# Patient Record
Sex: Female | Born: 1945
Health system: Southern US, Community
[De-identification: ages and names within clinical notes are randomized; demographics above are authoritative.]

## PROBLEM LIST (undated history)

## (undated) DIAGNOSIS — L509 Urticaria, unspecified: Secondary | ICD-10-CM

## (undated) DIAGNOSIS — F419 Anxiety disorder, unspecified: Secondary | ICD-10-CM

## (undated) DIAGNOSIS — M109 Gout, unspecified: Secondary | ICD-10-CM

## (undated) DIAGNOSIS — I10 Essential (primary) hypertension: Secondary | ICD-10-CM

## (undated) DIAGNOSIS — J45909 Unspecified asthma, uncomplicated: Secondary | ICD-10-CM

## (undated) DIAGNOSIS — M1990 Unspecified osteoarthritis, unspecified site: Secondary | ICD-10-CM

## (undated) DIAGNOSIS — K219 Gastro-esophageal reflux disease without esophagitis: Secondary | ICD-10-CM

## (undated) DIAGNOSIS — C801 Malignant (primary) neoplasm, unspecified: Secondary | ICD-10-CM

## (undated) DIAGNOSIS — T783XXA Angioneurotic edema, initial encounter: Secondary | ICD-10-CM

## (undated) HISTORY — PX: APPENDECTOMY: SHX54

## (undated) HISTORY — DX: Angioneurotic edema, initial encounter: T78.3XXA

## (undated) HISTORY — DX: Essential (primary) hypertension: I10

## (undated) HISTORY — PX: HEMORRHOID SURGERY: SHX153

## (undated) HISTORY — DX: Gastro-esophageal reflux disease without esophagitis: K21.9

## (undated) HISTORY — DX: Urticaria, unspecified: L50.9

## (undated) HISTORY — PX: BACK SURGERY: SHX140

## (undated) HISTORY — PX: ABDOMINAL HYSTERECTOMY: SHX81

## (undated) HISTORY — PX: BREAST CYST EXCISION: SHX579

---

## 2000-09-06 ENCOUNTER — Ambulatory Visit (HOSPITAL_COMMUNITY): Admission: RE | Admit: 2000-09-06 | Discharge: 2000-09-06 | Payer: Self-pay | Admitting: General Surgery

## 2001-03-24 ENCOUNTER — Encounter: Payer: Self-pay | Admitting: Family Medicine

## 2001-03-24 ENCOUNTER — Ambulatory Visit (HOSPITAL_COMMUNITY): Admission: RE | Admit: 2001-03-24 | Discharge: 2001-03-24 | Payer: Self-pay | Admitting: Family Medicine

## 2002-02-10 ENCOUNTER — Encounter (HOSPITAL_COMMUNITY): Admission: RE | Admit: 2002-02-10 | Discharge: 2002-03-12 | Payer: Self-pay | Admitting: Preventative Medicine

## 2002-06-05 ENCOUNTER — Emergency Department (HOSPITAL_COMMUNITY): Admission: EM | Admit: 2002-06-05 | Discharge: 2002-06-05 | Payer: Self-pay | Admitting: Emergency Medicine

## 2002-06-05 ENCOUNTER — Encounter: Payer: Self-pay | Admitting: *Deleted

## 2002-12-25 ENCOUNTER — Encounter: Payer: Self-pay | Admitting: Family Medicine

## 2002-12-25 ENCOUNTER — Ambulatory Visit (HOSPITAL_COMMUNITY): Admission: RE | Admit: 2002-12-25 | Discharge: 2002-12-25 | Payer: Self-pay | Admitting: Family Medicine

## 2003-01-25 ENCOUNTER — Ambulatory Visit (HOSPITAL_COMMUNITY): Admission: RE | Admit: 2003-01-25 | Discharge: 2003-01-25 | Payer: Self-pay | Admitting: Family Medicine

## 2003-01-25 ENCOUNTER — Encounter: Payer: Self-pay | Admitting: Family Medicine

## 2003-06-01 ENCOUNTER — Ambulatory Visit (HOSPITAL_COMMUNITY): Admission: RE | Admit: 2003-06-01 | Discharge: 2003-06-01 | Payer: Self-pay | Admitting: Family Medicine

## 2003-08-23 ENCOUNTER — Other Ambulatory Visit: Admission: RE | Admit: 2003-08-23 | Discharge: 2003-08-23 | Payer: Self-pay | Admitting: Dermatology

## 2004-01-26 ENCOUNTER — Ambulatory Visit (HOSPITAL_COMMUNITY): Admission: RE | Admit: 2004-01-26 | Discharge: 2004-01-26 | Payer: Self-pay | Admitting: Family Medicine

## 2004-09-07 ENCOUNTER — Other Ambulatory Visit: Admission: RE | Admit: 2004-09-07 | Discharge: 2004-09-07 | Payer: Self-pay | Admitting: Dermatology

## 2005-02-22 ENCOUNTER — Ambulatory Visit (HOSPITAL_COMMUNITY): Admission: RE | Admit: 2005-02-22 | Discharge: 2005-02-22 | Payer: Self-pay | Admitting: Family Medicine

## 2005-05-15 ENCOUNTER — Ambulatory Visit (HOSPITAL_COMMUNITY): Admission: RE | Admit: 2005-05-15 | Discharge: 2005-05-15 | Payer: Self-pay | Admitting: Family Medicine

## 2006-03-19 ENCOUNTER — Ambulatory Visit (HOSPITAL_COMMUNITY): Admission: RE | Admit: 2006-03-19 | Discharge: 2006-03-19 | Payer: Self-pay | Admitting: Internal Medicine

## 2007-09-04 ENCOUNTER — Ambulatory Visit: Payer: Self-pay | Admitting: Internal Medicine

## 2007-09-16 ENCOUNTER — Ambulatory Visit (HOSPITAL_COMMUNITY): Admission: RE | Admit: 2007-09-16 | Discharge: 2007-09-16 | Payer: Self-pay | Admitting: Internal Medicine

## 2007-09-16 ENCOUNTER — Ambulatory Visit: Payer: Self-pay | Admitting: Internal Medicine

## 2007-09-16 HISTORY — PX: COLONOSCOPY: SHX174

## 2007-09-16 HISTORY — PX: ESOPHAGOGASTRODUODENOSCOPY: SHX1529

## 2008-10-21 ENCOUNTER — Encounter: Payer: Self-pay | Admitting: Gastroenterology

## 2008-12-15 ENCOUNTER — Ambulatory Visit (HOSPITAL_COMMUNITY): Admission: RE | Admit: 2008-12-15 | Discharge: 2008-12-15 | Payer: Self-pay | Admitting: General Surgery

## 2008-12-15 ENCOUNTER — Encounter (INDEPENDENT_AMBULATORY_CARE_PROVIDER_SITE_OTHER): Payer: Self-pay | Admitting: General Surgery

## 2010-05-03 ENCOUNTER — Ambulatory Visit (HOSPITAL_COMMUNITY)
Admission: RE | Admit: 2010-05-03 | Discharge: 2010-05-03 | Payer: Self-pay | Source: Home / Self Care | Attending: Internal Medicine | Admitting: Internal Medicine

## 2010-05-04 ENCOUNTER — Ambulatory Visit (HOSPITAL_COMMUNITY)
Admission: RE | Admit: 2010-05-04 | Discharge: 2010-05-04 | Payer: Self-pay | Source: Home / Self Care | Attending: Internal Medicine | Admitting: Internal Medicine

## 2010-07-22 LAB — BASIC METABOLIC PANEL
BUN: 13 mg/dL (ref 6–23)
CO2: 29 mEq/L (ref 19–32)
Calcium: 9.7 mg/dL (ref 8.4–10.5)
Chloride: 103 mEq/L (ref 96–112)
Creatinine, Ser: 0.76 mg/dL (ref 0.4–1.2)
GFR calc Af Amer: >60 mL/min (ref >60)
GFR calc non Af Amer: >60 mL/min (ref >60)
Glucose, Bld: 98 mg/dL (ref 70–99)
Potassium: 4.4 mEq/L (ref 3.5–5.1)
Sodium: 140 mEq/L (ref 135–145)

## 2010-07-22 LAB — CBC
HCT: 36.1 % (ref 36.0–46.0)
Hemoglobin: 12.8 g/dL (ref 12.0–15.0)
MCHC: 35.5 g/dL (ref 30.0–36.0)
MCV: 83.8 fL (ref 78.0–100.0)
Platelets: 216 10*3/uL (ref 150–400)
RBC: 4.3 MIL/uL (ref 3.87–5.11)
RDW: 13.2 % (ref 11.5–15.5)
WBC: 8.2 10*3/uL (ref 4.0–10.5)

## 2010-08-29 NOTE — Op Note (Signed)
NAMEJAYLEAH, Kendra Orozco               ACCOUNT NO.:  [id number]   MEDICAL RECORD NO.:  [id number]          PATIENT TYPE:  AMB   LOCATION:  DAY                           FACILITY:  [facility]   PHYSICIAN:  Dalia Heading, M.D.  DATE OF BIRTH:  1945-10-10   DATE OF PROCEDURE:  12/15/2008  DATE OF DISCHARGE:  12/15/2008                               OPERATIVE REPORT   PREOPERATIVE DIAGNOSIS:  Internal and external hemorrhoids.   POSTOPERATIVE DIAGNOSIS:  Internal and external hemorrhoids.   PROCEDURE:  Extensive hemorrhoidectomy.   SURGEON:  Dalia Heading, MD   ANESTHESIA:  General.   INDICATIONS:  The patient is a 65 year old white female who presents  with an internal and external hemorrhoid.  The risks and benefits of the  procedure including bleeding, infection, and recurrence of the  hemorrhoids were fully explained to the patient, gave informed consent.   PROCEDURE NOTE:  The patient was placed in the lithotomy position after  general anesthesia was administered.  The perineum was prepped and  draped using the usual sterile technique with Betadine.  Surgical site  confirmation was performed.   On examination, the patient had an internal and external hemorrhoid at  the 7 o'clock position.  This was excised using the LigaSure without  difficulty.  It was sent to Pathology for further examination.  She also  had evidence of a sentinel tag at the 12 o'clock position from a  previous anal fissure.  This was fulgurated without difficulty.  No  other significant hemorrhoidal disease was noted.  Sensorcaine 0.5% was  instilled into the surrounding perineum.  Surgicel and viscous  Xylocaine, rectal packing was then placed.   All tape and needle counts were correct at the end of the procedure.  The patient was awakened and transferred to PACU in stable condition.   COMPLICATIONS:  None.   SPECIMEN:  Hemorrhoids.   BLOOD LOSS:  Minimal.      Dalia Heading, M.D.  Electronically  Signed     MAJ/MEDQ  D:  12/15/2008  T:  12/16/2008  Job:  [id number]   cc:   Angus G. Renard Matter, MD  Fax: [phone]

## 2010-08-29 NOTE — H&P (Signed)
Kendra Orozco, Kendra Orozco               ACCOUNT NO.:  [id number]   MEDICAL RECORD NO.:  [id number]          PATIENT TYPE:  AMB   LOCATION:  DAY                           FACILITY:  [facility]   PHYSICIAN:  Dalia Heading, M.D.  DATE OF BIRTH:  1945/04/18   DATE OF ADMISSION:  DATE OF DISCHARGE:  LH                              HISTORY & PHYSICAL   CHIEF COMPLAINT:  Bleeding hemorrhoids.   HISTORY OF PRESENT ILLNESS:  Patient is a 65 year old white female who  is referred for evaluation and treatment of a bleeding hemorrhoid.  It  has been bothering her recently.  Creams have not been helpful.  She had  a colonoscopy earlier this year which was reportedly negative.   PAST MEDICAL HISTORY:  Includes:  1. Extrinsic allergies.  2. Anxiety.  3. Hypertension.   PAST SURGICAL HISTORY:  1. Hemorrhoidectomy in 2002.  2. Back surgery.  3. Hysterectomy.   CURRENT MEDICATIONS:  1. Nasonex.  2. Singulair.  3. Xyzal.  4. Exforge/hydrochlorothiazide.  5. Lexapro.  6. Aciphex.  7. Symbicort.   ALLERGIES:  SULFA.   REVIEW OF SYSTEMS:  Noncontributory.   PHYSICAL EXAMINATION:  Patient is a well-developed, well-nourished,  white female in no acute distress.  LUNGS:  Clear to auscultation with equal breath sounds bilaterally.  HEART:  Reveals a regular rate and rhythm without S3, S4, or murmurs.  ABDOMEN:  Soft, nontender, nondistended.  No hepatosplenomegaly or  masses are noted.  RECTAL:  Reveals an internal and external hemorrhoid at the 7 o'clock  position.  It is not bleeding currently.   IMPRESSION:  Internal and external hemorrhoid.   PLAN:  The patient is scheduled for a hemorrhoidectomy on December 15, 2008.  The risks and benefits of the procedure including bleeding,  infection, pain, and recurrence of the hemorrhoids were fully explained  to the patient, gave informed consent.      Dalia Heading, M.D.  Electronically Signed     MAJ/MEDQ  D:  12/14/2008  T:  12/14/2008   Job:  [id number]   cc:   Short Stay, Je[facility] Hawking   [place] G. Renard Matter, MD  Fax: [phone]

## 2010-08-29 NOTE — Op Note (Signed)
NAME:  Kendra Orozco, Kendra Orozco               ACCOUNT NO.:  [id number]   MEDICAL RECORD NO.:  [id number]          PATIENT TYPE:  AMB   LOCATION:  DAY                           FACILITY:  [facility]   PHYSICIAN:  R. Roetta Sessions, M.D. DATE OF BIRTH:  Sep 06, 1945   DATE OF PROCEDURE:  09/16/2007  DATE OF DISCHARGE:                               OPERATIVE REPORT   INDICATIONS FOR PROCEDURE:  This is a 65 year old lady with 1-year  history of worsening indigestion/dyspepsia.  She takes nonsteroidals and  Zantac for gastroesophageal reflux disease.  No odynophagia no  dysphagia.  She also has intermittent low-volume hematochezia, positive  family history of colon cancer.  EGD and colonoscopy are now being done.  It is quite notable that when she was in the office on Sep 04, 2007, was  started on some Aciphex 20 mg orally daily.  This was associated with  marked dramatic improvement in her dyspepsia and reflux symptoms.  Risks, benefits, alternatives, and limitations have been reviewed.  All  questions answered.  All parties agreeable.  Please see documentation of  the medical record.   PROCEDURE NOTE:  O2 saturation, blood pressure, pulse, and respirations  were monitored throughout the entire procedure.  Conscious sedation  Versed 5 mg IV, Demerol 125 mg IV in divided doses.  Cetacaine spray for  topical pharyngeal anesthesia.   INSTRUMENT:  Pentax video chip system.   ESOPHAGOGASTRODUODENOSCOPY FINDINGS:  Examination of the tubular  esophagus revealed accentuated undulating Z-line, a couple of tiny  distal esophageal erosions.  There was no Barrett's esophagus or other  esophageal mucosal abnormalities.  EG junction easily traversed.  Stomach:  Gas cavity was empty and insufflated well with air.  The  examination of the gastric mucosa including retroflex view of the  proximal stomach and esophagogastric junction demonstrated a small  hiatal hernia, otherwise gastric mucosa appeared entirely normal.  Pylorus patent, easily traversed.  Examination of the bulb and the  second portion revealed no abnormalities.   THERAPEUTIC/DIAGNOSTIC MANEUVERS PERFORMED:  None.   The patient tolerated the procedure well and was reactive for  colonoscopy.  Digital rectal exam revealed no abnormalities.   ENDOSCOPIC FINDINGS:  The prep was adequate.  Colon:  Colonic mucosa was  surveyed from the rectosigmoid junction through the left transverse  right colon to the appendiceal orifice, ileocecal valve, and cecum.  These structures well seen and photographed for the record.  From this  level, the scope was slowly withdrawn.  All previously mentioned mucosal  surfaces were again seen.  The patient had narrow-mouth left-sided  diverticula and the colonic mucosa appeared normal.  Scope was pulled  down the rectum.  Further examination of the rectal mucosa including  retroflex view of the anal verge and also possible view of the anal  canal demonstrated some anal canal hemorrhoids only.  The patient  tolerated both procedures as well as reactive endoscopy.   IMPRESSION:  Accentuated undulating Z-line, tiny distal esophageal  erosions consistent with mild erosive reflux esophagitis, patulous EG  junction, small hiatal hernia, otherwise normal stomach, D1, and D2.   COLONOSCOPY  FINDINGS:  Anal canal hemorrhoids, otherwise normal rectum,  left-sided diverticula and colonic mucosa appeared normal.   RECOMMENDATIONS:  1. Continue Aciphex 20 mg orally daily.  Prescription given.      Literature on gastroesophageal flux disease provided to Ms. Casady.  2. Hemorrhoid and diverticulosis literature provided to Ms. Northrop.      She should take a daily fiber supplement.  3. 10-day course of Anusol HC suppositories one per rectum at bedtime.  4. Repeat colonoscopy in five years.      Jonathon Bellows, M.D.  Electronically Signed     RMR/MEDQ  D:  09/16/2007  T:  09/17/2007  Job:  [id number]

## 2010-08-29 NOTE — Consult Note (Signed)
NAME:  Kendra Orozco, Kendra Orozco               ACCOUNT NO.:  [id number]   MEDICAL RECORD NO.:  [id number]          PATIENT TYPE:  AMB   LOCATION:  DAY                           FACILITY:  [facility]   PHYSICIAN:  R. Roetta Sessions, M.D. DATE OF BIRTH:  05/04/1945   DATE OF CONSULTATION:  DATE OF DISCHARGE:                                 CONSULTATION   REQUESTING PHYSICIAN:  Dr. Margo Aye.   CONSULTING PHYSICIAN:  GI consult, Dr. Jena Gauss.   REASON FOR CONSULTATION:  Abdominal pain, alternating bowel movements,  intermittent hematochezia, and indigestion.   HISTORY OF PRESENT ILLNESS:  Kendra Orozco is a 65 year old female.  She  complains of constant indigestion as well as heartburn which is usually  worse at bedtime.  She describes the annoying in her epigastrium.  She  also complains of bilateral lower quadrant cramp-like pain.  She has  stools which alternate anywhere from too hard or too loose.  This has  been a problem for the last year.  She may have a bowel movement 4 or 5  times per day, but always has at least 1 bowel movement per day.  If she  does not have a bowel movement, then she takes stool softeners as well  as milk of magnesia to facilitate one.  She has intermittent small-  volume hematochezia.  She attributes this to hemorrhoids.  She describes  the bleeding as usually bright red.  Occasionally, she has seen darker  blood in her stools.  She feels as though eating makes the epigastric  pain better, but within a couple of minutes to hours after eating, her  pain is worse.  She has used Zantac 150 mg about every other day.  She  was on Prevacid 30 mg previously, but this became too expensive.  She  takes an occasional naproxen b.i.d. for arthritis, but does not admit to  regular use.   PAST MEDICAL AND SURGICAL HISTORY:  Seasonal allergies, osteoporosis,  hypertension, arthritis, GERD, and colonoscopy by Dr. Lovell Sheehan in 2003  which reportedly was normal.  She has had a complete hysterectomy  and  appendectomy.  She has had low back surgery and history of  hemorrhoidectomy.   CURRENT MEDICATIONS:  1. ProAir HFA p.r.n.  2. Nasonex p.r.n.  3. Symbicort 80/4.5 mcg p.r.n.  4. Singulair 10 mg 3 times per week.  5. Diovan and hydrochlorothiazide 320/12.5 mg daily.  6. Xyzal 5 mg 4 times a week.  7. Flexeril 10 mg nightly p.r.n.  8. Naproxen 500 mg b.i.d. p.r.n.  9. Multivitamin daily.  10.Calcium and vitamin D daily.  11.Tylenol p.r.n.  12.Zantac 150 mg p.r.n.  13.Stool softeners p.r.n.  14.Mucinex DM p.r.n.  15.Milk of magnesia p.r.n.   ALLERGIES:  SULFA and some ARTHRITIS MEDICINES (?).   FAMILY HISTORY:  Positive for maternal aunts, uncle, and brother with  colon cancer.  She tells me that her brother also has pancreatic cancer.  Mother's age is 73, has a history of polio as a child.  Father's age 54,  has a history of hypertension.  She has 4 four healthy siblings.  SOCIAL HISTORY:  Kendra Orozco is divorced.  She lives alone.  She is  retired from [employer].  She has a remote history of tobacco use.  She consumes a couple of alcoholic beverages per year.  She denies any  drug use.   REVIEW OF SYSTEMS:  See HPI, otherwise negative.   PHYSICAL EXAMINATION:  VITAL SIGNS:  Weight 108 pounds, height 64  inches, temperature 98 degrees, blood pressure 112/82, and pulse 72.  GENERAL:  Kendra Orozco is a well-developed, well-nourished Caucasian  female in no acute distress.  HEENT:  Sclerae nonicteric.  Conjunctivae pink.  Oropharynx pink and  moist without any lesions.  NECK:  Supple without evidence of thyromegaly.  CHEST:  Heart, regular rate and rhythm.  Normal S1 and S2 without  murmurs, clicks, rubs, or gallops.  LUNGS:  Clear to auscultation bilaterally.  ABDOMEN:  Positive bowel sounds x4.  No bruits auscultated.  Soft,  nontender, and nondistended without palpable mass or hepatosplenomegaly.  No rebound tenderness or guarding.  EXTREMITIES:  Without clubbing  or edema bilaterally.   IMPRESSION:  Kendra Orozco is a 65 year old female with 1-year history of  worsening indigestion and dyspepsia.  Her symptoms are worse  postprandially.  She has not been on proton pump inhibitor recently,  therefore this is going to need to be initiated.  Given her late onset  of severe indigestion, we would pursue esophagogastroduodenoscopy to  rule out occult malignancy.  If possible, we would be dealing with  peptic ulcer disease or gastritis as well.   Intermittent hematochezia and change in recent bowel habits warrants  further evaluation with colonoscopy to rule out colorectal carcinoma  given a strong family history and last colonoscopy was almost 5 years  ago.  She could have irritable bowel syndrome with benign anorectal  bleeding, however.   PLAN:  1. Aciphex 20 mg daily.  I have given her 2 weeks' worth of samples      with prescription for 31 and 2 refills.  2. Fiber supplements of choice.  I have given her samples of Benefiber      today.  3. Stool softeners p.r.n. for constipation.  4. Colonoscopy and EGD with Dr. Lorrin Goodell in the near future.  I      discussed the procedure including risks and benefits which include      but not limited to bleeding, infection, perforation, or drug      reaction.  She agrees with the      plan and consent will be obtained.  5. Stop Zantac.   Thank you Dr. Margo Aye for allowing [place] participating in the care of Ms.  Orozco.      Lorenza Burton, N.P.      Jonathon Bellows, M.D.  Electronically Signed    KJ/MEDQ  D:  09/04/2007  T:  09/05/2007  Job:  [id number]

## 2012-09-12 ENCOUNTER — Encounter: Payer: Self-pay | Admitting: Internal Medicine

## 2013-01-12 ENCOUNTER — Other Ambulatory Visit (HOSPITAL_COMMUNITY): Payer: Self-pay | Admitting: Internal Medicine

## 2013-01-12 DIAGNOSIS — Z139 Encounter for screening, unspecified: Secondary | ICD-10-CM

## 2013-01-19 ENCOUNTER — Ambulatory Visit (HOSPITAL_COMMUNITY)
Admission: RE | Admit: 2013-01-19 | Discharge: 2013-01-19 | Disposition: A | Discharge status: Home / Self Care | Payer: Medicare Other | Source: Ambulatory Visit | Attending: Internal Medicine | Admitting: Internal Medicine

## 2013-01-19 DIAGNOSIS — Z139 Encounter for screening, unspecified: Secondary | ICD-10-CM

## 2013-01-19 DIAGNOSIS — Z1231 Encounter for screening mammogram for malignant neoplasm of breast: Secondary | ICD-10-CM | POA: Insufficient documentation

## 2013-01-26 ENCOUNTER — Telehealth: Payer: Self-pay | Admitting: *Deleted

## 2013-01-26 NOTE — Telephone Encounter (Signed)
Pt called stating Dr. Margo Aye told her to make a appt. The next day we have is 10/29. Pt states she is having some bleeding it has been going on a week, pt states it is either from her hemorrhoids or she may have a small tear. Pt states the 29th is to far for her to be bleeding but if she has to take that appt. She will because the bleed comes when she goes to the bathroom pt states it is not pouring out. Please advise [phone]

## 2013-01-26 NOTE — Telephone Encounter (Signed)
Called pt. She said she has hemorrhoids and she passes a little bright red blood sometimes when she is on her feet. Nothing much. She is eating fiber and trying to keep up with BM's. Has one daily, but sometimes a little hard. The appt  On 29th is ok. Any recommendations?

## 2013-01-26 NOTE — Telephone Encounter (Signed)
Patient last seen in 2009. Does not seem to be urgent issue.  Doris please call patient and find out more information.

## 2013-01-27 NOTE — Telephone Encounter (Signed)
She can try Miralax OTC one capful at bedtimes on days without good BM. Keep OV.

## 2013-01-28 NOTE — Telephone Encounter (Signed)
Called and informed pt.  

## 2013-02-11 ENCOUNTER — Other Ambulatory Visit: Payer: Self-pay | Admitting: Internal Medicine

## 2013-02-11 ENCOUNTER — Ambulatory Visit (INDEPENDENT_AMBULATORY_CARE_PROVIDER_SITE_OTHER): Payer: Medicare Other | Admitting: Gastroenterology

## 2013-02-11 ENCOUNTER — Encounter: Payer: Self-pay | Admitting: Gastroenterology

## 2013-02-11 ENCOUNTER — Encounter (INDEPENDENT_AMBULATORY_CARE_PROVIDER_SITE_OTHER): Payer: Self-pay

## 2013-02-11 VITALS — BP 113/68 | HR 96 | Temp 97.4°F | Ht 63.5 in | Wt 190.8 lb

## 2013-02-11 DIAGNOSIS — K219 Gastro-esophageal reflux disease without esophagitis: Secondary | ICD-10-CM | POA: Insufficient documentation

## 2013-02-11 DIAGNOSIS — K625 Hemorrhage of anus and rectum: Secondary | ICD-10-CM

## 2013-02-11 MED ORDER — HYDROCORTISONE 2.5 % RE CREA: TOPICAL_CREAM | Freq: Two times a day (BID) | RECTAL | Status: DC

## 2013-02-11 MED ORDER — PEG 3350-KCL-NA BICARB-NACL 420 G PO SOLR: 4000.0000 mL | ORAL | Status: DC

## 2013-02-11 MED ORDER — LUBIPROSTONE 8 MCG PO CAPS: 8.0000 ug | ORAL_CAPSULE | Freq: Two times a day (BID) | ORAL | Status: DC

## 2013-02-11 NOTE — Patient Instructions (Addendum)
Start taking Amitiza 1 capsule each evening for three days with food. If you do well with this, increase to twice a day with food. This is to help with constipation.   I have sent a cream to your pharmacy for rectal discomfort. Use this twice a day for 7 days.   We have scheduled you for a colonoscopy with Dr. Jena Gauss in the near future.

## 2013-02-11 NOTE — Progress Notes (Signed)
Primary Care Physician:  Catalina Pizza, MD Primary Gastroenterologist:  Dr. Jena Gauss   Chief Complaint  Patient presents with  . Rectal Bleeding    HPI:   Kendra Orozco is a 67 year old female who presents today at the request of Dr. Catalina Pizza secondary to rectal bleeding. Last colonoscopy in 2009 with anal canal hemorrhoids, left-sided diverticula. Actually due for screening now, as she has a family history of colon cancer in first-degree relative at a young age. Last EGD in 2009 as well with mild erosive esophagitis.  States she had a bout of constipation then noted intermittent rectal bleeding. Off and on for the last 3 weeks but now seems to have tapered off. Much better in last 3 days. Dripped when sitting on toilet. Notes intermittent rectal discomfort with defecation. Trying to keep bowel movements soft. Quit eating red meat, which worsened constipation. Bowels don't want to "go down". Taking Miralax, occasional suppository. Rarely steroid cream for hemorrhoids. Takes about an hour and a half to have complete evacuation of bowel movement. Goes to bathroom, little bit comes out, then later the rest. Schedule has been off lately as she has been taking care of her dad. Feels like this is contributing to constipation. Aciphex controls GERD. Exacerbations only if medication is missed.   Past Medical History  Diagnosis Date  . GERD (gastroesophageal reflux disease)   . Hypertension     Past Surgical History  Procedure Laterality Date  . Esophagogastroduodenoscopy  09/16/2007    [id number] undulating Z-line, tiny distal esophageal  erosions consistent with mild erosive reflux esophagitis, patulous EG junction, small hiatal hernia, otherwise normal stomach, D1, and D2.  . Colonoscopy  09/16/2007    XBJ:YNWG canal hemorrhoids, otherwise normal rectum left-sided diverticula and colonic mucosa appeared normal.  . Hemorrhoid surgery      X 2   . Back surgery    . Abdominal hysterectomy     fibroid tumors    Current Outpatient Prescriptions  Medication Sig Dispense Refill  . cyclobenzaprine (FLEXERIL) 10 MG tablet Take 10 mg by mouth as needed for muscle spasms.      Marland Kitchen docusate sodium (COLACE) 100 MG capsule Take 100 mg by mouth as needed for constipation.      Marland Kitchen doxycycline (MONODOX) 100 MG capsule Take 100 mg by mouth 2 (two) times daily.       Marland Kitchen EXFORGE HCT 10-320-25 MG TABS Take by mouth daily.       Marland Kitchen levocetirizine (XYZAL) 5 MG tablet Take 5 mg by mouth as needed for allergies.      Marland Kitchen LORazepam (ATIVAN) 0.5 MG tablet Take 0.5 mg by mouth every 8 (eight) hours.      . polyethylene glycol (MIRALAX / GLYCOLAX) packet Take 17 g by mouth every other day.      . RABEprazole (ACIPHEX) 20 MG tablet Take 20 mg by mouth daily.        No current facility-administered medications for this visit.    Allergies as of 02/11/2013 - Review Complete 02/11/2013  Allergen Reaction Noted  . Sulfa antibiotics Rash 02/11/2013    Family History  Problem Relation Age of Onset  . Colon cancer Brother     35, deceased    History   Social History  . Marital Status: Divorced    Spouse Name: N/A    Number of Children: N/A  . Years of Education: N/A   Occupational History  . Not on file.   Social History Main Topics  .  Smoking status: Never Smoker   . Smokeless tobacco: Not on file  . Alcohol Use: No  . Drug Use: No  . Sexual Activity: Not on file   Other Topics Concern  . Not on file   Social History Narrative  . No narrative on file    Review of Systems: Gen: see HPI CV: Denies chest pain, heart palpitations, peripheral edema, syncope.  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  Physical Exam: BP  113/68  Pulse 96  Temp(Src) 97.4 F (36.3 C) (Oral)  Ht 5' 3.5" (1.613 m)  Wt 190 lb 12.8 oz (86.546 kg)  BMI 33.26 kg/m2 General:   Alert and oriented. Pleasant and cooperative. Well-nourished and well-developed.  Head:  Normocephalic and atraumatic. Eyes:  Without icterus, sclera clear and conjunctiva pink.  Ears:  Normal auditory acuity. Nose:  No deformity, discharge,  or lesions. Mouth:  No deformity or lesions, oral mucosa pink.  Neck:  Supple, without mass or thyromegaly. Lungs:  Clear to auscultation bilaterally. No wheezes, rales, or rhonchi. No distress.  Heart:  S1, S2 present without murmurs appreciated.  Abdomen:  +BS, soft, non-tender and non-distended. No HSM noted. No guarding or rebound. No masses appreciated.  Rectal:  External exam reveals external hemorrhoids, non-thrombosed. Internal exam with discomfort but no mass or stricture appreciated, likely internal hemorrhoids. No obvious anal fissure appreciated.  Msk:  Symmetrical without gross deformities. Normal posture. Extremities:  Without clubbing or edema. Neurologic:  Alert and  oriented x4;  grossly normal neurologically. Skin:  Intact without significant lesions or rashes. Cervical Nodes:  No significant cervical adenopathy. Psych:  Alert and cooperative. Normal mood and affect.

## 2013-02-11 NOTE — Assessment & Plan Note (Signed)
67 year old female with rectal bleeding in the setting of constipation, now tapering off at time of consultation. Associated rectal discomfort with defecation. Rectal exam with non-thrombosed hemorrhoids but no appreciable anal fissure; however, question occult anal fissure, benign etiology as likely culprit. Last TCS in 2009 normal, but she has a positive family history of colon cancer in first degree relative at a young age. Due for screening now.   Proceed with TCS with Dr. Jena Gauss in near future: the risks, benefits, and alternatives have been discussed with the patient in detail. The patient states understanding and desires to proceed. Proctosol BID X 7 days Amitiza 8 mcg po BID, samples provided.

## 2013-02-11 NOTE — Assessment & Plan Note (Signed)
Controlled with Aciphex daily. No concerning symptoms. Continue Aciphex.

## 2013-02-16 ENCOUNTER — Encounter (HOSPITAL_COMMUNITY): Payer: Self-pay | Admitting: Pharmacy Technician

## 2013-02-16 NOTE — Progress Notes (Signed)
cc'd to pcp 

## 2013-03-02 ENCOUNTER — Encounter (HOSPITAL_COMMUNITY)
Admission: RE | Disposition: A | Discharge status: Home / Self Care | Payer: Self-pay | Source: Ambulatory Visit | Attending: Internal Medicine

## 2013-03-02 ENCOUNTER — Ambulatory Visit (HOSPITAL_COMMUNITY)
Admission: RE | Admit: 2013-03-02 | Discharge: 2013-03-02 | Disposition: A | Discharge status: Home / Self Care | Payer: Medicare Other | Source: Ambulatory Visit | Attending: Internal Medicine | Admitting: Internal Medicine

## 2013-03-02 ENCOUNTER — Encounter (HOSPITAL_COMMUNITY): Payer: Self-pay | Admitting: *Deleted

## 2013-03-02 DIAGNOSIS — D126 Benign neoplasm of colon, unspecified: Secondary | ICD-10-CM | POA: Insufficient documentation

## 2013-03-02 DIAGNOSIS — K921 Melena: Secondary | ICD-10-CM | POA: Insufficient documentation

## 2013-03-02 DIAGNOSIS — K625 Hemorrhage of anus and rectum: Secondary | ICD-10-CM

## 2013-03-02 DIAGNOSIS — K648 Other hemorrhoids: Secondary | ICD-10-CM | POA: Insufficient documentation

## 2013-03-02 DIAGNOSIS — K5730 Diverticulosis of large intestine without perforation or abscess without bleeding: Secondary | ICD-10-CM

## 2013-03-02 DIAGNOSIS — I10 Essential (primary) hypertension: Secondary | ICD-10-CM | POA: Insufficient documentation

## 2013-03-02 DIAGNOSIS — Z800 Family history of malignant neoplasm of digestive organs: Secondary | ICD-10-CM | POA: Insufficient documentation

## 2013-03-02 HISTORY — PX: COLONOSCOPY: SHX5424

## 2013-03-02 SURGERY — COLONOSCOPY
Anesthesia: Moderate Sedation

## 2013-03-02 MED ORDER — MIDAZOLAM HCL 5 MG/5ML IJ SOLN
INTRAMUSCULAR | Status: AC
  Filled 2013-03-02: qty 10

## 2013-03-02 MED ORDER — STERILE WATER FOR IRRIGATION IR SOLN
Status: DC | PRN
  Administered 2013-03-02: 13:00:00

## 2013-03-02 MED ORDER — SODIUM CHLORIDE 0.9 % IV SOLN
INTRAVENOUS | Status: DC
  Administered 2013-03-02: 12:00:00 via INTRAVENOUS

## 2013-03-02 MED ORDER — MEPERIDINE HCL 100 MG/ML IJ SOLN
INTRAMUSCULAR | Status: AC
  Filled 2013-03-02: qty 2

## 2013-03-02 MED ORDER — ONDANSETRON HCL 4 MG/2ML IJ SOLN
INTRAMUSCULAR | Status: AC
  Filled 2013-03-02: qty 2

## 2013-03-02 MED ORDER — ONDANSETRON HCL 4 MG/2ML IJ SOLN
INTRAMUSCULAR | Status: DC | PRN
  Administered 2013-03-02: 4 mg via INTRAVENOUS

## 2013-03-02 MED ORDER — MIDAZOLAM HCL 5 MG/5ML IJ SOLN
INTRAMUSCULAR | Status: DC | PRN
  Administered 2013-03-02 (×3): 1 mg via INTRAVENOUS
  Administered 2013-03-02 (×2): 2 mg via INTRAVENOUS

## 2013-03-02 MED ORDER — MEPERIDINE HCL 100 MG/ML IJ SOLN
INTRAMUSCULAR | Status: DC | PRN
  Administered 2013-03-02: 50 mg via INTRAVENOUS
  Administered 2013-03-02 (×2): 25 mg via INTRAVENOUS

## 2013-03-02 NOTE — Interval H&P Note (Signed)
History and Physical Interval Note:  03/02/2013 12:51 PM  Kendra Orozco  has presented today for surgery, with the diagnosis of RECTAL BLEEDING  The various methods of treatment have been discussed with the patient and family. After consideration of risks, benefits and other options for treatment, the patient has consented to  Procedure(s) with comments: COLONOSCOPY (N/A) - 1:00 as a surgical intervention .  The patient's history has been reviewed, patient examined, no change in status, stable for surgery.  I have reviewed the patient's chart and labs.  Questions were answered to the patient's satisfaction.    Amitiza helping constipation tremendously. Colonoscopy per plan.  The risks, benefits, limitations, alternatives and imponderables have been reviewed with the patient. Questions have been answered. All parties are agreeable.   Eula Listen

## 2013-03-02 NOTE — Op Note (Signed)
[facility] [address] [place], [address]   COLONOSCOPY PROCEDURE REPORT  PATIENT: Kendra Orozco, Kendra Orozco.  MR#:         [id number] BIRTHDATE: 08-13-1945 , 67  yrs. old GENDER: Female ENDOSCOPIST: R.  Roetta Sessions, MD FACP FACG REFERRED BY:  Catalina Pizza, M.D. PROCEDURE DATE:  03/02/2013 PROCEDURE:     Colonoscopy with snare polypectomy  INDICATIONS: hematochezia; positive family history of colon cancer  INFORMED CONSENT:  The risks, benefits, alternatives and imponderables including but not limited to bleeding, perforation as well as the possibility of a missed lesion have been reviewed.  The potential for biopsy, lesion removal, etc. have also been discussed.  Questions have been answered.  All parties agreeable. Please see the history and physical in the medical record for more information.  MEDICATIONS: Versed 7 mg IV and Demerol 100 mg IV in divided doses. Zofran 4 mg IV.  DESCRIPTION OF PROCEDURE:  After a digital rectal exam was performed, the EC-[id number] ([id number])  colonoscope was advanced from the anus through the rectum and colon to the area of the cecum, ileocecal valve and appendiceal orifice.  The cecum was deeply intubated.  These structures were well-seen and photographed for the record.  From the level of the cecum and ileocecal valve, the scope was slowly and cautiously withdrawn.  The mucosal surfaces were carefully surveyed utilizing scope tip deflection to facilitate fold flattening as needed.  The scope was pulled down into the rectum where a thorough examination including retroflexion was performed.    FINDINGS:  Adequate preparation. Anal canal hemorrhoids; otherwise, normal rectum. Scattered pancolonic diverticula;(1)  7 mm sessile polyp just distal to the ileocecal valve; otherwise, the remainder of the colonic mucosa appeared. Normal.  THERAPEUTIC / DIAGNOSTIC MANEUVERS PERFORMED:  The above-mentioned polyp was hot snare removed and  recovered for the pathologist.  COMPLICATIONS: None  CECAL WITHDRAWAL TIME:  15 minutes  IMPRESSION:  Colon polyp-removed as described above. Colonic diverticulosis. Anal canal hemorrhoids-likely source of hematochezia  RECOMMENDATIONS: Continue previously recommended plan for constipation. Add Benefiber 2 teaspoons twice daily. Followup on pathology. Office visit in 6 weeks to discuss hemorrhoid banding.   _______________________________ eSigned:  R. Roetta Sessions, MD FACP [facility] 03/02/2013 1:39 PM   CC:

## 2013-03-02 NOTE — H&P (View-Only) (Signed)
 Primary Care Physician:  HALL, ZACH, MD Primary Gastroenterologist:  Dr. Rourk   Chief Complaint  Patient presents with  . Rectal Bleeding    HPI:   Ms. Kendra Orozco is a 67-year-old female who presents today at the request of Dr. Zach Hall secondary to rectal bleeding. Last colonoscopy in 2009 with anal canal hemorrhoids, left-sided diverticula. Actually due for screening now, as she has a family history of colon cancer in first-degree relative at a young age. Last EGD in 2009 as well with mild erosive esophagitis.  States she had a bout of constipation then noted intermittent rectal bleeding. Off and on for the last 3 weeks but now seems to have tapered off. Much better in last 3 days. Dripped when sitting on toilet. Notes intermittent rectal discomfort with defecation. Trying to keep bowel movements soft. Quit eating red meat, which worsened constipation. Bowels don't want to "go down". Taking Miralax, occasional suppository. Rarely steroid cream for hemorrhoids. Takes about an hour and a half to have complete evacuation of bowel movement. Goes to bathroom, little bit comes out, then later the rest. Schedule has been off lately as she has been taking care of her dad. Feels like this is contributing to constipation. Aciphex controls GERD. Exacerbations only if medication is missed.   Past Medical History  Diagnosis Date  . GERD (gastroesophageal reflux disease)   . Hypertension     Past Surgical History  Procedure Laterality Date  . Esophagogastroduodenoscopy  09/16/2007    RMR:Accentuated undulating Z-line, tiny distal esophageal  erosions consistent with mild erosive reflux esophagitis, patulous EG junction, small hiatal hernia, otherwise normal stomach, D1, and D2.  . Colonoscopy  09/16/2007    RMR:Anal canal hemorrhoids, otherwise normal rectum left-sided diverticula and colonic mucosa appeared normal.  . Hemorrhoid surgery      X 2   . Back surgery    . Abdominal hysterectomy     fibroid tumors    Current Outpatient Prescriptions  Medication Sig Dispense Refill  . cyclobenzaprine (FLEXERIL) 10 MG tablet Take 10 mg by mouth as needed for muscle spasms.      . docusate sodium (COLACE) 100 MG capsule Take 100 mg by mouth as needed for constipation.      . doxycycline (MONODOX) 100 MG capsule Take 100 mg by mouth 2 (two) times daily.       . EXFORGE HCT 10-320-25 MG TABS Take by mouth daily.       . levocetirizine (XYZAL) 5 MG tablet Take 5 mg by mouth as needed for allergies.      . LORazepam (ATIVAN) 0.5 MG tablet Take 0.5 mg by mouth every 8 (eight) hours.      . polyethylene glycol (MIRALAX / GLYCOLAX) packet Take 17 g by mouth every other day.      . RABEprazole (ACIPHEX) 20 MG tablet Take 20 mg by mouth daily.        No current facility-administered medications for this visit.    Allergies as of 02/11/2013 - Review Complete 02/11/2013  Allergen Reaction Noted  . Sulfa antibiotics Rash 02/11/2013    Family History  Problem Relation Age of Onset  . Colon cancer Brother     45, deceased    History   Social History  . Marital Status: Divorced    Spouse Name: N/A    Number of Children: N/A  . Years of Education: N/A   Occupational History  . Not on file.   Social History Main Topics  .   Smoking status: Never Smoker   . Smokeless tobacco: Not on file  . Alcohol Use: No  . Drug Use: No  . Sexual Activity: Not on file   Other Topics Concern  . Not on file   Social History Narrative  . No narrative on file    Review of Systems: Gen: see HPI CV: Denies chest pain, heart palpitations, peripheral edema, syncope.  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  Physical Exam: BP  113/68  Pulse 96  Temp(Src) 97.4 F (36.3 C) (Oral)  Ht 5' 3.5" (1.613 m)  Wt 190 lb 12.8 oz (86.546 kg)  BMI 33.26 kg/m2 General:   Alert and oriented. Pleasant and cooperative. Well-nourished and well-developed.  Head:  Normocephalic and atraumatic. Eyes:  Without icterus, sclera clear and conjunctiva pink.  Ears:  Normal auditory acuity. Nose:  No deformity, discharge,  or lesions. Mouth:  No deformity or lesions, oral mucosa pink.  Neck:  Supple, without mass or thyromegaly. Lungs:  Clear to auscultation bilaterally. No wheezes, rales, or rhonchi. No distress.  Heart:  S1, S2 present without murmurs appreciated.  Abdomen:  +BS, soft, non-tender and non-distended. No HSM noted. No guarding or rebound. No masses appreciated.  Rectal:  External exam reveals external hemorrhoids, non-thrombosed. Internal exam with discomfort but no mass or stricture appreciated, likely internal hemorrhoids. No obvious anal fissure appreciated.  Msk:  Symmetrical without gross deformities. Normal posture. Extremities:  Without clubbing or edema. Neurologic:  Alert and  oriented x4;  grossly normal neurologically. Skin:  Intact without significant lesions or rashes. Cervical Nodes:  No significant cervical adenopathy. Psych:  Alert and cooperative. Normal mood and affect.    

## 2013-03-04 ENCOUNTER — Encounter: Payer: Self-pay | Admitting: Internal Medicine

## 2013-03-10 ENCOUNTER — Encounter (HOSPITAL_COMMUNITY): Payer: Self-pay | Admitting: Internal Medicine

## 2013-04-14 ENCOUNTER — Encounter (INDEPENDENT_AMBULATORY_CARE_PROVIDER_SITE_OTHER): Payer: Self-pay

## 2013-04-14 ENCOUNTER — Encounter: Payer: Self-pay | Admitting: Gastroenterology

## 2013-04-14 ENCOUNTER — Ambulatory Visit (INDEPENDENT_AMBULATORY_CARE_PROVIDER_SITE_OTHER): Payer: Medicare Other | Admitting: Gastroenterology

## 2013-04-14 VITALS — BP 125/78 | HR 91 | Temp 97.6°F | Ht 62.0 in | Wt 194.0 lb

## 2013-04-14 DIAGNOSIS — K5900 Constipation, unspecified: Secondary | ICD-10-CM

## 2013-04-14 DIAGNOSIS — K625 Hemorrhage of anus and rectum: Secondary | ICD-10-CM

## 2013-04-14 MED ORDER — HYDROCORTISONE 2.5 % RE CREA: 1.0000 "application " | TOPICAL_CREAM | Freq: Two times a day (BID) | RECTAL | Status: DC

## 2013-04-14 MED ORDER — LUBIPROSTONE 24 MCG PO CAPS: 24.0000 ug | ORAL_CAPSULE | Freq: Two times a day (BID) | ORAL | Status: DC

## 2013-04-14 NOTE — Progress Notes (Signed)
Referring Provider: Catalina Pizza, MD Primary Care Physician:  Catalina Pizza, MD Primary GI: Dr. Jena Gauss   Chief Complaint  Patient presents with  . Advice Only    HPI:   Kendra Orozco returns today in follow-up after colonoscopy in Nov 2014. Due for surveillance again in 2019. Here to discuss possible hemorrhoid banding. One episode of bleeding since last colonoscopy. States will have rectal burning/raw feeling. Will use hemorrhoid cream. Stool not productive. Has to go multiple times in a few hours to be completely evacuated. Says it's hard to get it to come out, like "my muscles" don't let it. Had tapered down to only one 8 mcg Amitiza daily as she was running out and finances were tight. Now back up to twice a day. Felt less productive with once a day.   Past Medical History  Diagnosis Date  . GERD (gastroesophageal reflux disease)   . Hypertension     Past Surgical History  Procedure Laterality Date  . Esophagogastroduodenoscopy  09/16/2007    ZOX:[id number] undulating Z-line, tiny distal esophageal  erosions consistent with mild erosive reflux esophagitis, patulous EG junction, small hiatal hernia, otherwise normal stomach, D1, and D2.  . Colonoscopy  09/16/2007    XBJ:YNWG canal hemorrhoids, otherwise normal rectum left-sided diverticula and colonic mucosa appeared normal.  . Hemorrhoid surgery      X 2   . Back surgery    . Abdominal hysterectomy      fibroid tumors  . Colonoscopy N/A 03/02/2013    Dr. Allayne Butcher adenoma. Colonic diverticulosis. Anal canal hemorrhoids-likely source of hematochezia. Surveillance 2019    Current Outpatient Prescriptions  Medication Sig Dispense Refill  . calcium carbonate (OS-CAL) 1250 MG chewable tablet Chew 1 tablet by mouth daily.      . Cholecalciferol ([employer] VIT D 5000 HIGH-POTENCY) 5000 UNITS capsule Take 5,000 Units by mouth every other day.      . cyclobenzaprine (FLEXERIL) 10 MG tablet Take 10 mg by mouth as needed for muscle  spasms.      Marland Kitchen docusate sodium (COLACE) 100 MG capsule Take 100 mg by mouth as needed for constipation.      Marland Kitchen doxycycline (MONODOX) 100 MG capsule Take 100 mg by mouth 2 (two) times daily.       Marland Kitchen EXFORGE HCT 10-320-25 MG TABS Take by mouth daily.       Marland Kitchen ibuprofen (ADVIL,MOTRIN) 200 MG tablet Take 800 mg by mouth every 8 (eight) hours as needed for pain.      Marland Kitchen levocetirizine (XYZAL) 5 MG tablet Take 5 mg by mouth as needed for allergies.      Marland Kitchen LORazepam (ATIVAN) 0.5 MG tablet Take 0.5 mg by mouth every 8 (eight) hours as needed.       . lubiprostone (AMITIZA) 8 MCG capsule Take 1 capsule (8 mcg total) by mouth 2 (two) times daily with a meal.  60 capsule  3  . Multiple Vitamin (MULTIVITAMIN WITH MINERALS) TABS tablet Take 1 tablet by mouth daily.      . RABEprazole (ACIPHEX) 20 MG tablet Take 20 mg by mouth daily.       . hydrocortisone (PROCTOZONE-HC) 2.5 % rectal cream Place 1 application rectally 2 (two) times daily.  30 g  0  . lubiprostone (AMITIZA) 24 MCG capsule Take 1 capsule (24 mcg total) by mouth 2 (two) times daily with a meal.  60 capsule  3   No current facility-administered medications for this visit.  Allergies as of 04/14/2013 - Review Complete 04/14/2013  Allergen Reaction Noted  . Latex  02/16/2013  . Sulfa antibiotics Rash 02/11/2013    Family History  Problem Relation Age of Onset  . Colon cancer Brother     30, deceased    History   Social History  . Marital Status: Divorced    Spouse Name: N/A    Number of Children: N/A  . Years of Education: N/A   Social History Main Topics  . Smoking status: Never Smoker   . Smokeless tobacco: None  . Alcohol Use: No  . Drug Use: No  . Sexual Activity: None   Other Topics Concern  . None   Social History Narrative  . None    Review of Systems: As mentioned in HPI.   Physical Exam: BP 125/78  Pulse 91  Temp(Src) 97.6 F (36.4 C) (Oral)  Ht 5\' 2"  (1.575 m)  Wt 194 lb (87.998 kg)  BMI 35.47  kg/m2 General:   Alert and oriented. No distress noted. Pleasant and cooperative.  Head:  Normocephalic and atraumatic. Eyes:  Conjuctiva clear without scleral icterus. Heart:  S1, S2 present without murmurs, rubs, or gallops. Regular rate and rhythm. Abdomen:  +BS, soft, non-tender and non-distended. No rebound or guarding. No HSM or masses noted. Msk:  Symmetrical without gross deformities. Normal posture. Extremities:  Without edema. Neurologic:  Alert and  oriented x4;  grossly normal neurologically. Psych:  Alert and cooperative. Normal mood and affect.

## 2013-04-14 NOTE — Patient Instructions (Signed)
I have provided samples of Amitiza at a higher dosage. This is 24 mcg gel caps. Take 1 each morning with food. You can increase to twice a day if needed.   We will see you back in 3 months!  I sent the cream to your pharmacy.

## 2013-04-15 DIAGNOSIS — K5900 Constipation, unspecified: Secondary | ICD-10-CM | POA: Insufficient documentation

## 2013-04-15 NOTE — Assessment & Plan Note (Signed)
Secondary to internal hemorrhoids in the setting of constipation. Colonoscopy up-to-date and due again in 2019 due to family history of colon cancer and personal history of polyps. Patient hesitant to pursue hemorrhoid banding currently. Will provide cream in interim. Patient to return in 3 months.

## 2013-04-15 NOTE — Assessment & Plan Note (Signed)
Increase Amitiza to 24 mcg daily to BID. Return in 3 months.

## 2013-04-17 NOTE — Progress Notes (Signed)
cc'd to pcp 

## 2013-07-07 ENCOUNTER — Encounter: Payer: Self-pay | Admitting: Gastroenterology

## 2013-07-07 ENCOUNTER — Encounter (INDEPENDENT_AMBULATORY_CARE_PROVIDER_SITE_OTHER): Payer: Self-pay

## 2013-07-07 ENCOUNTER — Ambulatory Visit (INDEPENDENT_AMBULATORY_CARE_PROVIDER_SITE_OTHER): Payer: Medicare Other | Admitting: Gastroenterology

## 2013-07-07 VITALS — BP 123/77 | HR 70 | Temp 97.8°F | Ht 64.0 in | Wt 189.8 lb

## 2013-07-07 DIAGNOSIS — K5900 Constipation, unspecified: Secondary | ICD-10-CM

## 2013-07-07 MED ORDER — LUBIPROSTONE 24 MCG PO CAPS: 24.0000 ug | ORAL_CAPSULE | Freq: Two times a day (BID) | ORAL | Status: DC

## 2013-07-07 NOTE — Patient Instructions (Signed)
Try taking Amitiza 24 mcg by mouth with food twice a day. If this does not work, you may decrease this to once per day with food.   We will see you back in 6 months!

## 2013-07-07 NOTE — Assessment & Plan Note (Signed)
68 year old female with chronic constipation, much improved on Amitiza 24 mcg daily. Seems to have an element of pelvic floor dysfunction; no further rectal bleeding since constipation improved. Consider banding in future if patient desires. In interim, increase Amitiza to BID if tolerated. Next surveillance colonoscopy 2019. Return in 6 months.

## 2013-07-07 NOTE — Progress Notes (Signed)
Referring Provider: Delphina Cahill, MD Primary Care Physician:  Delphina Cahill, MD Primary GI: Dr. Gala Romney  Chief Complaint  Patient presents with  . Follow-up    HPI:   Kendra Orozco presents today in follow-up secondary to history of constipation and low-volume hematochezia, hesitant to pursue banding at last appointment. Amitiza increased to 24 mcg BID at last visit. Colonoscopy up to date. States she actually went back to Amitiza 24 mcg just daily. This works better than 48mcg BID. If needed will intervene with Colace. Maybe 3 times in the past 3 months. Hasn't had any issues with bleeding since adjusting Amitiza dosage. Sometimes has to use a suppository to stimulate to go, soft BM, just hard to get out. Has done a suppository about 6 times in past 3 months. Thinks diet has a lot to do with it. Trying to eat high fiber diet. Raisin bran cereal helps. Appetite not that great. If doesn't have a BM in the morning, ill as a hornet and doesn't feel like eating.   Past Medical History  Diagnosis Date  . GERD (gastroesophageal reflux disease)   . Hypertension     Past Surgical History  Procedure Laterality Date  . Esophagogastroduodenoscopy  09/16/2007    LKG:[id number] undulating Z-line, tiny distal esophageal  erosions consistent with mild erosive reflux esophagitis, patulous EG junction, small hiatal hernia, otherwise normal stomach, D1, and D2.  . Colonoscopy  09/16/2007    YQI:HKVQ canal hemorrhoids, otherwise normal rectum left-sided diverticula and colonic mucosa appeared normal.  . Hemorrhoid surgery      X 2   . Back surgery    . Abdominal hysterectomy      fibroid tumors  . Colonoscopy N/A 03/02/2013    Dr. Chauncy Lean adenoma. Colonic diverticulosis. Anal canal hemorrhoids-likely source of hematochezia. Surveillance 2019    Current Outpatient Prescriptions  Medication Sig Dispense Refill  . calcium carbonate (OS-CAL) 1250 MG chewable tablet Chew 1 tablet by mouth daily.       . Cholecalciferol ([employer]S VIT D 5000 HIGH-POTENCY) 5000 UNITS capsule Take 5,000 Units by mouth every other day.      . cyclobenzaprine (FLEXERIL) 10 MG tablet Take 10 mg by mouth as needed for muscle spasms.      Marland Kitchen docusate sodium (COLACE) 100 MG capsule Take 100 mg by mouth as needed for constipation.      Marland Kitchen doxycycline (MONODOX) 100 MG capsule Take 100 mg by mouth 2 (two) times daily.       Marland Kitchen EXFORGE HCT 10-320-25 MG TABS Take by mouth daily.       . hydrocortisone (PROCTOZONE-HC) 2.5 % rectal cream Place 1 application rectally 2 (two) times daily.  30 g  0  . ibuprofen (ADVIL,MOTRIN) 200 MG tablet Take 800 mg by mouth every 8 (eight) hours as needed for pain.      Marland Kitchen levocetirizine (XYZAL) 5 MG tablet Take 5 mg by mouth as needed for allergies.      Marland Kitchen LORazepam (ATIVAN) 0.5 MG tablet Take 0.5 mg by mouth every 8 (eight) hours as needed.       . lubiprostone (AMITIZA) 24 MCG capsule Take 1 capsule (24 mcg total) by mouth 2 (two) times daily with a meal.  60 capsule  11  . Multiple Vitamin (MULTIVITAMIN WITH MINERALS) TABS tablet Take 1 tablet by mouth daily.      . RABEprazole (ACIPHEX) 20 MG tablet Take 20 mg by mouth daily.        No  current facility-administered medications for this visit.    Allergies as of 07/07/2013 - Review Complete 07/07/2013  Allergen Reaction Noted  . Latex  02/16/2013  . Sulfa antibiotics Rash 02/11/2013    Family History  Problem Relation Age of Onset  . Colon cancer Brother     3, deceased    History   Social History  . Marital Status: Divorced    Spouse Name: N/A    Number of Children: N/A  . Years of Education: N/A   Social History Main Topics  . Smoking status: Never Smoker   . Smokeless tobacco: None  . Alcohol Use: No  . Drug Use: No  . Sexual Activity: None   Other Topics Concern  . None   Social History Narrative  . None    Review of Systems: As mentioned in HPI.   Physical Exam: BP 123/77  Pulse 70  Temp(Src) 97.8 F  (36.6 C) (Oral)  Ht 5\' 4"  (1.626 m)  Wt 189 lb 12.8 oz (86.093 kg)  BMI 32.56 kg/m2 General:   Alert and oriented. No distress noted. Pleasant and cooperative.  Head:  Normocephalic and atraumatic. Eyes:  Conjuctiva clear without scleral icterus. Mouth:  Oral mucosa pink and moist. Good dentition. No lesions. Abdomen:  +BS, soft, non-tender and non-distended. No rebound or guarding. No HSM or masses noted. Msk:  Symmetrical without gross deformities. Normal posture. Extremities:  Without edema. Neurologic:  Alert and  oriented x4;  grossly normal neurologically. Skin:  Intact without significant lesions or rashes. Psych:  Alert and cooperative. Normal mood and affect.

## 2013-07-08 NOTE — Progress Notes (Signed)
cc'd to pcp 

## 2013-12-16 ENCOUNTER — Encounter: Payer: Self-pay | Admitting: Internal Medicine

## 2014-03-17 ENCOUNTER — Telehealth: Payer: Self-pay | Admitting: Internal Medicine

## 2014-03-17 ENCOUNTER — Encounter: Payer: Self-pay | Admitting: Internal Medicine

## 2014-03-17 NOTE — Telephone Encounter (Signed)
Pt should have had a follow up visit in September. She was mailed a letter to call and schedule but did not call. Vicente Males is it ok to give her samples?

## 2014-03-17 NOTE — Telephone Encounter (Signed)
PATIENT INSURANCE WILL NOT REFILL AMITIZA 24 MCG UNTIL AFTER FIRST OF THE YEAR    ARE THERE ANY SAMPLES SHE CAN HAVE  PLEASE CALL HER AT [phone]

## 2014-03-18 NOTE — Telephone Encounter (Signed)
Provide samples but then make sure she knows she needs an appt.

## 2014-03-23 NOTE — Telephone Encounter (Signed)
We do not have any amitiza 32mcg at this time. Pt may call back next week so see if we got any in.  Also please make sure she has an appointment. Thanks.

## 2014-03-24 NOTE — Telephone Encounter (Signed)
Patient has appointment 04/30/14

## 2014-04-06 ENCOUNTER — Telehealth: Payer: Self-pay

## 2014-04-06 NOTE — Telephone Encounter (Signed)
[employer] called this am and pts Amitiza has been approved and it is good from 04/06/2014-04/07/2015.

## 2014-04-06 NOTE — Telephone Encounter (Signed)
See note

## 2014-04-14 ENCOUNTER — Telehealth: Payer: Self-pay

## 2014-04-14 NOTE — Telephone Encounter (Signed)
amitiza 77mcg was approved for 1 year by [facility] [place]. Pharmacy is aware.

## 2014-04-30 ENCOUNTER — Encounter: Payer: Self-pay | Admitting: Gastroenterology

## 2014-04-30 ENCOUNTER — Ambulatory Visit (INDEPENDENT_AMBULATORY_CARE_PROVIDER_SITE_OTHER): Payer: Medicare Other | Admitting: Gastroenterology

## 2014-04-30 VITALS — BP 120/73 | HR 73 | Temp 97.6°F | Ht 64.0 in | Wt 192.0 lb

## 2014-04-30 DIAGNOSIS — K5900 Constipation, unspecified: Secondary | ICD-10-CM

## 2014-04-30 NOTE — Progress Notes (Signed)
Referring Provider: Delphina Cahill, MD Primary Care Physician:  Delphina Cahill, MD  Primary GI: Dr. Gala Romney   Chief Complaint  Patient presents with  . Medication Refill    HPI:   Kendra Orozco is a 69 y.o. female presenting today with a history of constipation and GERD. Last seen March 2015. Colonoscopy up to date as of 2014 and due for surveillance again in 2019. Amitiza 24 mcg once to BID at last appt. States she had a rough 2 months previously due to going in the donut hole. Noted that she was taking Amitiza 24 mcg in the morning and an 8 mcg Amitiza at night. Noted some low volume hematochezia when constipated but now resolved with getting back on Amitiza. Feels like Amitiza 24 mcg in morning and 8 mcg in the evening works well for her.   Past Medical History  Diagnosis Date  . GERD (gastroesophageal reflux disease)   . Hypertension     Past Surgical History  Procedure Laterality Date  . Esophagogastroduodenoscopy  09/16/2007    CVE:[id number] undulating Z-line, tiny distal esophageal  erosions consistent with mild erosive reflux esophagitis, patulous EG junction, small hiatal hernia, otherwise normal stomach, D1, and D2.  . Colonoscopy  09/16/2007    HEN:IDPO canal hemorrhoids, otherwise normal rectum left-sided diverticula and colonic mucosa appeared normal.  . Hemorrhoid surgery      X 2   . Back surgery    . Abdominal hysterectomy      fibroid tumors  . Colonoscopy N/A 03/02/2013    Dr. Chauncy Lean adenoma. Colonic diverticulosis. Anal canal hemorrhoids-likely source of hematochezia. Surveillance 2019    Current Outpatient Prescriptions  Medication Sig Dispense Refill  . calcium carbonate (OS-CAL) 1250 MG chewable tablet Chew 1 tablet by mouth daily.    . Cholecalciferol ([employer]S VIT D 5000 HIGH-POTENCY) 5000 UNITS capsule Take 5,000 Units by mouth every other day.    . cyclobenzaprine (FLEXERIL) 10 MG tablet Take 10 mg by mouth as needed for muscle spasms.    Marland Kitchen docusate  sodium (COLACE) 100 MG capsule Take 100 mg by mouth as needed for constipation.    Marland Kitchen doxycycline (MONODOX) 100 MG capsule Take 100 mg by mouth 2 (two) times daily.     Marland Kitchen EXFORGE HCT 10-320-25 MG TABS Take by mouth daily.     Marland Kitchen ibuprofen (ADVIL,MOTRIN) 200 MG tablet Take 800 mg by mouth every 8 (eight) hours as needed for pain.    Marland Kitchen levocetirizine (XYZAL) 5 MG tablet Take 5 mg by mouth as needed for allergies.    Marland Kitchen LORazepam (ATIVAN) 0.5 MG tablet Take 0.5 mg by mouth every 8 (eight) hours as needed.     . lubiprostone (AMITIZA) 24 MCG capsule Take 1 capsule (24 mcg total) by mouth 2 (two) times daily with a meal. 60 capsule 11  . Multiple Vitamin (MULTIVITAMIN WITH MINERALS) TABS tablet Take 1 tablet by mouth daily.    . RABEprazole (ACIPHEX) 20 MG tablet Take 20 mg by mouth daily.     . hydrocortisone (PROCTOZONE-HC) 2.5 % rectal cream Place 1 application rectally 2 (two) times daily. (Patient not taking: Reported on 04/30/2014) 30 g 0   No current facility-administered medications for this visit.    Allergies as of 04/30/2014 - Review Complete 04/30/2014  Allergen Reaction Noted  . Latex  02/16/2013  . Sulfa antibiotics Rash 02/11/2013    Family History  Problem Relation Age of Onset  . Colon cancer Brother  71, deceased    History   Social History  . Marital Status: Divorced    Spouse Name: N/A    Number of Children: N/A  . Years of Education: N/A   Social History Main Topics  . Smoking status: Never Smoker   . Smokeless tobacco: None  . Alcohol Use: No  . Drug Use: No  . Sexual Activity: None   Other Topics Concern  . None   Social History Narrative    Review of Systems: As mentioned in HPI  Physical Exam: BP 120/73 mmHg  Pulse 73  Temp(Src) 97.6 F (36.4 C) (Oral)  Ht 5\' 4"  (1.626 m)  Wt 192 lb (87.091 kg)  BMI 32.94 kg/m2 General:   Alert and oriented. No distress noted. Pleasant and cooperative.  Head:  Normocephalic and atraumatic. Eyes:   Conjuctiva clear without scleral icterus. Mouth:  Oral mucosa pink and moist. Good dentition. No lesions. Heart:  S1, S2 present without murmurs, rubs, or gallops. Regular rate and rhythm. Abdomen:  +BS, soft, mild discomfort lower abdomen with palpation and non-distended. No rebound or guarding. No HSM or masses noted. Msk:  Symmetrical without gross deformities. Normal posture. Extremities:  Without edema. Neurologic:  Alert and  oriented x4;  grossly normal neurologically. Skin:  Intact without significant lesions or rashes. Psych:  Alert and cooperative. Normal mood and affect.'

## 2014-04-30 NOTE — Assessment & Plan Note (Signed)
Doing well with 24 mcg Amitiza once in morning and reduced dosage of 8 mcg in evening; will continue with this novel approach, as it does well for her. Previously, 8 mcg BID not enough and 24 mcg too strong. Colonoscopy up-to-date, with surveillance due 2019. Will contact local pharmacy to inquire best way of prescribing this (90 day supply?) Return in 6 months.

## 2014-04-30 NOTE — Patient Instructions (Signed)
Take Amitiza 24 mcg in the morning with food and 8 mcg in the evening with food. I have provided samples of the 8 mcg. I will contact your local pharmacy to see the best way to write this.   We will see you back in 6 months! Please let me know how this dosing works for you and which pharmacy you would like refills.

## 2014-05-04 ENCOUNTER — Telehealth: Payer: Self-pay | Admitting: Internal Medicine

## 2014-05-04 NOTE — Telephone Encounter (Signed)
Routing to AS 

## 2014-05-04 NOTE — Telephone Encounter (Signed)
Can you ask her if she needs [facility] or local pharmacy?

## 2014-05-04 NOTE — Telephone Encounter (Signed)
Pt seen AS on 1/15 and called today saying to leave her Amitiza at 25mcg and not change the dosage. I told her that I would let the nurse be aware.

## 2014-05-05 NOTE — Progress Notes (Signed)
cc'ed to pcp °

## 2014-05-05 NOTE — Telephone Encounter (Signed)
Tried to pt- LMOM

## 2014-05-20 NOTE — Telephone Encounter (Signed)
Pt never called back. She has enough refills until March.

## 2014-07-19 ENCOUNTER — Other Ambulatory Visit: Payer: Self-pay | Admitting: Gastroenterology

## 2014-07-20 ENCOUNTER — Telehealth: Payer: Self-pay | Admitting: Internal Medicine

## 2014-07-20 MED ORDER — RABEPRAZOLE SODIUM 20 MG PO TBEC: 20.0000 mg | DELAYED_RELEASE_TABLET | Freq: Every day | ORAL | Status: DC

## 2014-07-20 NOTE — Telephone Encounter (Signed)
Tried to call pt- NA-LMOM with information.  

## 2014-07-20 NOTE — Telephone Encounter (Signed)
Sent to [facility] 

## 2014-07-20 NOTE — Telephone Encounter (Signed)
Routing to the refill box. 

## 2014-07-20 NOTE — Telephone Encounter (Signed)
Patient called to say that her pharmacy said that they had faxed [place] twice for her refills of Amitiza and Aciphex,  She is out of Aciphex and was asking for a sample to hold her until she can get her refills. I told her that we do not have samples of Aciphex and I wasn't sure if the refill request have been sent or not, but I would put a phone note in that she needed refills on both. She was asking if she could get this done today since she was out. Please advise. [phone] or [phone]

## 2014-07-20 NOTE — Addendum Note (Signed)
Addended by: Mahala Menghini on: 07/20/2014 03:27 PM   Modules accepted: Orders

## 2014-10-19 ENCOUNTER — Encounter: Payer: Self-pay | Admitting: Gastroenterology

## 2014-10-29 ENCOUNTER — Ambulatory Visit: Payer: Medicare Other | Admitting: Gastroenterology

## 2014-11-10 ENCOUNTER — Ambulatory Visit (INDEPENDENT_AMBULATORY_CARE_PROVIDER_SITE_OTHER): Payer: Medicare Other | Admitting: Gastroenterology

## 2014-11-10 ENCOUNTER — Encounter: Payer: Self-pay | Admitting: Gastroenterology

## 2014-11-10 VITALS — BP 125/72 | HR 76 | Temp 98.2°F | Ht 63.0 in | Wt 186.8 lb

## 2014-11-10 DIAGNOSIS — K219 Gastro-esophageal reflux disease without esophagitis: Secondary | ICD-10-CM | POA: Diagnosis not present

## 2014-11-10 DIAGNOSIS — K5900 Constipation, unspecified: Secondary | ICD-10-CM | POA: Diagnosis not present

## 2014-11-10 MED ORDER — LUBIPROSTONE 24 MCG PO CAPS: ORAL_CAPSULE | ORAL | Status: DC

## 2014-11-10 MED ORDER — RABEPRAZOLE SODIUM 20 MG PO TBEC: 20.0000 mg | DELAYED_RELEASE_TABLET | Freq: Every day | ORAL | Status: DC

## 2014-11-10 NOTE — Assessment & Plan Note (Signed)
Controlled with Aciphex. Occasional H2 blocker needed but rare. Continue dietary/behavior modification. Continue weight loss measures. 90 day supply sent to pharmacy with refills. Return in 1 year.

## 2014-11-10 NOTE — Assessment & Plan Note (Signed)
Doing well on Amitiza 24 mcg once to twice daily. Next colonoscopy in 2019. Return in 1 year. Refills provided.

## 2014-11-10 NOTE — Patient Instructions (Signed)
Continue Aciphex daily. I have sent refills to [facility].   I have also refilled Amitiza to take twice a day.   We will see you back in 1 year.   Next colonoscopy is due in 2019.

## 2014-11-10 NOTE — Progress Notes (Signed)
Referring Provider: Delphina Cahill, MD Primary Care Physician:  Delphina Cahill, MD  Primary GI: Dr. Gala Romney   Chief Complaint  Patient presents with  . Constipation    HPI:   Kendra Orozco is a 69 y.o. female presenting today with a history of constipation and GERD. Colonoscopy due for surveillance purposes in 2019. On Amitiza 24 mcg, which works well for her. She adjusts her dosage as needed, taking 1-2 times per day.   States she had been doing well until she developed an ear infection, placed on abx, then developed a yeast infection. Getting better with OTC agents. Had more frequent stools with antibiotics.   Aciphex daily for GERD. If misses dose, she is aware. Worsened by food choices. Trying to limit the "fun foods". Will rarely take an antacid at night but not all the time.    Past Medical History  Diagnosis Date  . GERD (gastroesophageal reflux disease)   . Hypertension     Past Surgical History  Procedure Laterality Date  . Esophagogastroduodenoscopy  09/16/2007    [id number] undulating Z-line, tiny distal esophageal  erosions consistent with mild erosive reflux esophagitis, patulous EG junction, small hiatal hernia, otherwise normal stomach, D1, and D2.  . Colonoscopy  09/16/2007    [id number] canal hemorrhoids, otherwise normal rectum left-sided diverticula and colonic mucosa appeared normal.  . Hemorrhoid surgery      X 2   . Back surgery    . Abdominal hysterectomy      fibroid tumors  . Colonoscopy N/A 03/02/2013    Dr. Chauncy Lean adenoma. Colonic diverticulosis. Anal canal hemorrhoids-likely source of hematochezia. Surveillance 2019    Current Outpatient Prescriptions  Medication Sig Dispense Refill  . AMITIZA 24 MCG capsule TAKE ONE CAPSULE BY MOUTH TWICE DAILY WITH A MEAL. 60 capsule 11  . amoxicillin-clavulanate (AUGMENTIN) 875-125 MG per tablet Take 1 tablet by mouth 2 (two) times daily.   0  . calcium carbonate (OS-CAL) 1250 MG chewable tablet Chew 1  tablet by mouth daily.    . Cholecalciferol (CVS VIT D 5000 HIGH-POTENCY) 5000 UNITS capsule Take 5,000 Units by mouth every other day.    . cyclobenzaprine (FLEXERIL) 10 MG tablet Take 10 mg by mouth as needed for muscle spasms.    Marland Kitchen docusate sodium (COLACE) 100 MG capsule Take 100 mg by mouth as needed for constipation.    Marland Kitchen doxycycline (MONODOX) 100 MG capsule Take 100 mg by mouth 2 (two) times daily.     Marland Kitchen EXFORGE HCT 10-320-25 MG TABS Take by mouth daily.     Marland Kitchen ibuprofen (ADVIL,MOTRIN) 200 MG tablet Take 800 mg by mouth every 8 (eight) hours as needed for pain.    Marland Kitchen levocetirizine (XYZAL) 5 MG tablet Take 5 mg by mouth as needed for allergies.    Marland Kitchen LORazepam (ATIVAN) 0.5 MG tablet Take 0.5 mg by mouth every 8 (eight) hours as needed.     . montelukast (SINGULAIR) 10 MG tablet   2  . Multiple Vitamin (MULTIVITAMIN WITH MINERALS) TABS tablet Take 1 tablet by mouth daily.    . RABEprazole (ACIPHEX) 20 MG tablet Take 1 tablet (20 mg total) by mouth daily. 30 tablet 11   No current facility-administered medications for this visit.    Allergies as of 11/10/2014 - Review Complete 11/10/2014  Allergen Reaction Noted  . Latex  02/16/2013  . Sulfa antibiotics Rash 02/11/2013    Family History  Problem Relation Age of Onset  . Colon cancer Brother  72, deceased    History   Social History  . Marital Status: Divorced    Spouse Name: N/A  . Number of Children: N/A  . Years of Education: N/A   Social History Main Topics  . Smoking status: Never Smoker   . Smokeless tobacco: Not on file  . Alcohol Use: No  . Drug Use: No  . Sexual Activity: Not on file   Other Topics Concern  . None   Social History Narrative    Review of Systems: Negative unless mentioned in HPI.   Physical Exam: BP 125/72 mmHg  Pulse 76  Temp(Src) 98.2 F (36.8 C) (Oral)  Ht 5\' 3"  (1.6 m)  Wt 186 lb 12.8 oz (84.732 kg)  BMI 33.10 kg/m2 General:   Alert and oriented. No distress noted. Pleasant  and cooperative.  Head:  Normocephalic and atraumatic. Eyes:  Conjuctiva clear without scleral icterus. Mouth:  Oral mucosa pink and moist. Good dentition. No lesions. Abdomen:  +BS, soft, non-tender and non-distended. No rebound or guarding. No HSM or masses noted. Msk:  Symmetrical without gross deformities. Normal posture. Extremities:  Without edema. Neurologic:  Alert and  oriented x4;  grossly normal neurologically. Psych:  Alert and cooperative. Normal mood and affect.

## 2014-11-15 NOTE — Progress Notes (Signed)
CC'ED TO PCP 

## 2015-07-08 ENCOUNTER — Telehealth: Payer: Self-pay | Admitting: Internal Medicine

## 2015-07-08 DIAGNOSIS — K219 Gastro-esophageal reflux disease without esophagitis: Secondary | ICD-10-CM

## 2015-07-08 NOTE — Telephone Encounter (Signed)
PATIENT STATED THAT SHE GOT NOTICE FROM [employer] THAT HER ACIPHEX WAS NOT GOING TO BE COVERED, THEY SAID THEY WOULD NOT OK IT.  IT WORKS FOR HER AND SHE NEEDS TO KNOW IF SOMETHING CAN BE DONE OR IS THERE A SUBSTITUTE SHE CAN TRY.  [phone]  PLEASE ADVISE, SHE HAS A FEW WEEKS LEFT OF THE BOTTLE SHE IS CURRENTLY TAKING.

## 2015-07-08 NOTE — Telephone Encounter (Signed)
PA has been sent to the insurance co. 

## 2015-07-13 NOTE — Telephone Encounter (Signed)
Received fax from her insurance co- pt has to try and fail omeprazole and pantoprazole. Pt has only tried lansoprazole. She has been on aciphex since 2012.  Pt will have to try and fail both omeprazole and pantoprazole unless you want to do an appeal letter to send to the insurance.

## 2015-07-14 MED ORDER — OMEPRAZOLE 20 MG PO CPDR: 20.0000 mg | DELAYED_RELEASE_CAPSULE | Freq: Every day | ORAL | Status: DC

## 2015-07-14 NOTE — Telephone Encounter (Signed)
Tried to call pt- NA-Left detailed message on voicemail.

## 2015-07-14 NOTE — Telephone Encounter (Signed)
Lets do a 30 trial of each. We'll start with omeprazole 20 mg daily. Rx sent to pharmacy. Please notify patient. Call in 3-4 weeks with progress report.

## 2015-07-14 NOTE — Addendum Note (Signed)
Addended by: Gordy Levan, ERIC A on: 07/14/2015 12:33 PM   Modules accepted: Orders

## 2015-08-29 ENCOUNTER — Other Ambulatory Visit: Payer: Self-pay | Admitting: Internal Medicine

## 2015-08-29 DIAGNOSIS — K219 Gastro-esophageal reflux disease without esophagitis: Secondary | ICD-10-CM

## 2015-08-29 NOTE — Telephone Encounter (Signed)
See 07/08/15 phone note. Pt needed to try and fail omeprazole and pantoprazole. Please send in pantoprazole.

## 2015-08-29 NOTE — Telephone Encounter (Signed)
Pt called to say that she has been trying omeprazole and it causes her to swell (face, legs). She has stopped taking it and wanted to know if the other prescription had been called in for her to try to see if it'll work for her. Please call her cell [phone]

## 2015-08-30 MED ORDER — PANTOPRAZOLE SODIUM 40 MG PO TBEC: 40.0000 mg | DELAYED_RELEASE_TABLET | Freq: Every day | ORAL | Status: DC

## 2015-08-30 NOTE — Addendum Note (Signed)
Addended by: Gordy Levan,  A on: 08/30/2015 02:44 PM   Modules accepted: Orders, Medications

## 2015-08-30 NOTE — Telephone Encounter (Signed)
Please notify patient Protonix 30 days supply (with 1 refil) sent to pharmacy. Please call with progress report in 3-4 weeks.

## 2015-09-21 ENCOUNTER — Encounter: Payer: Self-pay | Admitting: Internal Medicine

## 2015-09-30 ENCOUNTER — Encounter: Payer: Self-pay | Admitting: Internal Medicine

## 2015-09-30 ENCOUNTER — Ambulatory Visit (INDEPENDENT_AMBULATORY_CARE_PROVIDER_SITE_OTHER): Payer: Medicare Other | Admitting: Internal Medicine

## 2015-09-30 VITALS — BP 124/79 | HR 91 | Temp 98.5°F | Ht 64.0 in | Wt 194.0 lb

## 2015-09-30 DIAGNOSIS — K5909 Other constipation: Secondary | ICD-10-CM | POA: Diagnosis not present

## 2015-09-30 DIAGNOSIS — K219 Gastro-esophageal reflux disease without esophagitis: Secondary | ICD-10-CM

## 2015-09-30 MED ORDER — LUBIPROSTONE 24 MCG PO CAPS: ORAL_CAPSULE | ORAL | Status: DC

## 2015-09-30 MED ORDER — PANTOPRAZOLE SODIUM 40 MG PO TBEC: 40.0000 mg | DELAYED_RELEASE_TABLET | Freq: Every day | ORAL | Status: DC

## 2015-09-30 NOTE — Progress Notes (Signed)
Primary Care Physician:  Wende Neighbors, MD Primary Gastroenterologist:  Dr. Gala Romney  Pre-Procedure History & Physical: HPI:  Kendra Orozco is a 70 y.o. female here for GERD and constipation. Insurance dictated she come off of AcipHex; tried omeprazole-developed total body edema. Stop this medication. Now on pantoprazole 40 mg daily. Edema better reflux controlled. Worsening constipation combating effectively with Amitiza 24 mcg twice a day. No rectal bleeding. No dysphagia. She's please with her current regimen.  Past Medical History  Diagnosis Date  . GERD (gastroesophageal reflux disease)   . Hypertension     Past Surgical History  Procedure Laterality Date  . Esophagogastroduodenoscopy  09/16/2007    [id number] undulating Z-line, tiny distal esophageal  erosions consistent with mild erosive reflux esophagitis, patulous EG junction, small hiatal hernia, otherwise normal stomach, D1, and D2.  . Colonoscopy  09/16/2007    [id number] canal hemorrhoids, otherwise normal rectum left-sided diverticula and colonic mucosa appeared normal.  . Hemorrhoid surgery      X 2   . Back surgery    . Abdominal hysterectomy      fibroid tumors  . Colonoscopy N/A 03/02/2013    Dr. Chauncy Lean adenoma. Colonic diverticulosis. Anal canal hemorrhoids-likely source of hematochezia. Surveillance 2019    Prior to Admission medications   Medication Sig Start Date End Date Taking? Authorizing Provider  amoxicillin-clavulanate (AUGMENTIN) 875-125 MG per tablet Take 1 tablet by mouth 2 (two) times daily.  10/29/14  Yes Historical Provider, MD  calcium carbonate (OS-CAL) 1250 MG chewable tablet Chew 1 tablet by mouth daily.   Yes Historical Provider, MD  Cholecalciferol (CVS VIT D 5000 HIGH-POTENCY) 5000 UNITS capsule Take 5,000 Units by mouth every other day.   Yes Historical Provider, MD  cyclobenzaprine (FLEXERIL) 10 MG tablet Take 10 mg by mouth as needed for muscle spasms.   Yes Historical Provider, MD    docusate sodium (COLACE) 100 MG capsule Take 100 mg by mouth as needed for constipation.   Yes Historical Provider, MD  doxycycline (MONODOX) 100 MG capsule Take 100 mg by mouth 2 (two) times daily.  12/28/12  Yes Historical Provider, MD  EXFORGE HCT 10-320-25 MG TABS Take by mouth daily.  01/05/13  Yes Historical Provider, MD  ibuprofen (ADVIL,MOTRIN) 200 MG tablet Take 800 mg by mouth every 8 (eight) hours as needed for pain.   Yes Historical Provider, MD  levocetirizine (XYZAL) 5 MG tablet Take 5 mg by mouth as needed for allergies.   Yes Historical Provider, MD  LORazepam (ATIVAN) 0.5 MG tablet Take 0.5 mg by mouth every 8 (eight) hours as needed.    Yes Historical Provider, MD  lubiprostone (AMITIZA) 24 MCG capsule TAKE ONE CAPSULE BY MOUTH TWICE DAILY WITH A MEAL. 09/30/15  Yes Daneil Dolin, MD  montelukast (SINGULAIR) 10 MG tablet  10/29/14  Yes Historical Provider, MD  Multiple Vitamin (MULTIVITAMIN WITH MINERALS) TABS tablet Take 1 tablet by mouth daily.   Yes Historical Provider, MD  pantoprazole (PROTONIX) 40 MG tablet Take 1 tablet (40 mg total) by mouth daily. 09/30/15  Yes Daneil Dolin, MD  RABEprazole (ACIPHEX) 20 MG tablet Take 1 tablet (20 mg total) by mouth daily. 11/10/14  Yes Orvil Feil, NP    Allergies as of 09/30/2015 - Review Complete 09/30/2015  Allergen Reaction Noted  . Latex  02/16/2013  . Sulfa antibiotics Rash 02/11/2013    Family History  Problem Relation Age of Onset  . Colon cancer Brother  52, deceased    Social History   Social History  . Marital Status: Divorced    Spouse Name: N/A  . Number of Children: N/A  . Years of Education: N/A   Occupational History  . Not on file.   Social History Main Topics  . Smoking status: Never Smoker   . Smokeless tobacco: Not on file  . Alcohol Use: No  . Drug Use: No  . Sexual Activity: Not on file   Other Topics Concern  . Not on file   Social History Narrative    Review of Systems: See HPI,  otherwise negative ROS  Physical Exam: BP 124/79 mmHg  Pulse 91  Temp(Src) 98.5 F (36.9 C) (Oral)  Ht 5\' 4"  (1.626 m)  Wt 194 lb (87.998 kg)  BMI 33.28 kg/m2 General:   Alert,  Well-developed, well-nourished, pleasant and cooperative in NAD Skin:  Intact without significant lesions or rashes. Neck:  Supple; no masses or thyromegaly. No significant cervical adenopathy. Lungs:  Clear throughout to auscultation.   No wheezes, crackles, or rhonchi. No acute distress. Heart:  Regular rate and rhythm; no murmurs, clicks, rubs,  or gallops. Abdomen: Non-distended, normal bowel sounds.  Soft and nontender without appreciable mass or hepatosplenomegaly.  Pulses:  Normal pulses noted. Extremities:  Without clubbing or edema.  Impression:  Pleasant 70 year old lady with long-standing GERD now fairly well-controlled Pop resolve 40 mg daily. Some worsening constipation now managed with Amitiza 24 twice a day. No side effects with this regimen. Review the multipronged broach regarding treatment of reflux. No alarm symptoms at this time  Recommendations  Continue pantoprazole 40 mg daily  Continue Amitiza 24 twice daily  GERD information  Office visit in 3 months      Notice: This dictation was prepared with Dragon dictation along with smaller phrase technology. Any transcriptional errors that result from this process are unintentional and may not be corrected upon review.

## 2015-09-30 NOTE — Patient Instructions (Addendum)
Continue pantoprazole 40 mg daily  Continue Amitiza 24 twice daily  GERD information  Office visit in 3 months

## 2015-11-17 ENCOUNTER — Encounter: Payer: Self-pay | Admitting: Internal Medicine

## 2016-03-13 ENCOUNTER — Encounter: Payer: Self-pay | Admitting: Internal Medicine

## 2016-03-13 ENCOUNTER — Ambulatory Visit (INDEPENDENT_AMBULATORY_CARE_PROVIDER_SITE_OTHER): Payer: Medicare Other | Admitting: Internal Medicine

## 2016-03-13 VITALS — BP 119/71 | HR 91 | Temp 97.6°F | Ht 62.0 in | Wt 189.4 lb

## 2016-03-13 DIAGNOSIS — K641 Second degree hemorrhoids: Secondary | ICD-10-CM | POA: Diagnosis not present

## 2016-03-13 DIAGNOSIS — K219 Gastro-esophageal reflux disease without esophagitis: Secondary | ICD-10-CM

## 2016-03-13 DIAGNOSIS — K5904 Chronic idiopathic constipation: Secondary | ICD-10-CM | POA: Diagnosis not present

## 2016-03-13 NOTE — Patient Instructions (Signed)
GERD information  Foam wedge to elevate head of bed  You have not done well with omeprazole or pantoprazole - we will contact [employer] about going back on rabeprazole  Take Amitiza twice daily   May use Miralax as needed  Begin Benefiber 1 tablespoon twice daily  Pamphlet on hemorrhoid banding  Office visit in 6 weeks

## 2016-03-13 NOTE — Progress Notes (Signed)
Primary Care Physician:  Wende Neighbors, MD Primary Gastroenterologist:  Dr. Gala Romney  Pre-Procedure History & Physical: HPI:  Kendra Orozco is a 70 y.o. female here for follow-up of GERD. Has breakthrough symptoms nocturnally on pantoprazole previously on omeprazole; nothing worked as good as rabeprazole. No dysphagia. Does not sleep with an elevated head of bed. States when she took rabeprazole her bowels move better than they move now. Has intermittent constipation has to strain. Has a hard time evacuating everything out. No rectal bleeding. Does not take a fiber supple. Takes MiraLAX 2-3 times yearly. Takes Amitiza twice daily-no side effects apparent.  Positive family history of colon cancer;  personal history colonic adenoma; due for colonoscopy 2019 - surveillance.  Past Medical History:  Diagnosis Date  . GERD (gastroesophageal reflux disease)   . Hypertension     Past Surgical History:  Procedure Laterality Date  . ABDOMINAL HYSTERECTOMY     fibroid tumors  . BACK SURGERY    . COLONOSCOPY  09/16/2007   [id number] canal hemorrhoids, otherwise normal rectum left-sided diverticula and colonic mucosa appeared normal.  . COLONOSCOPY N/A 03/02/2013   Dr. Chauncy Lean adenoma. Colonic diverticulosis. Anal canal hemorrhoids-likely source of hematochezia. Surveillance 2019  . ESOPHAGOGASTRODUODENOSCOPY  09/16/2007   [id number] undulating Z-line, tiny distal esophageal  erosions consistent with mild erosive reflux esophagitis, patulous EG junction, small hiatal hernia, otherwise normal stomach, D1, and D2.  . HEMORRHOID SURGERY     X 2     Prior to Admission medications   Medication Sig Start Date End Date Taking? Authorizing Provider  calcium carbonate (OS-CAL) 1250 MG chewable tablet Chew 1 tablet by mouth daily.   Yes Historical Provider, MD  Cholecalciferol (CVS VIT D 5000 HIGH-POTENCY) 5000 UNITS capsule Take 5,000 Units by mouth every other day.   Yes Historical Provider, MD    cyclobenzaprine (FLEXERIL) 10 MG tablet Take 10 mg by mouth as needed for muscle spasms.   Yes Historical Provider, MD  docusate sodium (COLACE) 100 MG capsule Take 100 mg by mouth as needed for constipation.   Yes Historical Provider, MD  doxycycline (MONODOX) 100 MG capsule Take 100 mg by mouth 2 (two) times daily.  12/28/12  Yes Historical Provider, MD  EXFORGE HCT 10-320-25 MG TABS Take by mouth daily.  01/05/13  Yes Historical Provider, MD  ibuprofen (ADVIL,MOTRIN) 200 MG tablet Take 800 mg by mouth every 8 (eight) hours as needed for pain.   Yes Historical Provider, MD  levocetirizine (XYZAL) 5 MG tablet Take 5 mg by mouth as needed for allergies.   Yes Historical Provider, MD  LORazepam (ATIVAN) 0.5 MG tablet Take 0.5 mg by mouth every 8 (eight) hours as needed.    Yes Historical Provider, MD  lubiprostone (AMITIZA) 24 MCG capsule TAKE ONE CAPSULE BY MOUTH TWICE DAILY WITH A MEAL. 09/30/15  Yes Daneil Dolin, MD  montelukast (SINGULAIR) 10 MG tablet  10/29/14  Yes Historical Provider, MD  Multiple Vitamin (MULTIVITAMIN WITH MINERALS) TABS tablet Take 1 tablet by mouth daily.   Yes Historical Provider, MD  pantoprazole (PROTONIX) 40 MG tablet Take 1 tablet (40 mg total) by mouth daily. 09/30/15  Yes Daneil Dolin, MD  amoxicillin-clavulanate (AUGMENTIN) 875-125 MG per tablet Take 1 tablet by mouth 2 (two) times daily.  10/29/14   Historical Provider, MD  RABEprazole (ACIPHEX) 20 MG tablet Take 1 tablet (20 mg total) by mouth daily. Patient not taking: Reported on 03/13/2016 11/10/14   Annitta Needs, NP  Allergies as of 03/13/2016 - Review Complete 03/13/2016  Allergen Reaction Noted  . Latex  02/16/2013  . Sulfa antibiotics Rash 02/11/2013    Family History  Problem Relation Age of Onset  . Colon cancer Brother     33, deceased    Social History   Social History  . Marital status: Divorced    Spouse name: N/A  . Number of children: N/A  . Years of education: N/A   Occupational  History  . Not on file.   Social History Main Topics  . Smoking status: Never Smoker  . Smokeless tobacco: Not on file  . Alcohol use No  . Drug use: No  . Sexual activity: Not on file   Other Topics Concern  . Not on file   Social History Narrative  . No narrative on file    Review of Systems: See HPI, otherwise negative ROS  Physical Exam: BP 119/71   Pulse 91   Temp 97.6 F (36.4 C) (Oral)   Ht 5\' 2"  (1.575 m)   Wt 189 lb 6.4 oz (85.9 kg)   BMI 34.64 kg/m  General:  pleasant and cooperative in NAD Skin:  Intact without significant lesions or rashes. Eyes:  Sclera clear, no icterus.   Conjunctiva pink. Neck:  Supple; no masses or thyromegaly. No significant cervical adenopathy. Abdomen: Non-distended, normal bowel sounds.  Soft and nontender without appreciable mass or hepatosplenomegaly.   Impression:  Pleasant 70 year old lady with breakthrough reflux symptoms on Protonix. Feels Protonix is inferior to rabeprazole. Also tried omeprazole previously.  Feels her bowels move better when she was on rabeprazole as well. For constipation these days without any alarm symptoms. Patient describes grade 2-3 hemorrhoids. Not on a fiber supplement.  History of colonic adenoma and positive family history of colon cancer.  Flare-up of hemorrhoids in the setting of constipation., Not mentioned above, recti-care has helped recently.   Recommendations:  GERD information provided  Foam wedge to elevate head of bed  Patient has not done well with omeprazole or pantoprazole - we will contact [employer] about going back on rabeprazole  Take Amitiza twice daily   May use Miralax as needed  Begin Benefiber 1 tablespoon twice daily  Pamphlet on hemorrhoid banding  Office visit in 6 weeks  Surveillance colonoscopy 2019.       Notice: This dictation was prepared with Dragon dictation along with smaller phrase technology. Any transcriptional errors that result from this process  are unintentional and may not be corrected upon review.

## 2016-03-14 ENCOUNTER — Telehealth: Payer: Self-pay | Admitting: Internal Medicine

## 2016-03-14 ENCOUNTER — Encounter: Payer: Self-pay | Admitting: Internal Medicine

## 2016-03-14 NOTE — Telephone Encounter (Signed)
Pt came by the front window at 1215 to say that she was here yesterday and was letting us know that she is getting rid of [employer] after the first of the year and getting [employer]. She was saying that we were working with getting her assistance on some medications and she said that she would have enough to get her through the rest of the year and wanted the nurse to be aware.

## 2016-03-16 NOTE — Telephone Encounter (Signed)
Tried to call pt- NA-LMOM informing her that her medication (rabeprazole) was approved. I need to know if she needs it sent in to her pharmacy.

## 2016-03-19 ENCOUNTER — Other Ambulatory Visit: Payer: Self-pay | Admitting: Gastroenterology

## 2016-04-06 ENCOUNTER — Telehealth: Payer: Self-pay | Admitting: Internal Medicine

## 2016-04-06 NOTE — Telephone Encounter (Signed)
Pt called to say that she has lost her prescription of Amitiza and needs either samples or a prescription called into her pharmacy. Please advise and call [phone]

## 2016-04-11 NOTE — Telephone Encounter (Signed)
Pt said she found her samples and is good until she comes in for her appt on 04/17/2016.

## 2016-04-17 ENCOUNTER — Ambulatory Visit (INDEPENDENT_AMBULATORY_CARE_PROVIDER_SITE_OTHER): Payer: PPO | Admitting: Internal Medicine

## 2016-04-17 ENCOUNTER — Encounter: Payer: Self-pay | Admitting: Internal Medicine

## 2016-04-17 VITALS — BP 125/81 | HR 71 | Temp 97.7°F | Ht 63.0 in | Wt 190.0 lb

## 2016-04-17 DIAGNOSIS — K5900 Constipation, unspecified: Secondary | ICD-10-CM

## 2016-04-17 DIAGNOSIS — Z86010 Personal history of colonic polyps: Secondary | ICD-10-CM | POA: Diagnosis not present

## 2016-04-17 DIAGNOSIS — K219 Gastro-esophageal reflux disease without esophagitis: Secondary | ICD-10-CM | POA: Diagnosis not present

## 2016-04-17 NOTE — Progress Notes (Signed)
Primary Care Physician:  Wende Neighbors, MD Primary Gastroenterologist:  Dr. Gala Romney  Pre-Procedure History & Physical: HPI:  Kendra Orozco is a 71 y.o. female here for follow-up of symptomatic hemorrhoids, constipation / GERD. Now that she's back on rabeprazole, her reflux symptoms have settled down. No dysphagia. Bowel function  improved dramatically on Benefiber and Amitiza. No hemorrhoid issues at this time. Due for surveillance colonoscopy 2019. She specifically denies rectal bleeding.  Past Medical History:  Diagnosis Date  . GERD (gastroesophageal reflux disease)   . Hypertension     Past Surgical History:  Procedure Laterality Date  . ABDOMINAL HYSTERECTOMY     fibroid tumors  . BACK SURGERY    . COLONOSCOPY  09/16/2007   [id number] canal hemorrhoids, otherwise normal rectum left-sided diverticula and colonic mucosa appeared normal.  . COLONOSCOPY N/A 03/02/2013   Dr. Chauncy Lean adenoma. Colonic diverticulosis. Anal canal hemorrhoids-likely source of hematochezia. Surveillance 2019  . ESOPHAGOGASTRODUODENOSCOPY  09/16/2007   [id number] undulating Z-line, tiny distal esophageal  erosions consistent with mild erosive reflux esophagitis, patulous EG junction, small hiatal hernia, otherwise normal stomach, D1, and D2.  . HEMORRHOID SURGERY     X 2     Prior to Admission medications   Medication Sig Start Date End Date Taking? Authorizing Provider  calcium carbonate (OS-CAL) 1250 MG chewable tablet Chew 1 tablet by mouth daily.   Yes Historical Provider, MD  Cholecalciferol ([facility] VIT D 5000 HIGH-POTENCY) 5000 UNITS capsule Take 5,000 Units by mouth every other day.   Yes Historical Provider, MD  docusate sodium (COLACE) 100 MG capsule Take 100 mg by mouth as needed for constipation.   Yes Historical Provider, MD  EXFORGE HCT 10-320-25 MG TABS Take by mouth daily.  01/05/13  Yes Historical Provider, MD  ibuprofen (ADVIL,MOTRIN) 200 MG tablet Take 800 mg by mouth every 8  (eight) hours as needed for pain.   Yes Historical Provider, MD  levocetirizine (XYZAL) 5 MG tablet Take 5 mg by mouth as needed for allergies.   Yes Historical Provider, MD  LORazepam (ATIVAN) 0.5 MG tablet Take 0.5 mg by mouth every 8 (eight) hours as needed.    Yes Historical Provider, MD  lubiprostone (AMITIZA) 24 MCG capsule TAKE ONE CAPSULE BY MOUTH TWICE DAILY WITH A MEAL. 09/30/15  Yes Daneil Dolin, MD  montelukast (SINGULAIR) 10 MG tablet Take 10 mg by mouth 2 (two) times daily.  10/29/14  Yes Historical Provider, MD  Multiple Vitamin (MULTIVITAMIN WITH MINERALS) TABS tablet Take 1 tablet by mouth daily.   Yes Historical Provider, MD  pantoprazole (PROTONIX) 40 MG tablet Take 1 tablet (40 mg total) by mouth daily. 09/30/15  Yes Daneil Dolin, MD  RABEprazole (ACIPHEX) 20 MG tablet TAKE ONE TABLET BY MOUTH DAILY. 03/20/16  Yes Mahala Menghini, PA-C  Wheat Dextrin (BENEFIBER) POWD Take by mouth 2 (two) times daily. 1 packet=1 tbsp bid   Yes Historical Provider, MD  amoxicillin-clavulanate (AUGMENTIN) 875-125 MG per tablet Take 1 tablet by mouth 2 (two) times daily.  10/29/14   Historical Provider, MD  cyclobenzaprine (FLEXERIL) 10 MG tablet Take 10 mg by mouth as needed for muscle spasms.    Historical Provider, MD  doxycycline (MONODOX) 100 MG capsule Take 100 mg by mouth 2 (two) times daily.  12/28/12   Historical Provider, MD    Allergies as of 04/17/2016 - Review Complete 04/17/2016  Allergen Reaction Noted  . Latex  02/16/2013  . Sulfa antibiotics Rash 02/11/2013  Family History  Problem Relation Age of Onset  . Colon cancer Brother     18, deceased    Social History   Social History  . Marital status: Divorced    Spouse name: N/A  . Number of children: N/A  . Years of education: N/A   Occupational History  . Not on file.   Social History Main Topics  . Smoking status: Never Smoker  . Smokeless tobacco: Not on file  . Alcohol use No  . Drug use: No  . Sexual  activity: Not on file   Other Topics Concern  . Not on file   Social History Narrative  . No narrative on file    Review of Systems: See HPI, otherwise negative ROS  Physical Exam: BP 125/81   Pulse 71   Temp 97.7 F (36.5 C) (Oral)   Ht 5\' 3"  (1.6 m)   Wt 190 lb (86.2 kg)   BMI 33.66 kg/m  General:   Alert,  Well-developed, well-nourished, pleasant and cooperative in NAD  Impression:  71 year old lady with chronic constipation now doing very well on Benefiber and Amitiza. No hemorrhoid symptoms at this time. GERD also well controlled on rabeprazole. She is very happy with her progress. I see no need for further intervention at this time  Recommendations:  GERD and constipation information provided  Continue Rabeprazole, Amitiza  And Benefiber daily  Office visit in 1 year and as needed   Surveillance colonoscopy 2019.    Notice: This dictation was prepared with Dragon dictation along with smaller phrase technology. Any transcriptional errors that result from this process are unintentional and may not be corrected upon review.

## 2016-04-17 NOTE — Patient Instructions (Signed)
GERD and constipation information provided  Continue Rabeprazole, Amitiza  And Benefiber daily  Office visit in 1 year and as needed

## 2016-04-20 ENCOUNTER — Ambulatory Visit: Payer: Medicare Other | Admitting: Internal Medicine

## 2016-04-20 DIAGNOSIS — M545 Low back pain: Secondary | ICD-10-CM | POA: Diagnosis not present

## 2016-05-22 ENCOUNTER — Other Ambulatory Visit (HOSPITAL_COMMUNITY): Payer: Self-pay | Admitting: Internal Medicine

## 2016-05-22 DIAGNOSIS — Z1231 Encounter for screening mammogram for malignant neoplasm of breast: Secondary | ICD-10-CM

## 2016-06-01 DIAGNOSIS — E782 Mixed hyperlipidemia: Secondary | ICD-10-CM | POA: Diagnosis not present

## 2016-06-01 DIAGNOSIS — I10 Essential (primary) hypertension: Secondary | ICD-10-CM | POA: Diagnosis not present

## 2016-06-04 ENCOUNTER — Encounter (HOSPITAL_COMMUNITY): Payer: Self-pay

## 2016-06-04 ENCOUNTER — Ambulatory Visit (HOSPITAL_COMMUNITY)
Admission: RE | Admit: 2016-06-04 | Discharge: 2016-06-04 | Disposition: A | Discharge status: Home / Self Care | Payer: PPO | Source: Ambulatory Visit | Attending: Internal Medicine | Admitting: Internal Medicine

## 2016-06-04 DIAGNOSIS — Z1231 Encounter for screening mammogram for malignant neoplasm of breast: Secondary | ICD-10-CM | POA: Diagnosis not present

## 2016-06-05 DIAGNOSIS — F411 Generalized anxiety disorder: Secondary | ICD-10-CM | POA: Diagnosis not present

## 2016-06-05 DIAGNOSIS — L989 Disorder of the skin and subcutaneous tissue, unspecified: Secondary | ICD-10-CM | POA: Diagnosis not present

## 2016-06-05 DIAGNOSIS — K581 Irritable bowel syndrome with constipation: Secondary | ICD-10-CM | POA: Diagnosis not present

## 2016-06-05 DIAGNOSIS — I10 Essential (primary) hypertension: Secondary | ICD-10-CM | POA: Diagnosis not present

## 2016-06-05 DIAGNOSIS — E782 Mixed hyperlipidemia: Secondary | ICD-10-CM | POA: Diagnosis not present

## 2016-06-05 DIAGNOSIS — M25511 Pain in right shoulder: Secondary | ICD-10-CM | POA: Diagnosis not present

## 2016-06-05 DIAGNOSIS — K219 Gastro-esophageal reflux disease without esophagitis: Secondary | ICD-10-CM | POA: Diagnosis not present

## 2016-06-05 DIAGNOSIS — Z0000 Encounter for general adult medical examination without abnormal findings: Secondary | ICD-10-CM | POA: Diagnosis not present

## 2016-06-06 ENCOUNTER — Other Ambulatory Visit: Payer: Self-pay | Admitting: Internal Medicine

## 2016-06-11 DIAGNOSIS — I10 Essential (primary) hypertension: Secondary | ICD-10-CM | POA: Diagnosis not present

## 2016-06-11 DIAGNOSIS — F411 Generalized anxiety disorder: Secondary | ICD-10-CM | POA: Diagnosis not present

## 2016-06-11 DIAGNOSIS — E782 Mixed hyperlipidemia: Secondary | ICD-10-CM | POA: Diagnosis not present

## 2016-06-11 DIAGNOSIS — Z0000 Encounter for general adult medical examination without abnormal findings: Secondary | ICD-10-CM | POA: Diagnosis not present

## 2016-06-11 DIAGNOSIS — K581 Irritable bowel syndrome with constipation: Secondary | ICD-10-CM | POA: Diagnosis not present

## 2016-06-11 DIAGNOSIS — K219 Gastro-esophageal reflux disease without esophagitis: Secondary | ICD-10-CM | POA: Diagnosis not present

## 2016-06-11 DIAGNOSIS — L989 Disorder of the skin and subcutaneous tissue, unspecified: Secondary | ICD-10-CM | POA: Diagnosis not present

## 2016-06-11 DIAGNOSIS — M25511 Pain in right shoulder: Secondary | ICD-10-CM | POA: Diagnosis not present

## 2016-06-22 DIAGNOSIS — M9901 Segmental and somatic dysfunction of cervical region: Secondary | ICD-10-CM | POA: Diagnosis not present

## 2016-06-22 DIAGNOSIS — M9902 Segmental and somatic dysfunction of thoracic region: Secondary | ICD-10-CM | POA: Diagnosis not present

## 2016-06-22 DIAGNOSIS — M5413 Radiculopathy, cervicothoracic region: Secondary | ICD-10-CM | POA: Diagnosis not present

## 2016-06-22 DIAGNOSIS — M546 Pain in thoracic spine: Secondary | ICD-10-CM | POA: Diagnosis not present

## 2016-06-26 DIAGNOSIS — M546 Pain in thoracic spine: Secondary | ICD-10-CM | POA: Diagnosis not present

## 2016-06-26 DIAGNOSIS — M9902 Segmental and somatic dysfunction of thoracic region: Secondary | ICD-10-CM | POA: Diagnosis not present

## 2016-06-26 DIAGNOSIS — M9901 Segmental and somatic dysfunction of cervical region: Secondary | ICD-10-CM | POA: Diagnosis not present

## 2016-06-26 DIAGNOSIS — M5413 Radiculopathy, cervicothoracic region: Secondary | ICD-10-CM | POA: Diagnosis not present

## 2016-06-27 DIAGNOSIS — M546 Pain in thoracic spine: Secondary | ICD-10-CM | POA: Diagnosis not present

## 2016-06-27 DIAGNOSIS — M5413 Radiculopathy, cervicothoracic region: Secondary | ICD-10-CM | POA: Diagnosis not present

## 2016-06-27 DIAGNOSIS — M9902 Segmental and somatic dysfunction of thoracic region: Secondary | ICD-10-CM | POA: Diagnosis not present

## 2016-06-27 DIAGNOSIS — M9901 Segmental and somatic dysfunction of cervical region: Secondary | ICD-10-CM | POA: Diagnosis not present

## 2016-06-29 DIAGNOSIS — M546 Pain in thoracic spine: Secondary | ICD-10-CM | POA: Diagnosis not present

## 2016-06-29 DIAGNOSIS — M9902 Segmental and somatic dysfunction of thoracic region: Secondary | ICD-10-CM | POA: Diagnosis not present

## 2016-06-29 DIAGNOSIS — M5413 Radiculopathy, cervicothoracic region: Secondary | ICD-10-CM | POA: Diagnosis not present

## 2016-06-29 DIAGNOSIS — M9901 Segmental and somatic dysfunction of cervical region: Secondary | ICD-10-CM | POA: Diagnosis not present

## 2016-07-02 DIAGNOSIS — M9901 Segmental and somatic dysfunction of cervical region: Secondary | ICD-10-CM | POA: Diagnosis not present

## 2016-07-02 DIAGNOSIS — M546 Pain in thoracic spine: Secondary | ICD-10-CM | POA: Diagnosis not present

## 2016-07-02 DIAGNOSIS — M9902 Segmental and somatic dysfunction of thoracic region: Secondary | ICD-10-CM | POA: Diagnosis not present

## 2016-07-02 DIAGNOSIS — M5413 Radiculopathy, cervicothoracic region: Secondary | ICD-10-CM | POA: Diagnosis not present

## 2016-07-04 DIAGNOSIS — L570 Actinic keratosis: Secondary | ICD-10-CM | POA: Diagnosis not present

## 2016-07-04 DIAGNOSIS — Z85828 Personal history of other malignant neoplasm of skin: Secondary | ICD-10-CM | POA: Diagnosis not present

## 2016-07-04 DIAGNOSIS — C44519 Basal cell carcinoma of skin of other part of trunk: Secondary | ICD-10-CM | POA: Diagnosis not present

## 2016-07-04 DIAGNOSIS — L820 Inflamed seborrheic keratosis: Secondary | ICD-10-CM | POA: Diagnosis not present

## 2016-07-04 DIAGNOSIS — D485 Neoplasm of uncertain behavior of skin: Secondary | ICD-10-CM | POA: Diagnosis not present

## 2016-07-06 DIAGNOSIS — M546 Pain in thoracic spine: Secondary | ICD-10-CM | POA: Diagnosis not present

## 2016-07-06 DIAGNOSIS — M9901 Segmental and somatic dysfunction of cervical region: Secondary | ICD-10-CM | POA: Diagnosis not present

## 2016-07-06 DIAGNOSIS — M9902 Segmental and somatic dysfunction of thoracic region: Secondary | ICD-10-CM | POA: Diagnosis not present

## 2016-07-06 DIAGNOSIS — M5413 Radiculopathy, cervicothoracic region: Secondary | ICD-10-CM | POA: Diagnosis not present

## 2016-07-09 DIAGNOSIS — M5413 Radiculopathy, cervicothoracic region: Secondary | ICD-10-CM | POA: Diagnosis not present

## 2016-07-09 DIAGNOSIS — M9901 Segmental and somatic dysfunction of cervical region: Secondary | ICD-10-CM | POA: Diagnosis not present

## 2016-07-09 DIAGNOSIS — M9902 Segmental and somatic dysfunction of thoracic region: Secondary | ICD-10-CM | POA: Diagnosis not present

## 2016-07-09 DIAGNOSIS — M546 Pain in thoracic spine: Secondary | ICD-10-CM | POA: Diagnosis not present

## 2016-07-12 DIAGNOSIS — M546 Pain in thoracic spine: Secondary | ICD-10-CM | POA: Diagnosis not present

## 2016-07-12 DIAGNOSIS — M5413 Radiculopathy, cervicothoracic region: Secondary | ICD-10-CM | POA: Diagnosis not present

## 2016-07-12 DIAGNOSIS — M9902 Segmental and somatic dysfunction of thoracic region: Secondary | ICD-10-CM | POA: Diagnosis not present

## 2016-07-12 DIAGNOSIS — M9901 Segmental and somatic dysfunction of cervical region: Secondary | ICD-10-CM | POA: Diagnosis not present

## 2016-07-18 DIAGNOSIS — M5413 Radiculopathy, cervicothoracic region: Secondary | ICD-10-CM | POA: Diagnosis not present

## 2016-07-18 DIAGNOSIS — M9901 Segmental and somatic dysfunction of cervical region: Secondary | ICD-10-CM | POA: Diagnosis not present

## 2016-07-18 DIAGNOSIS — M9902 Segmental and somatic dysfunction of thoracic region: Secondary | ICD-10-CM | POA: Diagnosis not present

## 2016-07-18 DIAGNOSIS — M546 Pain in thoracic spine: Secondary | ICD-10-CM | POA: Diagnosis not present

## 2016-07-19 DIAGNOSIS — L988 Other specified disorders of the skin and subcutaneous tissue: Secondary | ICD-10-CM | POA: Diagnosis not present

## 2016-07-19 DIAGNOSIS — C44519 Basal cell carcinoma of skin of other part of trunk: Secondary | ICD-10-CM | POA: Diagnosis not present

## 2016-07-25 DIAGNOSIS — M9902 Segmental and somatic dysfunction of thoracic region: Secondary | ICD-10-CM | POA: Diagnosis not present

## 2016-07-25 DIAGNOSIS — M546 Pain in thoracic spine: Secondary | ICD-10-CM | POA: Diagnosis not present

## 2016-07-25 DIAGNOSIS — M5413 Radiculopathy, cervicothoracic region: Secondary | ICD-10-CM | POA: Diagnosis not present

## 2016-07-25 DIAGNOSIS — M9901 Segmental and somatic dysfunction of cervical region: Secondary | ICD-10-CM | POA: Diagnosis not present

## 2016-08-02 DIAGNOSIS — L570 Actinic keratosis: Secondary | ICD-10-CM | POA: Diagnosis not present

## 2016-08-22 DIAGNOSIS — M546 Pain in thoracic spine: Secondary | ICD-10-CM | POA: Diagnosis not present

## 2016-08-22 DIAGNOSIS — M9902 Segmental and somatic dysfunction of thoracic region: Secondary | ICD-10-CM | POA: Diagnosis not present

## 2016-08-22 DIAGNOSIS — M9901 Segmental and somatic dysfunction of cervical region: Secondary | ICD-10-CM | POA: Diagnosis not present

## 2016-08-22 DIAGNOSIS — M5413 Radiculopathy, cervicothoracic region: Secondary | ICD-10-CM | POA: Diagnosis not present

## 2016-08-30 DIAGNOSIS — M546 Pain in thoracic spine: Secondary | ICD-10-CM | POA: Diagnosis not present

## 2016-08-30 DIAGNOSIS — M9902 Segmental and somatic dysfunction of thoracic region: Secondary | ICD-10-CM | POA: Diagnosis not present

## 2016-08-30 DIAGNOSIS — M9901 Segmental and somatic dysfunction of cervical region: Secondary | ICD-10-CM | POA: Diagnosis not present

## 2016-08-30 DIAGNOSIS — M5413 Radiculopathy, cervicothoracic region: Secondary | ICD-10-CM | POA: Diagnosis not present

## 2016-09-27 DIAGNOSIS — M9902 Segmental and somatic dysfunction of thoracic region: Secondary | ICD-10-CM | POA: Diagnosis not present

## 2016-09-27 DIAGNOSIS — M5413 Radiculopathy, cervicothoracic region: Secondary | ICD-10-CM | POA: Diagnosis not present

## 2016-09-27 DIAGNOSIS — M9901 Segmental and somatic dysfunction of cervical region: Secondary | ICD-10-CM | POA: Diagnosis not present

## 2016-09-27 DIAGNOSIS — M546 Pain in thoracic spine: Secondary | ICD-10-CM | POA: Diagnosis not present

## 2016-10-25 DIAGNOSIS — M9902 Segmental and somatic dysfunction of thoracic region: Secondary | ICD-10-CM | POA: Diagnosis not present

## 2016-10-25 DIAGNOSIS — M5413 Radiculopathy, cervicothoracic region: Secondary | ICD-10-CM | POA: Diagnosis not present

## 2016-10-25 DIAGNOSIS — M546 Pain in thoracic spine: Secondary | ICD-10-CM | POA: Diagnosis not present

## 2016-10-25 DIAGNOSIS — M9901 Segmental and somatic dysfunction of cervical region: Secondary | ICD-10-CM | POA: Diagnosis not present

## 2016-11-17 DIAGNOSIS — M25562 Pain in left knee: Secondary | ICD-10-CM | POA: Diagnosis not present

## 2016-11-17 DIAGNOSIS — Z6831 Body mass index (BMI) 31.0-31.9, adult: Secondary | ICD-10-CM | POA: Diagnosis not present

## 2016-11-22 ENCOUNTER — Other Ambulatory Visit: Payer: Self-pay | Admitting: Internal Medicine

## 2016-11-30 DIAGNOSIS — I10 Essential (primary) hypertension: Secondary | ICD-10-CM | POA: Diagnosis not present

## 2016-12-04 DIAGNOSIS — F411 Generalized anxiety disorder: Secondary | ICD-10-CM | POA: Diagnosis not present

## 2016-12-04 DIAGNOSIS — K219 Gastro-esophageal reflux disease without esophagitis: Secondary | ICD-10-CM | POA: Diagnosis not present

## 2016-12-04 DIAGNOSIS — K589 Irritable bowel syndrome without diarrhea: Secondary | ICD-10-CM | POA: Diagnosis not present

## 2016-12-04 DIAGNOSIS — I10 Essential (primary) hypertension: Secondary | ICD-10-CM | POA: Diagnosis not present

## 2016-12-04 DIAGNOSIS — L989 Disorder of the skin and subcutaneous tissue, unspecified: Secondary | ICD-10-CM | POA: Diagnosis not present

## 2016-12-04 DIAGNOSIS — M1711 Unilateral primary osteoarthritis, right knee: Secondary | ICD-10-CM | POA: Diagnosis not present

## 2016-12-04 DIAGNOSIS — E782 Mixed hyperlipidemia: Secondary | ICD-10-CM | POA: Diagnosis not present

## 2017-01-17 DIAGNOSIS — M9901 Segmental and somatic dysfunction of cervical region: Secondary | ICD-10-CM | POA: Diagnosis not present

## 2017-01-17 DIAGNOSIS — M546 Pain in thoracic spine: Secondary | ICD-10-CM | POA: Diagnosis not present

## 2017-01-17 DIAGNOSIS — M5413 Radiculopathy, cervicothoracic region: Secondary | ICD-10-CM | POA: Diagnosis not present

## 2017-01-17 DIAGNOSIS — M9902 Segmental and somatic dysfunction of thoracic region: Secondary | ICD-10-CM | POA: Diagnosis not present

## 2017-01-21 DIAGNOSIS — Z85828 Personal history of other malignant neoplasm of skin: Secondary | ICD-10-CM | POA: Diagnosis not present

## 2017-01-21 DIAGNOSIS — L570 Actinic keratosis: Secondary | ICD-10-CM | POA: Diagnosis not present

## 2017-01-21 DIAGNOSIS — L918 Other hypertrophic disorders of the skin: Secondary | ICD-10-CM | POA: Diagnosis not present

## 2017-01-21 DIAGNOSIS — D485 Neoplasm of uncertain behavior of skin: Secondary | ICD-10-CM | POA: Diagnosis not present

## 2017-01-30 DIAGNOSIS — Z23 Encounter for immunization: Secondary | ICD-10-CM | POA: Diagnosis not present

## 2017-02-25 ENCOUNTER — Other Ambulatory Visit: Payer: Self-pay | Admitting: Gastroenterology

## 2017-03-15 DIAGNOSIS — Z6830 Body mass index (BMI) 30.0-30.9, adult: Secondary | ICD-10-CM | POA: Diagnosis not present

## 2017-03-15 DIAGNOSIS — L0201 Cutaneous abscess of face: Secondary | ICD-10-CM | POA: Diagnosis not present

## 2017-03-19 DIAGNOSIS — L0201 Cutaneous abscess of face: Secondary | ICD-10-CM | POA: Diagnosis not present

## 2017-03-19 DIAGNOSIS — Z6830 Body mass index (BMI) 30.0-30.9, adult: Secondary | ICD-10-CM | POA: Diagnosis not present

## 2017-03-21 DIAGNOSIS — L0201 Cutaneous abscess of face: Secondary | ICD-10-CM | POA: Diagnosis not present

## 2017-03-21 DIAGNOSIS — L0100 Impetigo, unspecified: Secondary | ICD-10-CM | POA: Diagnosis not present

## 2017-03-27 ENCOUNTER — Encounter: Payer: Self-pay | Admitting: Internal Medicine

## 2017-04-04 DIAGNOSIS — M9902 Segmental and somatic dysfunction of thoracic region: Secondary | ICD-10-CM | POA: Diagnosis not present

## 2017-04-04 DIAGNOSIS — M5413 Radiculopathy, cervicothoracic region: Secondary | ICD-10-CM | POA: Diagnosis not present

## 2017-04-04 DIAGNOSIS — M9901 Segmental and somatic dysfunction of cervical region: Secondary | ICD-10-CM | POA: Diagnosis not present

## 2017-04-04 DIAGNOSIS — M546 Pain in thoracic spine: Secondary | ICD-10-CM | POA: Diagnosis not present

## 2017-04-25 DIAGNOSIS — D485 Neoplasm of uncertain behavior of skin: Secondary | ICD-10-CM | POA: Diagnosis not present

## 2017-04-25 DIAGNOSIS — L720 Epidermal cyst: Secondary | ICD-10-CM | POA: Diagnosis not present

## 2017-06-18 DIAGNOSIS — M546 Pain in thoracic spine: Secondary | ICD-10-CM | POA: Diagnosis not present

## 2017-06-18 DIAGNOSIS — M9902 Segmental and somatic dysfunction of thoracic region: Secondary | ICD-10-CM | POA: Diagnosis not present

## 2017-06-18 DIAGNOSIS — M545 Low back pain: Secondary | ICD-10-CM | POA: Diagnosis not present

## 2017-06-18 DIAGNOSIS — M9903 Segmental and somatic dysfunction of lumbar region: Secondary | ICD-10-CM | POA: Diagnosis not present

## 2017-06-18 DIAGNOSIS — M9901 Segmental and somatic dysfunction of cervical region: Secondary | ICD-10-CM | POA: Diagnosis not present

## 2017-06-18 DIAGNOSIS — M5413 Radiculopathy, cervicothoracic region: Secondary | ICD-10-CM | POA: Diagnosis not present

## 2017-06-28 ENCOUNTER — Encounter: Payer: Self-pay | Admitting: *Deleted

## 2017-06-28 ENCOUNTER — Ambulatory Visit (INDEPENDENT_AMBULATORY_CARE_PROVIDER_SITE_OTHER): Payer: PPO | Admitting: Internal Medicine

## 2017-06-28 ENCOUNTER — Other Ambulatory Visit: Payer: Self-pay | Admitting: *Deleted

## 2017-06-28 ENCOUNTER — Telehealth: Payer: Self-pay | Admitting: *Deleted

## 2017-06-28 ENCOUNTER — Encounter: Payer: Self-pay | Admitting: Internal Medicine

## 2017-06-28 VITALS — BP 119/81 | HR 74 | Temp 97.2°F | Ht 64.0 in | Wt 181.0 lb

## 2017-06-28 DIAGNOSIS — Z860100 Personal history of colon polyps, unspecified: Secondary | ICD-10-CM

## 2017-06-28 DIAGNOSIS — Z800 Family history of malignant neoplasm of digestive organs: Secondary | ICD-10-CM

## 2017-06-28 DIAGNOSIS — K219 Gastro-esophageal reflux disease without esophagitis: Secondary | ICD-10-CM

## 2017-06-28 DIAGNOSIS — K5909 Other constipation: Secondary | ICD-10-CM

## 2017-06-28 MED ORDER — PEG 3350-KCL-NA BICARB-NACL 420 G PO SOLR: 4000.0000 mL | Freq: Once | ORAL | 0 refills | Status: AC

## 2017-06-28 NOTE — Progress Notes (Signed)
Primary Care Physician:  Celene Squibb, MD Primary Gastroenterologist:  Dr. Gala Romney  Pre-Procedure History & Physical: HPI:  Kendra Orozco is a 72 y.o. female here for for follow-up GERD and set up a surveillance colonoscopy. Reflux symptoms continue to be well controlled on Rabeprazole 20 mg daily. No dysphagia. Hemorrhoid symptoms have settled down since she got her bowels moving better. Specifically, she takes Amitiza and Benefiber daily with excellent control of constipation. She hasn't had any rectal bleeding.  History of a colonic adenoma removed 2014 and a positive family history of colon cancer; due for surveillance examination this year.  Past Medical History:  Diagnosis Date  . GERD (gastroesophageal reflux disease)   . Hypertension     Past Surgical History:  Procedure Laterality Date  . ABDOMINAL HYSTERECTOMY     fibroid tumors  . BACK SURGERY    . COLONOSCOPY  09/16/2007   [id number] canal hemorrhoids, otherwise normal rectum left-sided diverticula and colonic mucosa appeared normal.  . COLONOSCOPY N/A 03/02/2013   Dr. Chauncy Lean adenoma. Colonic diverticulosis. Anal canal hemorrhoids-likely source of hematochezia. Surveillance 2019  . ESOPHAGOGASTRODUODENOSCOPY  09/16/2007   [id number] undulating Z-line, tiny distal esophageal  erosions consistent with mild erosive reflux esophagitis, patulous EG junction, small hiatal hernia, otherwise normal stomach, D1, and D2.  . HEMORRHOID SURGERY     X 2     Prior to Admission medications   Medication Sig Start Date End Date Taking? Authorizing Provider  AMITIZA 24 MCG capsule TAKE ONE CAPSULE BY MOUTH TWICE DAILY WITH A MEAL. 02/28/17  Yes Carlis Stable, NP  calcium carbonate (OS-CAL) 1250 MG chewable tablet Chew 1 tablet by mouth every other day.    Yes [provider]  Cholecalciferol ([facility] VIT D 5000 HIGH-POTENCY) 5000 UNITS capsule Take 5,000 Units by mouth daily.    Yes [provider]  docusate  sodium (COLACE) 100 MG capsule Take 100 mg by mouth as needed for constipation.   Yes [provider]  EXFORGE HCT 10-320-25 MG TABS Take by mouth daily.  01/05/13  Yes [provider]  ibuprofen (ADVIL,MOTRIN) 200 MG tablet Take 800 mg by mouth every 8 (eight) hours as needed for pain.   Yes [provider]  levocetirizine (XYZAL) 5 MG tablet Take 5 mg by mouth as needed for allergies.   Yes [provider]  LORazepam (ATIVAN) 0.5 MG tablet Take 0.5 mg by mouth every 8 (eight) hours as needed.    Yes [provider]  montelukast (SINGULAIR) 10 MG tablet Take 10 mg by mouth 2 (two) times daily.  10/29/14  Yes [provider]  Multiple Vitamin (MULTIVITAMIN WITH MINERALS) TABS tablet Take 1 tablet by mouth daily.   Yes [provider]  pantoprazole (PROTONIX) 40 MG tablet Take 1 tablet (40 mg total) by mouth daily. 09/30/15  Yes Rourk, Cristopher Estimable, MD  RABEprazole (ACIPHEX) 20 MG tablet TAKE ONE TABLET BY MOUTH DAILY. 02/28/17  Yes Carlis Stable, NP  Wheat Dextrin (BENEFIBER) POWD Take by mouth. 1 packet=1 tbsp once or twice a day   Yes [provider]  amoxicillin-clavulanate (AUGMENTIN) 875-125 MG per tablet Take 1 tablet by mouth 2 (two) times daily.  10/29/14   [provider]  cyclobenzaprine (FLEXERIL) 10 MG tablet Take 10 mg by mouth as needed for muscle spasms.    [provider]  doxycycline (MONODOX) 100 MG capsule Take 100 mg by mouth 2 (two) times daily.  12/28/12  [provider]    Allergies as of 06/28/2017 - Review Complete 06/28/2017  Allergen Reaction Noted  . Latex  02/16/2013  . Sulfa antibiotics Rash 02/11/2013    Family History  Problem Relation Age of Onset  . Colon cancer Brother        61, deceased    Social History   Socioeconomic History  . Marital status: Divorced    Spouse name: Not on file  . Number of children: Not on file  . Years of education: Not on file  .  Highest education level: Not on file  Social Needs  . Financial resource strain: Not on file  . Food insecurity - worry: Not on file  . Food insecurity - inability: Not on file  . Transportation needs - medical: Not on file  . Transportation needs - non-medical: Not on file  Occupational History  . Not on file  Tobacco Use  . Smoking status: Never Smoker  . Smokeless tobacco: Never Used  Substance and Sexual Activity  . Alcohol use: Yes    Comment: occ  . Drug use: No  . Sexual activity: Not on file  Other Topics Concern  . Not on file  Social History Narrative  . Not on file    Review of Systems: See HPI, otherwise negative ROS  Physical Exam: BP 119/81   Pulse 74   Temp (!) 97.2 F (36.2 C) (Oral)   Ht 5\' 4"  (1.626 m)   Wt 181 lb (82.1 kg)   BMI 31.07 kg/m  General:   Alert,   pleasant and cooperative in NAD Neck:  Supple; no masses or thyromegaly. No significant cervical adenopathy. Lungs:  Clear throughout to auscultation.   No wheezes, crackles, or rhonchi. No acute distress. Heart:  Regular rate and rhythm; no murmurs, clicks, rubs,  or gallops. Abdomen: Non-distended, normal bowel sounds.  Soft and nontender without appreciable mass or hepatosplenomegaly.  Pulses:  Normal pulses noted. Extremities:  Without clubbing or edema.  Impression:  Pleasant 72 year old lady with  GERD - well controlled on rabeprazole 20 mg daily. No alarm symptoms.  Chronic constipation well-managed on Benefiber and Amitiza  History colonic adenoma and a positive family history of colon cancer; due for some aunts colonoscopy nowl.  Recommendations:  I have offered the patient a suveillance colonoscopy.  The risks, benefits, limitations, alternatives and imponderables have been reviewed with the patient. Questions have been answered. All parties are agreeable.   Schedule a surveillance colonoscopy - hx of polyps and a positive family hx of colon cancer - Propofol  Continue Aciphex  / Rabeprazole 20 mg daily  Continue Benefiber and Amitiza  Further recommendations to follow    Notice: This dictation was prepared with Dragon dictation along with smaller phrase technology. Any transcriptional errors that result from this process are unintentional and may not be corrected upon review.

## 2017-06-28 NOTE — Telephone Encounter (Signed)
Pre-op scheduled for 07/22/17 at 1:45pm. Letter mailed

## 2017-06-28 NOTE — Patient Instructions (Signed)
Schedule a surveillance colonoscopy - hx of polyps and a positive family hx of colon cancer - Propofol  Continue Aciphex / Rabeprazole 20 mg daily  Further recommendations to follow

## 2017-07-04 DIAGNOSIS — E782 Mixed hyperlipidemia: Secondary | ICD-10-CM | POA: Diagnosis not present

## 2017-07-04 DIAGNOSIS — F411 Generalized anxiety disorder: Secondary | ICD-10-CM | POA: Diagnosis not present

## 2017-07-04 DIAGNOSIS — L0201 Cutaneous abscess of face: Secondary | ICD-10-CM | POA: Diagnosis not present

## 2017-07-04 DIAGNOSIS — G5600 Carpal tunnel syndrome, unspecified upper limb: Secondary | ICD-10-CM | POA: Diagnosis not present

## 2017-07-04 DIAGNOSIS — F5101 Primary insomnia: Secondary | ICD-10-CM | POA: Diagnosis not present

## 2017-07-04 DIAGNOSIS — J45909 Unspecified asthma, uncomplicated: Secondary | ICD-10-CM | POA: Diagnosis not present

## 2017-07-04 DIAGNOSIS — I10 Essential (primary) hypertension: Secondary | ICD-10-CM | POA: Diagnosis not present

## 2017-07-04 DIAGNOSIS — K589 Irritable bowel syndrome without diarrhea: Secondary | ICD-10-CM | POA: Diagnosis not present

## 2017-07-04 DIAGNOSIS — K219 Gastro-esophageal reflux disease without esophagitis: Secondary | ICD-10-CM | POA: Diagnosis not present

## 2017-07-04 DIAGNOSIS — Z6830 Body mass index (BMI) 30.0-30.9, adult: Secondary | ICD-10-CM | POA: Diagnosis not present

## 2017-07-08 DIAGNOSIS — Z6829 Body mass index (BMI) 29.0-29.9, adult: Secondary | ICD-10-CM | POA: Diagnosis not present

## 2017-07-08 DIAGNOSIS — M1711 Unilateral primary osteoarthritis, right knee: Secondary | ICD-10-CM | POA: Diagnosis not present

## 2017-07-08 DIAGNOSIS — F411 Generalized anxiety disorder: Secondary | ICD-10-CM | POA: Diagnosis not present

## 2017-07-08 DIAGNOSIS — Z0000 Encounter for general adult medical examination without abnormal findings: Secondary | ICD-10-CM | POA: Diagnosis not present

## 2017-07-08 DIAGNOSIS — K219 Gastro-esophageal reflux disease without esophagitis: Secondary | ICD-10-CM | POA: Diagnosis not present

## 2017-07-08 DIAGNOSIS — K589 Irritable bowel syndrome without diarrhea: Secondary | ICD-10-CM | POA: Diagnosis not present

## 2017-07-08 DIAGNOSIS — Z6830 Body mass index (BMI) 30.0-30.9, adult: Secondary | ICD-10-CM | POA: Diagnosis not present

## 2017-07-08 DIAGNOSIS — I10 Essential (primary) hypertension: Secondary | ICD-10-CM | POA: Diagnosis not present

## 2017-07-08 DIAGNOSIS — M25561 Pain in right knee: Secondary | ICD-10-CM | POA: Diagnosis not present

## 2017-07-08 DIAGNOSIS — E782 Mixed hyperlipidemia: Secondary | ICD-10-CM | POA: Diagnosis not present

## 2017-07-09 ENCOUNTER — Other Ambulatory Visit: Payer: Self-pay | Admitting: Internal Medicine

## 2017-07-09 DIAGNOSIS — Z139 Encounter for screening, unspecified: Secondary | ICD-10-CM

## 2017-07-09 DIAGNOSIS — L821 Other seborrheic keratosis: Secondary | ICD-10-CM | POA: Diagnosis not present

## 2017-07-09 DIAGNOSIS — Z85828 Personal history of other malignant neoplasm of skin: Secondary | ICD-10-CM | POA: Diagnosis not present

## 2017-07-09 DIAGNOSIS — L570 Actinic keratosis: Secondary | ICD-10-CM | POA: Diagnosis not present

## 2017-07-10 ENCOUNTER — Other Ambulatory Visit: Payer: Self-pay | Admitting: Internal Medicine

## 2017-07-10 DIAGNOSIS — E2839 Other primary ovarian failure: Secondary | ICD-10-CM

## 2017-07-22 ENCOUNTER — Other Ambulatory Visit (HOSPITAL_COMMUNITY): Payer: PPO

## 2017-07-23 NOTE — Patient Instructions (Signed)
Kendra Orozco  07/23/2017     @PREFPERIOPPHARMACY @   Your procedure is scheduled on  07/29/2017   Report to [facility] at  1100   A.M.  Call this number if you have problems the morning of surgery:  [phone]   Remember:  Do not eat food or drink liquids after midnight.  Take these medicines the morning of surgery with A SIP OF WATER  Exforge, ativan, mobic, robaxin, aciphex. Use your inhaler before you come.   Do not wear jewelry, make-up or nail polish.  Do not wear lotions, powders, or perfumes, or deodorant.  Do not shave 48 hours prior to surgery.  Men may shave face and neck.  Do not bring valuables to the hospital.  [facility] is not responsible for any belongings or valuables.  Contacts, dentures or bridgework may not be worn into surgery.  Leave your suitcase in the car.  After surgery it may be brought to your room.  For patients admitted to the hospital, discharge time will be determined by your treatment team.  Patients discharged the day of surgery will not be allowed to drive home.   Name and phone number of your driver:   family Special instructions:  Follow the diet and prep instructions given to you by Dr Roseanne Kaufman office.  Please read over the following fact sheets that you were given. Anesthesia Post-op Instructions and Care and Recovery After Surgery       Colonoscopy, Adult A colonoscopy is an exam to look at the large intestine. It is done to check for problems, such as:  Lumps (tumors).  Growths (polyps).  Swelling (inflammation).  Bleeding.  What happens before the procedure? Eating and drinking Follow instructions from your doctor about eating and drinking. These instructions may include:  A few days before the procedure - follow a low-fiber diet. ? Avoid nuts. ? Avoid seeds. ? Avoid dried fruit. ? Avoid raw fruits. ? Avoid vegetables.  1-3 days before the procedure - follow a clear liquid diet. Avoid liquids that  have red or purple dye. Drink only clear liquids, such as: ? Clear broth or bouillon. ? Black coffee or tea. ? Clear juice. ? Clear soft drinks or sports drinks. ? Gelatin dessert. ? Popsicles.  On the day of the procedure - do not eat or drink anything during the 2 hours before the procedure.  Bowel prep If you were prescribed an oral bowel prep:  Take it as told by your doctor. Starting the day before your procedure, you will need to drink a lot of liquid. The liquid will cause you to poop (have bowel movements) until your poop is almost clear or light green.  If your skin or butt gets irritated from diarrhea, you may: ? Wipe the area with wipes that have medicine in them, such as adult wet wipes with aloe and vitamin E. ? Put something on your skin that soothes the area, such as petroleum jelly.  If you throw up (vomit) while drinking the bowel prep, take a break for up to 60 minutes. Then begin the bowel prep again. If you keep throwing up and you cannot take the bowel prep without throwing up, call your doctor.  General instructions  Ask your doctor about changing or stopping your normal medicines. This is important if you take diabetes medicines or blood thinners.  Plan to have someone take you home from the hospital or clinic. What  happens during the procedure?  An IV tube may be put into one of your veins.  You will be given medicine to help you relax (sedative).  To reduce your risk of infection: ? Your doctors will wash their hands. ? Your anal area will be washed with soap.  You will be asked to lie on your side with your knees bent.  Your doctor will get a long, thin, flexible tube ready. The tube will have a camera and a light on the end.  The tube will be put into your anus.  The tube will be gently put into your large intestine.  Air will be delivered into your large intestine to keep it open. You may feel some pressure or cramping.  The camera will be  used to take photos.  A small tissue sample may be removed from your body to be looked at under a microscope (biopsy). If any possible problems are found, the tissue will be sent to a lab for testing.  If small growths are found, your doctor may remove them and have them checked for cancer.  The tube that was put into your anus will be slowly removed. The procedure may vary among doctors and hospitals. What happens after the procedure?  Your doctor will check on you often until the medicines you were given have worn off.  Do not drive for 24 hours after the procedure.  You may have a small amount of blood in your poop.  You may pass gas.  You may have mild cramps or bloating in your belly (abdomen).  It is up to you to get the results of your procedure. Ask your doctor, or the department performing the procedure, when your results will be ready. This information is not intended to replace advice given to you by your health care provider. Make sure you discuss any questions you have with your health care provider. Document Released: 05/05/2010 Document Revised: 02/01/2016 Document Reviewed: 06/14/2015 Elsevier Interactive Patient Education  2017 [employer]  Colonoscopy, Adult, Care After This sheet gives you information about how to care for yourself after your procedure. Your health care provider may also give you more specific instructions. If you have problems or questions, contact your health care provider. What can I expect after the procedure? After the procedure, it is common to have:  A small amount of blood in your stool for 24 hours after the procedure.  Some gas.  Mild abdominal cramping or bloating.  Follow these instructions at home: General instructions   For the first 24 hours after the procedure: ? Do not drive or use machinery. ? Do not sign important documents. ? Do not drink alcohol. ? Do your regular daily activities at a slower pace than  normal. ? Eat soft, easy-to-digest foods. ? Rest often.  Take over-the-counter or prescription medicines only as told by your health care provider.  It is up to you to get the results of your procedure. Ask your health care provider, or the department performing the procedure, when your results will be ready. Relieving cramping and bloating  Try walking around when you have cramps or feel bloated.  Apply heat to your abdomen as told by your health care provider. Use a heat source that your health care provider recommends, such as a moist heat pack or a heating pad. ? Place a towel between your skin and the heat source. ? Leave the heat on for 20-30 minutes. ? Remove the heat if your skin  turns bright red. This is especially important if you are unable to feel pain, heat, or cold. You may have a greater risk of getting burned. Eating and drinking  Drink enough fluid to keep your urine clear or pale yellow.  Resume your normal diet as instructed by your health care provider. Avoid heavy or fried foods that are hard to digest.  Avoid drinking alcohol for as long as instructed by your health care provider. Contact a health care provider if:  You have blood in your stool 2-3 days after the procedure. Get help right away if:  You have more than a small spotting of blood in your stool.  You pass large blood clots in your stool.  Your abdomen is swollen.  You have nausea or vomiting.  You have a fever.  You have increasing abdominal pain that is not relieved with medicine. This information is not intended to replace advice given to you by your health care provider. Make sure you discuss any questions you have with your health care provider. Document Released: 11/15/2003 Document Revised: 12/26/2015 Document Reviewed: 06/14/2015 Elsevier Interactive Patient Education  2018 [place] Anesthesia is a term that refers to techniques, procedures, and  medicines that help a person stay safe and comfortable during a medical procedure. Monitored anesthesia care, or sedation, is one type of anesthesia. Your anesthesia specialist may recommend sedation if you will be having a procedure that does not require you to be unconscious, such as:  Cataract surgery.  A dental procedure.  A biopsy.  A colonoscopy.  During the procedure, you may receive a medicine to help you relax (sedative). There are three levels of sedation:  Mild sedation. At this level, you may feel awake and relaxed. You will be able to follow directions.  Moderate sedation. At this level, you will be sleepy. You may not remember the procedure.  Deep sedation. At this level, you will be asleep. You will not remember the procedure.  The more medicine you are given, the deeper your level of sedation will be. Depending on how you respond to the procedure, the anesthesia specialist may change your level of sedation or the type of anesthesia to fit your needs. An anesthesia specialist will monitor you closely during the procedure. Let your health care provider know about:  Any allergies you have.  All medicines you are taking, including vitamins, herbs, eye drops, creams, and over-the-counter medicines.  Any use of steroids (by mouth or as a cream).  Any problems you or family members have had with sedatives and anesthetic medicines.  Any blood disorders you have.  Any surgeries you have had.  Any medical conditions you have, such as sleep apnea.  Whether you are pregnant or may be pregnant.  Any use of cigarettes, alcohol, or street drugs. What are the risks? Generally, this is a safe procedure. However, problems may occur, including:  Getting too much medicine (oversedation).  Nausea.  Allergic reaction to medicines.  Trouble breathing. If this happens, a breathing tube may be used to help with breathing. It will be removed when you are awake and breathing on  your own.  Heart trouble.  Lung trouble.  Before the procedure Staying hydrated Follow instructions from your health care provider about hydration, which may include:  Up to 2 hours before the procedure - you may continue to drink clear liquids, such as water, clear fruit juice, black coffee, and plain tea.  Eating and drinking restrictions Follow instructions  from your health care provider about eating and drinking, which may include:  8 hours before the procedure - stop eating heavy meals or foods such as meat, fried foods, or fatty foods.  6 hours before the procedure - stop eating light meals or foods, such as toast or cereal.  6 hours before the procedure - stop drinking milk or drinks that contain milk.  2 hours before the procedure - stop drinking clear liquids.  Medicines Ask your health care provider about:  Changing or stopping your regular medicines. This is especially important if you are taking diabetes medicines or blood thinners.  Taking medicines such as aspirin and ibuprofen. These medicines can thin your blood. Do not take these medicines before your procedure if your health care provider instructs you not to.  Tests and exams  You will have a physical exam.  You may have blood tests done to show: ? How well your kidneys and liver are working. ? How well your blood can clot.  General instructions  Plan to have someone take you home from the hospital or clinic.  If you will be going home right after the procedure, plan to have someone with you for 24 hours.  What happens during the procedure?  Your blood pressure, heart rate, breathing, level of pain and overall condition will be monitored.  An IV tube will be inserted into one of your veins.  Your anesthesia specialist will give you medicines as needed to keep you comfortable during the procedure. This may mean changing the level of sedation.  The procedure will be performed. After the  procedure  Your blood pressure, heart rate, breathing rate, and blood oxygen level will be monitored until the medicines you were given have worn off.  Do not drive for 24 hours if you received a sedative.  You may: ? Feel sleepy, clumsy, or nauseous. ? Feel forgetful about what happened after the procedure. ? Have a sore throat if you had a breathing tube during the procedure. ? Vomit. This information is not intended to replace advice given to you by your health care provider. Make sure you discuss any questions you have with your health care provider. Document Released: 12/27/2004 Document Revised: 09/09/2015 Document Reviewed: 07/24/2015 Elsevier Interactive Patient Education  2018 [place], Care After These instructions provide you with information about caring for yourself after your procedure. Your health care provider may also give you more specific instructions. Your treatment has been planned according to current medical practices, but problems sometimes occur. Call your health care provider if you have any problems or questions after your procedure. What can I expect after the procedure? After your procedure, it is common to:  Feel sleepy for several hours.  Feel clumsy and have poor balance for several hours.  Feel forgetful about what happened after the procedure.  Have poor judgment for several hours.  Feel nauseous or vomit.  Have a sore throat if you had a breathing tube during the procedure.  Follow these instructions at home: For at least 24 hours after the procedure:   Do not: ? Participate in activities in which you could fall or become injured. ? Drive. ? Use heavy machinery. ? Drink alcohol. ? Take sleeping pills or medicines that cause drowsiness. ? Make important decisions or sign legal documents. ? Take care of children on your own.  Rest. Eating and drinking  Follow the diet that is recommended by your health  care provider.  If  you vomit, drink water, juice, or soup when you can drink without vomiting.  Make sure you have little or no nausea before eating solid foods. General instructions  Have a responsible adult stay with you until you are awake and alert.  Take over-the-counter and prescription medicines only as told by your health care provider.  If you smoke, do not smoke without supervision.  Keep all follow-up visits as told by your health care provider. This is important. Contact a health care provider if:  You keep feeling nauseous or you keep vomiting.  You feel light-headed.  You develop a rash.  You have a fever. Get help right away if:  You have trouble breathing. This information is not intended to replace advice given to you by your health care provider. Make sure you discuss any questions you have with your health care provider. Document Released: 07/24/2015 Document Revised: 11/23/2015 Document Reviewed: 07/24/2015 Elsevier Interactive Patient Education  [employer].

## 2017-07-24 ENCOUNTER — Other Ambulatory Visit: Payer: Self-pay

## 2017-07-24 ENCOUNTER — Encounter (HOSPITAL_COMMUNITY)
Admission: RE | Admit: 2017-07-24 | Discharge: 2017-07-24 | Disposition: A | Discharge status: Home / Self Care | Payer: PPO | Source: Ambulatory Visit | Attending: Internal Medicine | Admitting: Internal Medicine

## 2017-07-24 ENCOUNTER — Encounter (HOSPITAL_COMMUNITY): Payer: Self-pay

## 2017-07-24 DIAGNOSIS — Z01818 Encounter for other preprocedural examination: Secondary | ICD-10-CM | POA: Insufficient documentation

## 2017-07-24 DIAGNOSIS — I493 Ventricular premature depolarization: Secondary | ICD-10-CM | POA: Diagnosis not present

## 2017-07-24 DIAGNOSIS — Z01810 Encounter for preprocedural cardiovascular examination: Secondary | ICD-10-CM | POA: Insufficient documentation

## 2017-07-24 DIAGNOSIS — Z86010 Personal history of colonic polyps: Secondary | ICD-10-CM | POA: Diagnosis not present

## 2017-07-24 DIAGNOSIS — Z800 Family history of malignant neoplasm of digestive organs: Secondary | ICD-10-CM | POA: Diagnosis not present

## 2017-07-24 HISTORY — DX: Unspecified asthma, uncomplicated: J45.909

## 2017-07-24 HISTORY — DX: Anxiety disorder, unspecified: F41.9

## 2017-07-24 HISTORY — DX: Unspecified osteoarthritis, unspecified site: M19.90

## 2017-07-24 HISTORY — DX: Malignant (primary) neoplasm, unspecified: C80.1

## 2017-07-24 HISTORY — DX: Gout, unspecified: M10.9

## 2017-07-24 LAB — CBC WITH DIFFERENTIAL/PLATELET
Basophils Absolute: 0 10*3/uL (ref 0.0–0.1)
Basophils Relative: 0 %
EOS PCT: 2 %
Eosinophils Absolute: 0.2 10*3/uL (ref 0.0–0.7)
HCT: 36.7 % (ref 36.0–46.0)
Hemoglobin: 11.9 g/dL — ABNORMAL LOW (ref 12.0–15.0)
LYMPHS ABS: 3.3 10*3/uL (ref 0.7–4.0)
LYMPHS PCT: 34 %
MCH: 27.5 pg (ref 26.0–34.0)
MCHC: 32.4 g/dL (ref 30.0–36.0)
MCV: 85 fL (ref 78.0–100.0)
MONO ABS: 0.7 10*3/uL (ref 0.1–1.0)
Monocytes Relative: 7 %
Neutro Abs: 5.5 10*3/uL (ref 1.7–7.7)
Neutrophils Relative %: 57 %
PLATELETS: 229 10*3/uL (ref 150–400)
RBC: 4.32 MIL/uL (ref 3.87–5.11)
RDW: 13.6 % (ref 11.5–15.5)
WBC: 9.8 10*3/uL (ref 4.0–10.5)

## 2017-07-24 LAB — BASIC METABOLIC PANEL
Anion gap: 13 (ref 5–15)
BUN: 20 mg/dL (ref 6–20)
CHLORIDE: 102 mmol/L (ref 101–111)
CO2: 25 mmol/L (ref 22–32)
Calcium: 9.2 mg/dL (ref 8.9–10.3)
Creatinine, Ser: 0.56 mg/dL (ref 0.44–1.00)
GFR calc Af Amer: >60 mL/min (ref >60)
GLUCOSE: 90 mg/dL (ref 65–99)
Potassium: 3.5 mmol/L (ref 3.5–5.1)
Sodium: 140 mmol/L (ref 135–145)

## 2017-07-29 ENCOUNTER — Ambulatory Visit (HOSPITAL_COMMUNITY): Payer: PPO | Admitting: Anesthesiology

## 2017-07-29 ENCOUNTER — Encounter (HOSPITAL_COMMUNITY)
Admission: RE | Disposition: A | Discharge status: Home / Self Care | Payer: Self-pay | Source: Ambulatory Visit | Attending: Internal Medicine

## 2017-07-29 ENCOUNTER — Encounter (HOSPITAL_COMMUNITY): Payer: Self-pay | Admitting: *Deleted

## 2017-07-29 ENCOUNTER — Ambulatory Visit (HOSPITAL_COMMUNITY)
Admission: RE | Admit: 2017-07-29 | Discharge: 2017-07-29 | Disposition: A | Discharge status: Home / Self Care | Payer: PPO | Source: Ambulatory Visit | Attending: Internal Medicine | Admitting: Internal Medicine

## 2017-07-29 DIAGNOSIS — Z85038 Personal history of other malignant neoplasm of large intestine: Secondary | ICD-10-CM | POA: Insufficient documentation

## 2017-07-29 DIAGNOSIS — Z791 Long term (current) use of non-steroidal anti-inflammatories (NSAID): Secondary | ICD-10-CM | POA: Diagnosis not present

## 2017-07-29 DIAGNOSIS — Z86010 Personal history of colonic polyps: Secondary | ICD-10-CM | POA: Diagnosis not present

## 2017-07-29 DIAGNOSIS — J45909 Unspecified asthma, uncomplicated: Secondary | ICD-10-CM | POA: Diagnosis not present

## 2017-07-29 DIAGNOSIS — Z79899 Other long term (current) drug therapy: Secondary | ICD-10-CM | POA: Insufficient documentation

## 2017-07-29 DIAGNOSIS — Z1211 Encounter for screening for malignant neoplasm of colon: Secondary | ICD-10-CM | POA: Insufficient documentation

## 2017-07-29 DIAGNOSIS — Z85828 Personal history of other malignant neoplasm of skin: Secondary | ICD-10-CM | POA: Insufficient documentation

## 2017-07-29 DIAGNOSIS — Z800 Family history of malignant neoplasm of digestive organs: Secondary | ICD-10-CM | POA: Insufficient documentation

## 2017-07-29 DIAGNOSIS — K640 First degree hemorrhoids: Secondary | ICD-10-CM | POA: Diagnosis not present

## 2017-07-29 DIAGNOSIS — K219 Gastro-esophageal reflux disease without esophagitis: Secondary | ICD-10-CM | POA: Diagnosis not present

## 2017-07-29 DIAGNOSIS — Z87891 Personal history of nicotine dependence: Secondary | ICD-10-CM | POA: Diagnosis not present

## 2017-07-29 DIAGNOSIS — K5520 Angiodysplasia of colon without hemorrhage: Secondary | ICD-10-CM | POA: Insufficient documentation

## 2017-07-29 DIAGNOSIS — F419 Anxiety disorder, unspecified: Secondary | ICD-10-CM | POA: Insufficient documentation

## 2017-07-29 DIAGNOSIS — Z7984 Long term (current) use of oral hypoglycemic drugs: Secondary | ICD-10-CM | POA: Insufficient documentation

## 2017-07-29 DIAGNOSIS — I10 Essential (primary) hypertension: Secondary | ICD-10-CM | POA: Diagnosis not present

## 2017-07-29 DIAGNOSIS — K648 Other hemorrhoids: Secondary | ICD-10-CM | POA: Diagnosis not present

## 2017-07-29 HISTORY — PX: COLONOSCOPY WITH PROPOFOL: SHX5780

## 2017-07-29 SURGERY — COLONOSCOPY WITH PROPOFOL
Anesthesia: Monitor Anesthesia Care

## 2017-07-29 MED ORDER — MIDAZOLAM HCL 5 MG/5ML IJ SOLN
INTRAMUSCULAR | Status: DC | PRN
  Administered 2017-07-29: 2 mg via INTRAVENOUS

## 2017-07-29 MED ORDER — LACTATED RINGERS IV SOLN
INTRAVENOUS | Status: DC | PRN
  Administered 2017-07-29: 13:00:00 via INTRAVENOUS

## 2017-07-29 MED ORDER — MIDAZOLAM HCL 2 MG/2ML IJ SOLN
INTRAMUSCULAR | Status: AC
  Filled 2017-07-29: qty 2

## 2017-07-29 MED ORDER — PROPOFOL 10 MG/ML IV BOLUS
INTRAVENOUS | Status: AC
  Filled 2017-07-29: qty 20

## 2017-07-29 MED ORDER — STERILE WATER FOR IRRIGATION IR SOLN
Status: DC | PRN
  Administered 2017-07-29: 100 mL

## 2017-07-29 MED ORDER — PROPOFOL 500 MG/50ML IV EMUL
INTRAVENOUS | Status: DC | PRN
  Administered 2017-07-29: 100 ug/kg/min via INTRAVENOUS
  Administered 2017-07-29: 75 ug/kg/min via INTRAVENOUS

## 2017-07-29 NOTE — Op Note (Signed)
[facility] Patient Name: Kendra Orozco Procedure Date: 07/29/2017 2:10 PM MRN: [id number] Date of Birth: 1945/04/21 Attending MD: Norvel Richards , MD CSN: [id number] Age: 72 Admit Type: Outpatient Procedure:                Colonoscopy Indications:              High risk colon cancer surveillance: Personal                            history of colonic polyps Providers:                Norvel Richards, MD, Janeece Riggers, RN, Lurline Del, RN Referring MD:              Medicines:                Propofol per Anesthesia Complications:            No immediate complications. Estimated Blood Loss:     Estimated blood loss: none. Procedure:                Pre-Anesthesia Assessment:                           - Prior to the procedure, a History and Physical                            was performed, and patient medications and                            allergies were reviewed. The patient's tolerance of                            previous anesthesia was also reviewed. The risks                            and benefits of the procedure and the sedation                            options and risks were discussed with the patient.                            All questions were answered, and informed consent                            was obtained. Prior Anticoagulants: The patient has                            taken no previous anticoagulant or antiplatelet                            agents. ASA Grade Assessment: II - A patient with  mild systemic disease. After reviewing the risks                            and benefits, the patient was deemed in                            satisfactory condition to undergo the procedure.                           After obtaining informed consent, the colonoscope                            was passed under direct vision. Throughout the                            procedure, the patient's blood  pressure, pulse, and                            oxygen saturations were monitored continuously. The                            EC-[id number] ([id number]) scope was introduced through                            the and advanced to the the cecum, identified by                            appendiceal orifice and ileocecal valve. The                            colonoscopy was performed without difficulty. The                            patient tolerated the procedure well. The quality                            of the bowel preparation was adequate. The                            ileocecal valve, appendiceal orifice, and rectum                            were photographed. The colonoscopy was performed                            without difficulty. The entire colon was well                            visualized. Scope In: 2:30:13 PM Scope Out: 2:43:52 PM Scope Withdrawal Time: 0 hours 6 minutes 53 seconds  Total Procedure Duration: 0 hours 13 minutes 39 seconds  Findings:      The perianal and digital rectal examinations were normal.      Non-bleeding internal hemorrhoids were found during retroflexion. The  hemorrhoids were mild, small and Grade I (internal hemorrhoids that do       not prolapse). (2)?"3 millimeter AVMs in the cecum.      The exam was otherwise without abnormality on direct and retroflexion       views. Impression:               - Non-bleeding internal hemorrhoids. Cecal AVMs                           - The examination was otherwise normal on direct                            and retroflexion views.                           - No specimens collected. Moderate Sedation:      Moderate (conscious) sedation was personally administered by an       anesthesia professional. The following parameters were monitored: oxygen       saturation, heart rate, blood pressure, respiratory rate, EKG, adequacy       of pulmonary ventilation, and response to care. Total physician        intraservice time was 18 minutes. Recommendation:           - Written discharge instructions were provided to                            the patient.                           - The signs and symptoms of potential delayed                            complications were discussed with the patient.                           - Patient has a contact number available for                            emergencies.                           - Return to normal activities tomorrow.                           - Advance diet as tolerated. Procedure Code(s):        --- Professional ---                           [id number], Colonoscopy, flexible; diagnostic, including                            collection of specimen(s) by brushing or washing,                            when performed (separate procedure) Diagnosis Code(s):        --- Professional ---  Z86.010, Personal history of colonic polyps                           K64.0, First degree hemorrhoids CPT copyright 2017 American [employer] Association. All rights reserved. The codes documented in this report are preliminary and upon coder review may  be revised to meet current compliance requirements. Cristopher Estimable. , MD Norvel Richards, MD 07/29/2017 2:51:23 PM This report has been signed electronically. Number of Addenda: 0

## 2017-07-29 NOTE — Transfer of Care (Signed)
Immediate Anesthesia Transfer of Care Note  Patient: Kendra Orozco  Procedure(s) Performed: COLONOSCOPY WITH PROPOFOL (N/A )  Patient Location: PACU  Anesthesia Type:MAC  Level of Consciousness: awake and patient cooperative  Airway & Oxygen Therapy: Patient Spontanous Breathing  Post-op Assessment: Report given to RN  Post vital signs: Reviewed and stable  Last Vitals:  Vitals Value Taken Time  BP 103/62 07/29/2017  2:52 PM  Temp    Pulse 68 07/29/2017  2:55 PM  Resp 19 07/29/2017  2:55 PM  SpO2 99 % 07/29/2017  2:55 PM  Vitals shown include unvalidated device data.  Last Pain:  Vitals:   07/29/17 1445  TempSrc:   PainSc: 0-No pain      Patients Stated Pain Goal: 8 (50/09/38 1829)  Complications: No apparent anesthesia complications

## 2017-07-29 NOTE — H&P (Signed)
@LOGO @   Primary Care Physician:  Celene Squibb, MD Primary Gastroenterologist:  Dr. Gala Romney  Pre-Procedure History & Physical: HPI:  Kendra Orozco is a 72 y.o. female here for surveillance colonoscopy. Positive history of colon cancer in the family or personal history of colonic adenoma.  Past Medical History:  Diagnosis Date  . Anxiety   . Arthritis   . Asthma   . Cancer ([place])    skin and colon  . GERD (gastroesophageal reflux disease)   . Gout   . Hypertension     Past Surgical History:  Procedure Laterality Date  . ABDOMINAL HYSTERECTOMY     fibroid tumors  . APPENDECTOMY    . BACK SURGERY    . COLONOSCOPY  09/16/2007   EXB:MWUX canal hemorrhoids, otherwise normal rectum left-sided diverticula and colonic mucosa appeared normal.  . COLONOSCOPY N/A 03/02/2013   Dr. Chauncy Lean adenoma. Colonic diverticulosis. Anal canal hemorrhoids-likely source of hematochezia. Surveillance 2019  . ESOPHAGOGASTRODUODENOSCOPY  09/16/2007   LKG:[id number] undulating Z-line, tiny distal esophageal  erosions consistent with mild erosive reflux esophagitis, patulous EG junction, small hiatal hernia, otherwise normal stomach, D1, and D2.  . HEMORRHOID SURGERY     X 2     Prior to Admission medications   Medication Sig Start Date End Date Taking? Authorizing Provider  albuterol (PROVENTIL HFA;VENTOLIN HFA) 108 (90 Base) MCG/ACT inhaler Inhale 2 puffs into the lungs every 6 (six) hours as needed for wheezing or shortness of breath.   Yes [provider]  AMITIZA 24 MCG capsule TAKE ONE CAPSULE BY MOUTH TWICE DAILY WITH A MEAL. 02/28/17  Yes Carlis Stable, NP  calcium carbonate (OS-CAL) 1250 MG chewable tablet Chew 1 tablet by mouth daily.    Yes [provider]  Cholecalciferol ([employer] VIT D 5000 HIGH-POTENCY) 5000 UNITS capsule Take 5,000 Units by mouth daily.    Yes [provider]  docusate sodium (COLACE) 250 MG capsule Take 250 mg by mouth daily as needed for  constipation.   Yes [provider]  doxycycline (MONODOX) 100 MG capsule Take 100 mg by mouth 2 (two) times daily.  12/28/12  Yes [provider]  EXFORGE HCT 10-320-25 MG TABS Take 1 tablet by mouth daily.  01/05/13  Yes [provider]  ibuprofen (ADVIL,MOTRIN) 200 MG tablet Take 200-400 mg by mouth daily as needed.    Yes [provider]  levocetirizine (XYZAL) 5 MG tablet Take 5 mg by mouth at bedtime.   Yes [provider]  LORazepam (ATIVAN) 0.5 MG tablet Take 0.5 mg by mouth 2 (two) times daily as needed for anxiety.    Yes [provider]  meloxicam (MOBIC) 15 MG tablet Take 15 mg by mouth daily.   Yes [provider]  Menthol-Methyl Salicylate (MUSCLE RUB) 10-15 % CREA Apply 1 application topically as needed for muscle pain.   Yes [provider]  methocarbamol (ROBAXIN) 500 MG tablet Take 500 mg by mouth every 8 (eight) hours as needed for muscle spasms.   Yes [provider]  Multiple Vitamin (MULTIVITAMIN WITH MINERALS) TABS tablet Take 1 tablet by mouth daily.   Yes [provider]  RABEprazole (ACIPHEX) 20 MG tablet TAKE ONE TABLET BY MOUTH DAILY. 02/28/17  Yes Carlis Stable, NP  Tetrahydrozoline HCl (VISINE OP) Place 1 drop into both eyes daily as needed (dry eyes).   Yes [provider]  Wheat Dextrin (BENEFIBER) POWD Take by mouth. 1 packet=1 tbsp once daily  Yes [provider]  pantoprazole (PROTONIX) 40 MG tablet Take 1 tablet (40 mg total) by mouth daily. Patient not taking: Reported on 07/18/2017 09/30/15   Daneil Dolin, MD    Allergies as of 06/28/2017 - Review Complete 06/28/2017  Allergen Reaction Noted  . Latex  02/16/2013  . Sulfa antibiotics Rash 02/11/2013    Family History  Problem Relation Age of Onset  . Colon cancer Brother        33, deceased    Social History   Socioeconomic History  . Marital status: Divorced    Spouse name: Not on file  .  Number of children: Not on file  . Years of education: Not on file  . Highest education level: Not on file  Occupational History  . Not on file  Social Needs  . Financial resource strain: Not on file  . Food insecurity:    Worry: Not on file    Inability: Not on file  . Transportation needs:    Medical: Not on file    Non-medical: Not on file  Tobacco Use  . Smoking status: Former Smoker    Packs/day: 0.25    Years: 3.00    Pack years: 0.75    Types: Cigarettes  . Smokeless tobacco: Never Used  Substance and Sexual Activity  . Alcohol use: Yes    Comment: occ  . Drug use: No  . Sexual activity: Not Currently    Birth control/protection: Surgical  Lifestyle  . Physical activity:    Days per week: Not on file    Minutes per session: Not on file  . Stress: Not on file  Relationships  . Social connections:    Talks on phone: Not on file    Gets together: Not on file    Attends religious service: Not on file    Active member of club or organization: Not on file    Attends meetings of clubs or organizations: Not on file    Relationship status: Not on file  . Intimate partner violence:    Fear of current or ex partner: Not on file    Emotionally abused: Not on file    Physically abused: Not on file    Forced sexual activity: Not on file  Other Topics Concern  . Not on file  Social History Narrative  . Not on file    Review of Systems: See HPI, otherwise negative ROS  Physical Exam: BP 110/64   Pulse 65   Temp 98.1 F (36.7 C) (Oral)   Resp 19   SpO2 97%  General:   Alert,  Well-developed, well-nourished, pleasant and cooperative in NAD Neck:  Supple; no masses or thyromegaly. No significant cervical adenopathy. Lungs:  Clear throughout to auscultation.   No wheezes, crackles, or rhonchi. No acute distress. Heart:  Regular rate and rhythm; no murmurs, clicks, rubs,  or gallops. Abdomen: Non-distended, normal bowel sounds.  Soft and nontender without appreciable  mass or hepatosplenomegaly.  Pulses:  Normal pulses noted. Extremities:  Without clubbing or edema.  Impression:  Pleasant 72 year old lady with a history of colonic adenoma and a positive family history of colon cancer.  Recommendations:  I have offered the patient a surveillance colonoscopy today per plan.  The risks, benefits, limitations, alternatives and imponderables have been reviewed with the patient. Questions have been answered. All parties are agreeable.      Notice: This dictation was prepared with Dragon dictation along with smaller phrase technology. Any transcriptional errors  that result from this process are unintentional and may not be corrected upon review.

## 2017-07-29 NOTE — Anesthesia Postprocedure Evaluation (Signed)
Anesthesia Post Note  Patient: Kendra Orozco  Procedure(s) Performed: COLONOSCOPY WITH PROPOFOL (N/A )  Patient location during evaluation: PACU Anesthesia Type: MAC Level of consciousness: awake and alert Pain management: pain level controlled Vital Signs Assessment: post-procedure vital signs reviewed and stable Respiratory status: spontaneous breathing, nonlabored ventilation, respiratory function stable and patient connected to nasal cannula oxygen Cardiovascular status: stable and blood pressure returned to baseline Postop Assessment: no apparent nausea or vomiting Anesthetic complications: no     Last Vitals:  Vitals:   07/29/17 1138 07/29/17 1453  BP: 110/64 103/62  Pulse: 65 66  Resp: 19 15  Temp: 36.7 C 36.6 C  SpO2: 97% 99%    Last Pain:  Vitals:   07/29/17 1445  TempSrc:   PainSc: 0-No pain                 Benay Pike

## 2017-07-29 NOTE — Anesthesia Procedure Notes (Signed)
Performed by: Benay Pike, MD

## 2017-07-29 NOTE — Anesthesia Preprocedure Evaluation (Signed)
Anesthesia Evaluation  Patient identified by MRN, date of birth, ID band Patient awake    Reviewed: Allergy & Precautions, H&P , NPO status , Patient's Chart, lab work & pertinent test results, reviewed documented beta blocker date and time   Airway Mallampati: III  TM Distance: >3 FB Neck ROM: limited    Dental no notable dental hx.    Pulmonary neg pulmonary ROS, former smoker,    Pulmonary exam normal breath sounds clear to auscultation       Cardiovascular Exercise Tolerance: Good hypertension, negative cardio ROS   Rhythm:regular Rate:Normal     Neuro/Psych negative neurological ROS  negative psych ROS   GI/Hepatic negative GI ROS, Neg liver ROS,   Endo/Other  negative endocrine ROS  Renal/GU negative Renal ROS  negative genitourinary   Musculoskeletal   Abdominal   Peds  Hematology negative hematology ROS (+)   Anesthesia Other Findings No clinical complaints  Reproductive/Obstetrics negative OB ROS                             Anesthesia Physical Anesthesia Plan  ASA: II  Anesthesia Plan: MAC   Post-op Pain Management:    Induction:   PONV Risk Score and Plan:   Airway Management Planned:   Additional Equipment:   Intra-op Plan:   Post-operative Plan:   Informed Consent: I have reviewed the patients History and Physical, chart, labs and discussed the procedure including the risks, benefits and alternatives for the proposed anesthesia with the patient or authorized representative who has indicated his/her understanding and acceptance.   Dental Advisory Given  Plan Discussed with: CRNA  Anesthesia Plan Comments:         Anesthesia Quick Evaluation

## 2017-07-29 NOTE — Discharge Instructions (Signed)

## 2017-08-05 ENCOUNTER — Encounter (HOSPITAL_COMMUNITY): Payer: Self-pay | Admitting: Internal Medicine

## 2017-08-07 ENCOUNTER — Ambulatory Visit
Admission: RE | Admit: 2017-08-07 | Discharge: 2017-08-07 | Disposition: A | Discharge status: Home / Self Care | Payer: PPO | Source: Ambulatory Visit | Attending: Internal Medicine | Admitting: Internal Medicine

## 2017-08-07 DIAGNOSIS — Z1231 Encounter for screening mammogram for malignant neoplasm of breast: Secondary | ICD-10-CM | POA: Diagnosis not present

## 2017-08-07 DIAGNOSIS — Z139 Encounter for screening, unspecified: Secondary | ICD-10-CM

## 2017-08-07 DIAGNOSIS — M8589 Other specified disorders of bone density and structure, multiple sites: Secondary | ICD-10-CM | POA: Diagnosis not present

## 2017-08-07 DIAGNOSIS — E2839 Other primary ovarian failure: Secondary | ICD-10-CM

## 2017-08-07 DIAGNOSIS — Z780 Asymptomatic menopausal state: Secondary | ICD-10-CM | POA: Diagnosis not present

## 2017-08-25 ENCOUNTER — Emergency Department (HOSPITAL_COMMUNITY)
Admission: EM | Admit: 2017-08-25 | Discharge: 2017-08-25 | Disposition: A | Discharge status: Home / Self Care | Payer: PPO | Attending: Emergency Medicine | Admitting: Emergency Medicine

## 2017-08-25 ENCOUNTER — Emergency Department (HOSPITAL_COMMUNITY): Payer: PPO

## 2017-08-25 ENCOUNTER — Encounter (HOSPITAL_COMMUNITY): Payer: Self-pay | Admitting: Emergency Medicine

## 2017-08-25 DIAGNOSIS — Y939 Activity, unspecified: Secondary | ICD-10-CM | POA: Insufficient documentation

## 2017-08-25 DIAGNOSIS — Y929 Unspecified place or not applicable: Secondary | ICD-10-CM | POA: Diagnosis not present

## 2017-08-25 DIAGNOSIS — Z91040 Latex allergy status: Secondary | ICD-10-CM | POA: Diagnosis not present

## 2017-08-25 DIAGNOSIS — S8251XA Displaced fracture of medial malleolus of right tibia, initial encounter for closed fracture: Secondary | ICD-10-CM | POA: Diagnosis not present

## 2017-08-25 DIAGNOSIS — Z87891 Personal history of nicotine dependence: Secondary | ICD-10-CM | POA: Insufficient documentation

## 2017-08-25 DIAGNOSIS — Y999 Unspecified external cause status: Secondary | ICD-10-CM | POA: Diagnosis not present

## 2017-08-25 DIAGNOSIS — Z79899 Other long term (current) drug therapy: Secondary | ICD-10-CM | POA: Diagnosis not present

## 2017-08-25 DIAGNOSIS — Z85038 Personal history of other malignant neoplasm of large intestine: Secondary | ICD-10-CM | POA: Insufficient documentation

## 2017-08-25 DIAGNOSIS — J45909 Unspecified asthma, uncomplicated: Secondary | ICD-10-CM | POA: Diagnosis not present

## 2017-08-25 DIAGNOSIS — S82899A Other fracture of unspecified lower leg, initial encounter for closed fracture: Secondary | ICD-10-CM | POA: Diagnosis not present

## 2017-08-25 DIAGNOSIS — S8254XA Nondisplaced fracture of medial malleolus of right tibia, initial encounter for closed fracture: Secondary | ICD-10-CM | POA: Diagnosis not present

## 2017-08-25 DIAGNOSIS — Z85828 Personal history of other malignant neoplasm of skin: Secondary | ICD-10-CM | POA: Insufficient documentation

## 2017-08-25 DIAGNOSIS — I10 Essential (primary) hypertension: Secondary | ICD-10-CM | POA: Insufficient documentation

## 2017-08-25 DIAGNOSIS — X500XXA Overexertion from strenuous movement or load, initial encounter: Secondary | ICD-10-CM | POA: Diagnosis not present

## 2017-08-25 DIAGNOSIS — T148XXA Other injury of unspecified body region, initial encounter: Secondary | ICD-10-CM | POA: Diagnosis not present

## 2017-08-25 DIAGNOSIS — S8991XA Unspecified injury of right lower leg, initial encounter: Secondary | ICD-10-CM | POA: Diagnosis not present

## 2017-08-25 NOTE — Progress Notes (Signed)
Orthopedic Tech Progress Note Patient Details:  Kendra Orozco Jul 18, 1945 [id number]  Ortho Devices Type of Ortho Device: CAM walker Ortho Device/Splint Location: rle Ortho Device/Splint Interventions: Application   Post Interventions Patient Tolerated: Well Instructions Provided: Care of device   Hildred Priest 08/25/2017, 2:20 PM

## 2017-08-25 NOTE — ED Provider Notes (Signed)
[facility] EMERGENCY DEPARTMENT Provider Note   CSN: [id number] Arrival date & time: 08/25/17  1221     History   Chief Complaint Chief Complaint  Patient presents with  . Ankle Injury    HPI Kendra Orozco is a 72 y.o. female with a past medical history of hypertension, GERD, asthma, who presents to ED for evaluation of right ankle pain, swelling after injury this morning.  She was walking down her 3 set of stairs on her deck on her way to get breakfast when she lost her footing, stumbled and felt her right ankle twist.  She fell to the ground as a result of it.  However, she denies any head injury or loss of consciousness.  Patient has had a history of sciatic nerve issues in the past and reports "burning" sensation down her right lateral leg which is typical for her.  No prior fracture, dislocations or procedures in the area.  Denies any headache, vision changes, loss of bowel or bladder function, changes in chronic back pain.  HPI  Past Medical History:  Diagnosis Date  . Anxiety   . Arthritis   . Asthma   . Cancer ([place])    skin and colon  . GERD (gastroesophageal reflux disease)   . Gout   . Hypertension     Patient Active Problem List   Diagnosis Date Noted  . Constipation 04/15/2013  . Rectal bleeding 02/11/2013  . GERD (gastroesophageal reflux disease) 02/11/2013    Past Surgical History:  Procedure Laterality Date  . ABDOMINAL HYSTERECTOMY     fibroid tumors  . APPENDECTOMY    . BACK SURGERY    . BREAST CYST EXCISION Right   . COLONOSCOPY  09/16/2007   VFI:EPPI canal hemorrhoids, otherwise normal rectum left-sided diverticula and colonic mucosa appeared normal.  . COLONOSCOPY N/A 03/02/2013   Dr. Chauncy Lean adenoma. Colonic diverticulosis. Anal canal hemorrhoids-likely source of hematochezia. Surveillance 2019  . COLONOSCOPY WITH PROPOFOL N/A 07/29/2017   Procedure: COLONOSCOPY WITH PROPOFOL;  Surgeon: Daneil Dolin, MD;  Location:  [facility];  Service: Endoscopy;  Laterality: N/A;  1:00pm  . ESOPHAGOGASTRODUODENOSCOPY  09/16/2007   RJJ:[id number] undulating Z-line, tiny distal esophageal  erosions consistent with mild erosive reflux esophagitis, patulous EG junction, small hiatal hernia, otherwise normal stomach, D1, and D2.  . HEMORRHOID SURGERY     X 2      OB History   None      Home Medications    Prior to Admission medications   Medication Sig Start Date End Date Taking? Authorizing Provider  albuterol (PROVENTIL HFA;VENTOLIN HFA) 108 (90 Base) MCG/ACT inhaler Inhale 2 puffs into the lungs every 6 (six) hours as needed for wheezing or shortness of breath.    [provider]  AMITIZA 24 MCG capsule TAKE ONE CAPSULE BY MOUTH TWICE DAILY WITH A MEAL. 02/28/17   Carlis Stable, NP  calcium carbonate (OS-CAL) 1250 MG chewable tablet Chew 1 tablet by mouth daily.     [provider]  Cholecalciferol ([employer]S VIT D 5000 HIGH-POTENCY) 5000 UNITS capsule Take 5,000 Units by mouth daily.     [provider]  docusate sodium (COLACE) 250 MG capsule Take 250 mg by mouth daily as needed for constipation.    [provider]  doxycycline (MONODOX) 100 MG capsule Take 100 mg by mouth 2 (two) times daily.  12/28/12   [provider]  EXFORGE HCT 10-320-25 MG TABS Take 1 tablet by mouth  daily.  01/05/13   [provider]  ibuprofen (ADVIL,MOTRIN) 200 MG tablet Take 200-400 mg by mouth daily as needed.     [provider]  levocetirizine (XYZAL) 5 MG tablet Take 5 mg by mouth at bedtime.    [provider]  LORazepam (ATIVAN) 0.5 MG tablet Take 0.5 mg by mouth 2 (two) times daily as needed for anxiety.     [provider]  meloxicam (MOBIC) 15 MG tablet Take 15 mg by mouth daily.    [provider]  Menthol-Methyl Salicylate (MUSCLE RUB) 10-15 % CREA Apply 1 application topically as needed for muscle pain.    [provider]    methocarbamol (ROBAXIN) 500 MG tablet Take 500 mg by mouth every 8 (eight) hours as needed for muscle spasms.    [provider]  Multiple Vitamin (MULTIVITAMIN WITH MINERALS) TABS tablet Take 1 tablet by mouth daily.    [provider]  RABEprazole (ACIPHEX) 20 MG tablet TAKE ONE TABLET BY MOUTH DAILY. 02/28/17   Carlis Stable, NP  Tetrahydrozoline HCl (VISINE OP) Place 1 drop into both eyes daily as needed (dry eyes).    [provider]  Wheat Dextrin (BENEFIBER) POWD Take by mouth. 1 packet=1 tbsp once daily    [provider]    Family History Family History  Problem Relation Age of Onset  . Colon cancer Brother        40, deceased  . Breast cancer Sister 68  . Breast cancer Maternal Aunt 31    Social History Social History   Tobacco Use  . Smoking status: Former Smoker    Packs/day: 0.25    Years: 3.00    Pack years: 0.75    Types: Cigarettes  . Smokeless tobacco: Never Used  Substance Use Topics  . Alcohol use: Yes    Comment: occ  . Drug use: No     Allergies   Pantoprazole sodium; Latex; and Sulfa antibiotics   Review of Systems Review of Systems  Constitutional: Negative for appetite change, chills and fever.  HENT: Negative for ear pain, rhinorrhea, sneezing and sore throat.   Eyes: Negative for photophobia and visual disturbance.  Respiratory: Negative for cough, chest tightness, shortness of breath and wheezing.   Cardiovascular: Negative for chest pain and palpitations.  Gastrointestinal: Negative for abdominal pain, blood in stool, constipation, diarrhea, nausea and vomiting.  Genitourinary: Negative for dysuria, hematuria and urgency.  Musculoskeletal: Positive for arthralgias and joint swelling. Negative for myalgias.  Skin: Negative for rash.  Neurological: Negative for dizziness, weakness and light-headedness.     Physical Exam Updated Vital Signs BP 124/74   Pulse 78   Temp 98.2 F (36.8 C) (Oral)   Resp  (!) 21   SpO2 97%   Physical Exam  Constitutional: She appears well-developed and well-nourished. No distress.  HENT:  Head: Normocephalic and atraumatic.  Nose: Nose normal.  Eyes: Conjunctivae and EOM are normal. Left eye exhibits no discharge. No scleral icterus.  Neck: Normal range of motion. Neck supple.  Cardiovascular: Normal rate, regular rhythm, normal heart sounds and intact distal pulses. Exam reveals no gallop and no friction rub.  No murmur heard. Pulmonary/Chest: Effort normal and breath sounds normal. No respiratory distress.  Abdominal: Soft. Bowel sounds are normal. She exhibits no distension. There is no tenderness. There is no guarding.  Musculoskeletal: She exhibits edema, tenderness and deformity.  Tenderness to palpation of the right ankle.  There is significant edema but no  warmth of the joint noted.  Slight deformity noted.  2+ DP pulse noted.  Pain to lateral aspect of right knee.  Able to flex and extend although reports pain.  Able to perform straight leg raise.  Neurological: She is alert. She exhibits normal muscle tone. Coordination normal.  Skin: Skin is warm and dry. No rash noted.  Psychiatric: She has a normal mood and affect.  Nursing note and vitals reviewed.    ED Treatments / Results  Labs (all labs ordered are listed, but only abnormal results are displayed) Labs Reviewed - No data to display  EKG None  Radiology Dg Ankle Complete Right  Result Date: 08/25/2017 CLINICAL DATA:  Was walking down the steps and tripped, hurt ankle popped, obvious deformity, initial encounter EXAM: RIGHT ANKLE - COMPLETE 3+ VIEW COMPARISON:  None FINDINGS: Mild osseous demineralization. Joint spaces preserved. Soft tissue swelling diffusely at ankle. Avulsion fracture at tip of medial malleolus with additional nondisplaced oblique fracture plane through mid medial malleolus. No additional fracture, dislocation or bone destruction. Calcaneal spurring. IMPRESSION:  Medial malleolar fractures RIGHT ankle. Electronically Signed   By: Lavonia Dana M.D.   On: 08/25/2017 13:16   Dg Knee Complete 4 Views Right  Result Date: 08/25/2017 CLINICAL DATA:  Walking down the steps and tripped, heard a pop, obvious RIGHT ankle deformity, initial encounter EXAM: RIGHT KNEE - COMPLETE 4+ VIEW COMPARISON:  None FINDINGS: Bones appear demineralized. Mild joint space narrowing. Chondrocalcinosis at medial compartment question CPPD. No acute fracture, dislocation, or bone destruction. No knee joint effusion. IMPRESSION: Mild degenerative changes and question CPPD RIGHT knee. No acute RIGHT knee abnormalities. Electronically Signed   By: Lavonia Dana M.D.   On: 08/25/2017 13:15    Procedures Procedures (including critical care time)  Medications Ordered in ED Medications - No data to display   Initial Impression / Assessment and Plan / ED Course  I have reviewed the triage vital signs and the nursing notes.  Pertinent labs & imaging results that were available during my care of the patient were reviewed by me and considered in my medical decision making (see chart for details).     Patient presents to ED for evaluation of right ankle pain, swelling and decreased range of motion after injury this morning.  She was walking down 3 sets of stairs on her deck when she lost her footing, stumbled and felt her right ankle twist.  She denies any head injury or loss of consciousness.  On physical exam there is tenderness to palpation, edema and slight deformity of the ankle.  Area is neurovascularly intact.  Patient has a history of sciatic nerve issues and reports "burning" sensation down her right lateral leg which is typical for her.  X-ray of the knee was negative.  X-ray of the ankle showed medial malleolar fractures.  Patient will be placed in a cam walker and advised to follow-up with her orthopedist who she is established with. Advised to return to ED for any severe or worsening  symptoms. Patient d/w and seen by my attending, Dr. Gilford Raid.  Portions of this note were generated with [employer]. Dictation errors may occur despite best attempts at proofreading.   Final Clinical Impressions(s) / ED Diagnoses   Final diagnoses:  Closed nondisplaced fracture of medial malleolus of right tibia, initial encounter    ED Discharge Orders    None       Delia Heady, PA-C 08/25/17 1425    Isla Pence, MD 08/25/17  1430  

## 2017-08-25 NOTE — ED Triage Notes (Signed)
Pt arrives via [facility] with obvious R ankle deformity. Pt was walking down steps and tripped and heard ankle "pop". Pt given 8mg  morphine en route. CMS intact. Pt complains of sob, hx asthma. 123/73, 100% RA, 82 HR.

## 2017-08-28 ENCOUNTER — Ambulatory Visit: Payer: PPO | Admitting: Orthopaedic Surgery

## 2017-08-28 ENCOUNTER — Encounter: Payer: Self-pay | Admitting: Orthopaedic Surgery

## 2017-08-28 VITALS — BP 135/81 | HR 105 | Temp 98.0°F | Ht 63.0 in

## 2017-08-28 DIAGNOSIS — S8254XA Nondisplaced fracture of medial malleolus of right tibia, initial encounter for closed fracture: Secondary | ICD-10-CM | POA: Diagnosis not present

## 2017-08-28 NOTE — Progress Notes (Signed)
Subjective: I broke my right ankle    Patient ID: Kendra Orozco, female    DOB: June 18, 1945, 72 y.o.   MRN: [id number]  HPI She fell and hurt her right ankle on 08-25-17.  She was seen in the ER and X-rays showed a nondisplaced medial malleolus fracture on the right.  She was given a CAM walker and crutches.  She had no other injury but she has some pain of the medial knee on the right.  She has done well with the CAM walker and the crutches.  She is taking Advil and Tylenol for pain.  Review of Systems  Constitutional: Positive for activity change.  Respiratory: Positive for shortness of breath. Negative for cough.   Cardiovascular: Positive for leg swelling. Negative for chest pain.  Musculoskeletal: Positive for arthralgias and gait problem.  Psychiatric/Behavioral: The patient is nervous/anxious.   All other systems reviewed and are negative.  Past Medical History:  Diagnosis Date  . Anxiety   . Arthritis   . Asthma   . Cancer ([place])    skin and colon  . GERD (gastroesophageal reflux disease)   . Gout   . Hypertension     Past Surgical History:  Procedure Laterality Date  . ABDOMINAL HYSTERECTOMY     fibroid tumors  . APPENDECTOMY    . BACK SURGERY    . BREAST CYST EXCISION Right   . COLONOSCOPY  09/16/2007   RKY:HCWC canal hemorrhoids, otherwise normal rectum left-sided diverticula and colonic mucosa appeared normal.  . COLONOSCOPY N/A 03/02/2013   Dr. Chauncy Lean adenoma. Colonic diverticulosis. Anal canal hemorrhoids-likely source of hematochezia. Surveillance 2019  . COLONOSCOPY WITH PROPOFOL N/A 07/29/2017   Procedure: COLONOSCOPY WITH PROPOFOL;  Surgeon: Daneil Dolin, MD;  [facility];  Service: Endoscopy;  Laterality: N/A;  1:00pm  . ESOPHAGOGASTRODUODENOSCOPY  09/16/2007   [id number] undulating Z-line, tiny distal esophageal  erosions consistent with mild erosive reflux esophagitis, patulous EG junction, small hiatal hernia, otherwise  normal stomach, D1, and D2.  . HEMORRHOID SURGERY     X 2     Current Outpatient Medications on File Prior to Visit  Medication Sig Dispense Refill  . albuterol (PROVENTIL HFA;VENTOLIN HFA) 108 (90 Base) MCG/ACT inhaler Inhale 2 puffs into the lungs every 6 (six) hours as needed for wheezing or shortness of breath.    . AMITIZA 24 MCG capsule TAKE ONE CAPSULE BY MOUTH TWICE DAILY WITH A MEAL. 180 capsule 0  . calcium carbonate (OS-CAL) 1250 MG chewable tablet Chew 1 tablet by mouth daily.     . Cholecalciferol (CVS VIT D 5000 HIGH-POTENCY) 5000 UNITS capsule Take 5,000 Units by mouth daily.     Marland Kitchen docusate sodium (COLACE) 250 MG capsule Take 250 mg by mouth daily as needed for constipation.    Marland Kitchen EXFORGE HCT 10-320-25 MG TABS Take 1 tablet by mouth daily.     Marland Kitchen ibuprofen (ADVIL,MOTRIN) 200 MG tablet Take 200-400 mg by mouth daily as needed.     Marland Kitchen levocetirizine (XYZAL) 5 MG tablet Take 5 mg by mouth at bedtime.    . meloxicam (MOBIC) 15 MG tablet Take 15 mg by mouth daily.    . Menthol-Methyl Salicylate (MUSCLE RUB) 10-15 % CREA Apply 1 application topically as needed for muscle pain.    . methocarbamol (ROBAXIN) 500 MG tablet Take 500 mg by mouth every 8 (eight) hours as needed for muscle spasms.    . Multiple Vitamin (MULTIVITAMIN WITH MINERALS) TABS tablet Take 1  tablet by mouth daily.    . RABEprazole (ACIPHEX) 20 MG tablet TAKE ONE TABLET BY MOUTH DAILY. 30 tablet 5  . Wheat Dextrin (BENEFIBER) POWD Take by mouth. 1 packet=1 tbsp once daily    . doxycycline (MONODOX) 100 MG capsule Take 100 mg by mouth 2 (two) times daily.     Marland Kitchen LORazepam (ATIVAN) 0.5 MG tablet Take 0.5 mg by mouth 2 (two) times daily as needed for anxiety.     . Tetrahydrozoline HCl (VISINE OP) Place 1 drop into both eyes daily as needed (dry eyes).     No current facility-administered medications on file prior to visit.     Social History   Socioeconomic History  . Marital status: Divorced    Spouse name: Not on  file  . Number of children: Not on file  . Years of education: Not on file  . Highest education level: Not on file  Occupational History  . Not on file  Social Needs  . Financial resource strain: Not on file  . Food insecurity:    Worry: Not on file    Inability: Not on file  . Transportation needs:    Medical: Not on file    Non-medical: Not on file  Tobacco Use  . Smoking status: Former Smoker    Packs/day: 0.25    Years: 3.00    Pack years: 0.75    Types: Cigarettes  . Smokeless tobacco: Never Used  Substance and Sexual Activity  . Alcohol use: Yes    Comment: occ  . Drug use: No  . Sexual activity: Not Currently    Birth control/protection: Surgical  Lifestyle  . Physical activity:    Days per week: Not on file    Minutes per session: Not on file  . Stress: Not on file  Relationships  . Social connections:    Talks on phone: Not on file    Gets together: Not on file    Attends religious service: Not on file    Active member of club or organization: Not on file    Attends meetings of clubs or organizations: Not on file    Relationship status: Not on file  . Intimate partner violence:    Fear of current or ex partner: Not on file    Emotionally abused: Not on file    Physically abused: Not on file    Forced sexual activity: Not on file  Other Topics Concern  . Not on file  Social History Narrative  . Not on file    Family History  Problem Relation Age of Onset  . Colon cancer Brother        52, deceased  . Breast cancer Sister 63  . Breast cancer Maternal Aunt 28    BP 135/81   Pulse (!) 105   Temp 98 F (36.7 C)   Ht 5\' 3"  (1.6 m)   BMI 32.77 kg/m       Objective:   Physical Exam  Constitutional: She is oriented to person, place, and time. She appears well-developed and well-nourished.  HENT:  Head: Normocephalic and atraumatic.  Eyes: Pupils are equal, round, and reactive to light. Conjunctivae and EOM are normal.  Neck: Normal range of  motion. Neck supple.  Cardiovascular: Normal rate, regular rhythm and intact distal pulses.  Pulmonary/Chest: Effort normal.  Abdominal: Soft.  Musculoskeletal:       Right ankle: She exhibits decreased range of motion, swelling and ecchymosis. Tenderness. Medial malleolus tenderness found.  Feet:  Neurological: She is alert and oriented to person, place, and time. She has normal reflexes. She displays normal reflexes. No cranial nerve deficit. She exhibits normal muscle tone. Coordination normal.  Skin: Skin is warm and dry.  Psychiatric: She has a normal mood and affect. Her behavior is normal. Judgment and thought content normal.          Assessment & Plan:   Encounter Diagnosis  Name Primary?  . Closed nondisplaced fracture of medial malleolus of left tibia, initial encounter Yes   I have told her the fracture is currently nondisplaced but it may displace and need surgery.  Precautions discussed.  Return in one week.  X-rays then out of the CAM walker of the right ankle.  Call if any problem.  Precautions discussed.   Electronically Signed Sanjuana Kava, MD 5/15/20195:19 PM

## 2017-09-04 ENCOUNTER — Encounter: Payer: Self-pay | Admitting: Orthopaedic Surgery

## 2017-09-04 ENCOUNTER — Ambulatory Visit (INDEPENDENT_AMBULATORY_CARE_PROVIDER_SITE_OTHER): Payer: Self-pay | Admitting: Orthopaedic Surgery

## 2017-09-04 ENCOUNTER — Other Ambulatory Visit: Payer: Self-pay | Admitting: Nurse Practitioner

## 2017-09-04 ENCOUNTER — Ambulatory Visit (INDEPENDENT_AMBULATORY_CARE_PROVIDER_SITE_OTHER): Payer: PPO

## 2017-09-04 DIAGNOSIS — S8254XD Nondisplaced fracture of medial malleolus of right tibia, subsequent encounter for closed fracture with routine healing: Secondary | ICD-10-CM

## 2017-09-04 MED ORDER — HYDROCODONE-ACETAMINOPHEN 5-325 MG PO TABS: ORAL_TABLET | ORAL | 0 refills | Status: DC

## 2017-09-04 NOTE — Progress Notes (Signed)
CC:  My ankle is sore  She has been wearing the CAM walker on the right.  She has some pain.  She has no new trauma.  NV intact. ROM is good.  X-rays were done of the right ankle, reported separately.  Encounter Diagnosis  Name Primary?  . Closed nondisplaced fracture of medial malleolus of right tibia with routine healing, subsequent encounter Yes   Return in two weeks.  X-rays then.  I have reviewed the [place] web site prior to prescribing narcotic medicine for this patient.  Electronically Signed Sanjuana Kava, MD 5/22/20192:06 PM

## 2017-09-18 ENCOUNTER — Encounter: Payer: Self-pay | Admitting: Orthopaedic Surgery

## 2017-09-18 ENCOUNTER — Ambulatory Visit (INDEPENDENT_AMBULATORY_CARE_PROVIDER_SITE_OTHER): Payer: PPO | Admitting: Orthopaedic Surgery

## 2017-09-18 ENCOUNTER — Ambulatory Visit (INDEPENDENT_AMBULATORY_CARE_PROVIDER_SITE_OTHER): Payer: PPO

## 2017-09-18 VITALS — BP 109/70 | HR 82 | Ht 63.0 in | Wt 181.0 lb

## 2017-09-18 DIAGNOSIS — S8254XD Nondisplaced fracture of medial malleolus of right tibia, subsequent encounter for closed fracture with routine healing: Secondary | ICD-10-CM

## 2017-09-18 NOTE — Progress Notes (Signed)
CC:  My ankle is better  She is using the CAM walker on the right with no problem.  She has no new trauma  X-rays were done of the right ankle,reported separately.  NV intact. ROM is good of the right ankle.  Encounter Diagnosis  Name Primary?  . Closed nondisplaced fracture of medial malleolus of right tibia with routine healing, subsequent encounter Yes   X-rays on return in three weeks.  Call if any problem.  Precautions discussed.   Electronically Signed Sanjuana Kava, MD 6/5/20192:08 PM

## 2017-09-24 DIAGNOSIS — M9901 Segmental and somatic dysfunction of cervical region: Secondary | ICD-10-CM | POA: Diagnosis not present

## 2017-09-24 DIAGNOSIS — M9903 Segmental and somatic dysfunction of lumbar region: Secondary | ICD-10-CM | POA: Diagnosis not present

## 2017-09-24 DIAGNOSIS — M5413 Radiculopathy, cervicothoracic region: Secondary | ICD-10-CM | POA: Diagnosis not present

## 2017-09-24 DIAGNOSIS — M546 Pain in thoracic spine: Secondary | ICD-10-CM | POA: Diagnosis not present

## 2017-09-24 DIAGNOSIS — M9902 Segmental and somatic dysfunction of thoracic region: Secondary | ICD-10-CM | POA: Diagnosis not present

## 2017-09-24 DIAGNOSIS — M545 Low back pain: Secondary | ICD-10-CM | POA: Diagnosis not present

## 2017-10-07 ENCOUNTER — Other Ambulatory Visit: Payer: Self-pay | Admitting: Nurse Practitioner

## 2017-10-09 ENCOUNTER — Encounter: Payer: Self-pay | Admitting: Orthopaedic Surgery

## 2017-10-09 ENCOUNTER — Ambulatory Visit (INDEPENDENT_AMBULATORY_CARE_PROVIDER_SITE_OTHER): Payer: Self-pay | Admitting: Orthopaedic Surgery

## 2017-10-09 ENCOUNTER — Ambulatory Visit (INDEPENDENT_AMBULATORY_CARE_PROVIDER_SITE_OTHER): Payer: PPO

## 2017-10-09 DIAGNOSIS — I10 Essential (primary) hypertension: Secondary | ICD-10-CM | POA: Diagnosis not present

## 2017-10-09 DIAGNOSIS — S8254XD Nondisplaced fracture of medial malleolus of right tibia, subsequent encounter for closed fracture with routine healing: Secondary | ICD-10-CM

## 2017-10-09 DIAGNOSIS — K219 Gastro-esophageal reflux disease without esophagitis: Secondary | ICD-10-CM | POA: Diagnosis not present

## 2017-10-09 DIAGNOSIS — M25561 Pain in right knee: Secondary | ICD-10-CM | POA: Diagnosis not present

## 2017-10-09 DIAGNOSIS — Z0000 Encounter for general adult medical examination without abnormal findings: Secondary | ICD-10-CM | POA: Diagnosis not present

## 2017-10-09 DIAGNOSIS — Z6829 Body mass index (BMI) 29.0-29.9, adult: Secondary | ICD-10-CM | POA: Diagnosis not present

## 2017-10-09 DIAGNOSIS — E782 Mixed hyperlipidemia: Secondary | ICD-10-CM | POA: Diagnosis not present

## 2017-10-09 DIAGNOSIS — L0201 Cutaneous abscess of face: Secondary | ICD-10-CM | POA: Diagnosis not present

## 2017-10-09 DIAGNOSIS — M1711 Unilateral primary osteoarthritis, right knee: Secondary | ICD-10-CM | POA: Diagnosis not present

## 2017-10-09 DIAGNOSIS — K589 Irritable bowel syndrome without diarrhea: Secondary | ICD-10-CM | POA: Diagnosis not present

## 2017-10-09 DIAGNOSIS — F411 Generalized anxiety disorder: Secondary | ICD-10-CM | POA: Diagnosis not present

## 2017-10-09 DIAGNOSIS — Z6830 Body mass index (BMI) 30.0-30.9, adult: Secondary | ICD-10-CM | POA: Diagnosis not present

## 2017-10-09 DIAGNOSIS — S30861A Insect bite (nonvenomous) of abdominal wall, initial encounter: Secondary | ICD-10-CM | POA: Diagnosis not present

## 2017-10-09 NOTE — Progress Notes (Signed)
CC:  My ankle does not hurt  She is doing well with the right ankle.  She has the CAM walker still.  She has no new trauma.  NV intact. ROM is full.  X-rays were done of the right ankle, reported separately.  Encounter Diagnosis  Name Primary?  . Closed nondisplaced fracture of medial malleolus of right tibia with routine healing, subsequent encounter Yes   She can stop the CAM walker.    Return in two weeks.  Start walking normally.  Call if any problem.  Precautions discussed.   Electronically Signed Sanjuana Kava, MD 6/26/20191:51 PM

## 2017-10-22 DIAGNOSIS — M9901 Segmental and somatic dysfunction of cervical region: Secondary | ICD-10-CM | POA: Diagnosis not present

## 2017-10-22 DIAGNOSIS — M5413 Radiculopathy, cervicothoracic region: Secondary | ICD-10-CM | POA: Diagnosis not present

## 2017-10-22 DIAGNOSIS — M545 Low back pain: Secondary | ICD-10-CM | POA: Diagnosis not present

## 2017-10-22 DIAGNOSIS — M9902 Segmental and somatic dysfunction of thoracic region: Secondary | ICD-10-CM | POA: Diagnosis not present

## 2017-10-22 DIAGNOSIS — M9903 Segmental and somatic dysfunction of lumbar region: Secondary | ICD-10-CM | POA: Diagnosis not present

## 2017-10-22 DIAGNOSIS — M546 Pain in thoracic spine: Secondary | ICD-10-CM | POA: Diagnosis not present

## 2017-10-23 ENCOUNTER — Ambulatory Visit (INDEPENDENT_AMBULATORY_CARE_PROVIDER_SITE_OTHER): Payer: PPO | Admitting: Orthopaedic Surgery

## 2017-10-23 ENCOUNTER — Encounter: Payer: Self-pay | Admitting: Orthopaedic Surgery

## 2017-10-23 DIAGNOSIS — S8254XD Nondisplaced fracture of medial malleolus of right tibia, subsequent encounter for closed fracture with routine healing: Secondary | ICD-10-CM

## 2017-10-23 NOTE — Progress Notes (Signed)
CC:  My ankle is better  She is wearing regular shoes now.  She has just minimal discomfort to the right ankle.  NV intact.  Right ankle has full motion.  Encounter Diagnosis  Name Primary?  . Closed nondisplaced fracture of medial malleolus of right tibia with routine healing, subsequent encounter Yes   I will see her in one month.  X-rays then of the right ankle.  Call if any problem.  Precautions discussed.   Electronically Signed Sanjuana Kava, MD 7/10/20192:23 PM

## 2017-11-21 ENCOUNTER — Ambulatory Visit (INDEPENDENT_AMBULATORY_CARE_PROVIDER_SITE_OTHER): Payer: PPO

## 2017-11-21 ENCOUNTER — Ambulatory Visit: Payer: PPO | Admitting: Orthopaedic Surgery

## 2017-11-21 ENCOUNTER — Encounter: Payer: Self-pay | Admitting: Orthopaedic Surgery

## 2017-11-21 DIAGNOSIS — S8254XD Nondisplaced fracture of medial malleolus of right tibia, subsequent encounter for closed fracture with routine healing: Secondary | ICD-10-CM

## 2017-11-21 NOTE — Progress Notes (Signed)
CC:  My ankle is OK  She is doing well with the right ankle. She has no pain.  NV intact. She has full ROM of the right ankle.  X-rays were done, reported separately.  Encounter Diagnosis  Name Primary?  . Closed nondisplaced fracture of medial malleolus of right tibia with routine healing, subsequent encounter Yes   I will see her as needed.  Call if any problem.  Precautions discussed.   Electronically Signed Sanjuana Kava, MD 8/8/20199:50 AM

## 2017-12-12 DIAGNOSIS — M546 Pain in thoracic spine: Secondary | ICD-10-CM | POA: Diagnosis not present

## 2017-12-12 DIAGNOSIS — M545 Low back pain: Secondary | ICD-10-CM | POA: Diagnosis not present

## 2017-12-12 DIAGNOSIS — M9901 Segmental and somatic dysfunction of cervical region: Secondary | ICD-10-CM | POA: Diagnosis not present

## 2017-12-12 DIAGNOSIS — M5413 Radiculopathy, cervicothoracic region: Secondary | ICD-10-CM | POA: Diagnosis not present

## 2017-12-12 DIAGNOSIS — M9903 Segmental and somatic dysfunction of lumbar region: Secondary | ICD-10-CM | POA: Diagnosis not present

## 2017-12-12 DIAGNOSIS — M9902 Segmental and somatic dysfunction of thoracic region: Secondary | ICD-10-CM | POA: Diagnosis not present

## 2017-12-17 DIAGNOSIS — M25511 Pain in right shoulder: Secondary | ICD-10-CM | POA: Diagnosis not present

## 2017-12-17 DIAGNOSIS — Z6830 Body mass index (BMI) 30.0-30.9, adult: Secondary | ICD-10-CM | POA: Diagnosis not present

## 2017-12-31 DIAGNOSIS — F411 Generalized anxiety disorder: Secondary | ICD-10-CM | POA: Diagnosis not present

## 2017-12-31 DIAGNOSIS — M1711 Unilateral primary osteoarthritis, right knee: Secondary | ICD-10-CM | POA: Diagnosis not present

## 2017-12-31 DIAGNOSIS — I10 Essential (primary) hypertension: Secondary | ICD-10-CM | POA: Diagnosis not present

## 2017-12-31 DIAGNOSIS — J45909 Unspecified asthma, uncomplicated: Secondary | ICD-10-CM | POA: Diagnosis not present

## 2017-12-31 DIAGNOSIS — K589 Irritable bowel syndrome without diarrhea: Secondary | ICD-10-CM | POA: Diagnosis not present

## 2017-12-31 DIAGNOSIS — K219 Gastro-esophageal reflux disease without esophagitis: Secondary | ICD-10-CM | POA: Diagnosis not present

## 2017-12-31 DIAGNOSIS — E782 Mixed hyperlipidemia: Secondary | ICD-10-CM | POA: Diagnosis not present

## 2018-01-06 DIAGNOSIS — I10 Essential (primary) hypertension: Secondary | ICD-10-CM | POA: Diagnosis not present

## 2018-01-06 DIAGNOSIS — E782 Mixed hyperlipidemia: Secondary | ICD-10-CM | POA: Diagnosis not present

## 2018-01-08 DIAGNOSIS — K219 Gastro-esophageal reflux disease without esophagitis: Secondary | ICD-10-CM | POA: Diagnosis not present

## 2018-01-08 DIAGNOSIS — Z6831 Body mass index (BMI) 31.0-31.9, adult: Secondary | ICD-10-CM | POA: Diagnosis not present

## 2018-01-08 DIAGNOSIS — F419 Anxiety disorder, unspecified: Secondary | ICD-10-CM | POA: Diagnosis not present

## 2018-01-08 DIAGNOSIS — K589 Irritable bowel syndrome without diarrhea: Secondary | ICD-10-CM | POA: Diagnosis not present

## 2018-01-08 DIAGNOSIS — E782 Mixed hyperlipidemia: Secondary | ICD-10-CM | POA: Diagnosis not present

## 2018-01-08 DIAGNOSIS — I10 Essential (primary) hypertension: Secondary | ICD-10-CM | POA: Diagnosis not present

## 2018-01-09 DIAGNOSIS — M9901 Segmental and somatic dysfunction of cervical region: Secondary | ICD-10-CM | POA: Diagnosis not present

## 2018-01-09 DIAGNOSIS — M546 Pain in thoracic spine: Secondary | ICD-10-CM | POA: Diagnosis not present

## 2018-01-09 DIAGNOSIS — M9902 Segmental and somatic dysfunction of thoracic region: Secondary | ICD-10-CM | POA: Diagnosis not present

## 2018-01-09 DIAGNOSIS — M5413 Radiculopathy, cervicothoracic region: Secondary | ICD-10-CM | POA: Diagnosis not present

## 2018-01-09 DIAGNOSIS — M9903 Segmental and somatic dysfunction of lumbar region: Secondary | ICD-10-CM | POA: Diagnosis not present

## 2018-01-09 DIAGNOSIS — M545 Low back pain: Secondary | ICD-10-CM | POA: Diagnosis not present

## 2018-01-23 ENCOUNTER — Encounter: Payer: Self-pay | Admitting: Internal Medicine

## 2018-01-28 DIAGNOSIS — G5600 Carpal tunnel syndrome, unspecified upper limb: Secondary | ICD-10-CM | POA: Diagnosis not present

## 2018-01-28 DIAGNOSIS — K219 Gastro-esophageal reflux disease without esophagitis: Secondary | ICD-10-CM | POA: Diagnosis not present

## 2018-01-28 DIAGNOSIS — E782 Mixed hyperlipidemia: Secondary | ICD-10-CM | POA: Diagnosis not present

## 2018-01-28 DIAGNOSIS — J45909 Unspecified asthma, uncomplicated: Secondary | ICD-10-CM | POA: Diagnosis not present

## 2018-01-28 DIAGNOSIS — I10 Essential (primary) hypertension: Secondary | ICD-10-CM | POA: Diagnosis not present

## 2018-01-28 DIAGNOSIS — F411 Generalized anxiety disorder: Secondary | ICD-10-CM | POA: Diagnosis not present

## 2018-01-28 DIAGNOSIS — M1711 Unilateral primary osteoarthritis, right knee: Secondary | ICD-10-CM | POA: Diagnosis not present

## 2018-01-28 DIAGNOSIS — F5101 Primary insomnia: Secondary | ICD-10-CM | POA: Diagnosis not present

## 2018-01-28 DIAGNOSIS — Z23 Encounter for immunization: Secondary | ICD-10-CM | POA: Diagnosis not present

## 2018-01-31 ENCOUNTER — Telehealth: Payer: Self-pay | Admitting: Internal Medicine

## 2018-01-31 NOTE — Telephone Encounter (Signed)
Dr. Juel Burrow office called and said for [place] to fax her prescription to their office,  They will be doing her refills, also has a question about her Kendra Orozco  [phone] phone      Fax [phone]

## 2018-01-31 NOTE — Telephone Encounter (Signed)
Spoke with the office they need a script for Amitizia and Rabeprazole faxed to their office. They are going to do refills for the pt but stated they needed a script from our office since we prescribed Rx for pt.

## 2018-02-03 MED ORDER — LUBIPROSTONE 24 MCG PO CAPS: ORAL_CAPSULE | ORAL | 5 refills | Status: DC

## 2018-02-03 MED ORDER — RABEPRAZOLE SODIUM 20 MG PO TBEC: 20.0000 mg | DELAYED_RELEASE_TABLET | Freq: Every day | ORAL | 3 refills | Status: DC

## 2018-02-03 NOTE — Telephone Encounter (Signed)
Rx faxed to Dr. Juel Burrow office.

## 2018-02-03 NOTE — Addendum Note (Signed)
Addended by: Annitta Needs on: 02/03/2018 03:31 PM   Modules accepted: Orders

## 2018-02-03 NOTE — Telephone Encounter (Signed)
I printed. Just need to be faxed to Dr. Nevada Crane.

## 2018-03-05 ENCOUNTER — Telehealth: Payer: Self-pay | Admitting: Internal Medicine

## 2018-03-05 NOTE — Telephone Encounter (Signed)
PATIENT PHARMACY NEEDS TO BE CHANGED TO [facility]   PHONE [phone]

## 2018-03-05 NOTE — Telephone Encounter (Signed)
Noted.   Pharmacy added.

## 2018-03-06 DIAGNOSIS — M546 Pain in thoracic spine: Secondary | ICD-10-CM | POA: Diagnosis not present

## 2018-03-06 DIAGNOSIS — M9901 Segmental and somatic dysfunction of cervical region: Secondary | ICD-10-CM | POA: Diagnosis not present

## 2018-03-06 DIAGNOSIS — M5413 Radiculopathy, cervicothoracic region: Secondary | ICD-10-CM | POA: Diagnosis not present

## 2018-03-06 DIAGNOSIS — M9903 Segmental and somatic dysfunction of lumbar region: Secondary | ICD-10-CM | POA: Diagnosis not present

## 2018-03-06 DIAGNOSIS — M545 Low back pain: Secondary | ICD-10-CM | POA: Diagnosis not present

## 2018-03-06 DIAGNOSIS — M9902 Segmental and somatic dysfunction of thoracic region: Secondary | ICD-10-CM | POA: Diagnosis not present

## 2018-03-21 DIAGNOSIS — I10 Essential (primary) hypertension: Secondary | ICD-10-CM | POA: Diagnosis not present

## 2018-03-21 DIAGNOSIS — E782 Mixed hyperlipidemia: Secondary | ICD-10-CM | POA: Diagnosis not present

## 2018-05-14 DIAGNOSIS — I10 Essential (primary) hypertension: Secondary | ICD-10-CM | POA: Diagnosis not present

## 2018-05-14 DIAGNOSIS — E782 Mixed hyperlipidemia: Secondary | ICD-10-CM | POA: Diagnosis not present

## 2018-05-14 DIAGNOSIS — F411 Generalized anxiety disorder: Secondary | ICD-10-CM | POA: Diagnosis not present

## 2018-05-14 DIAGNOSIS — K219 Gastro-esophageal reflux disease without esophagitis: Secondary | ICD-10-CM | POA: Diagnosis not present

## 2018-05-20 ENCOUNTER — Encounter: Payer: Self-pay | Admitting: Internal Medicine

## 2018-05-20 ENCOUNTER — Ambulatory Visit: Payer: PPO | Admitting: Internal Medicine

## 2018-05-20 ENCOUNTER — Telehealth: Payer: Self-pay | Admitting: Internal Medicine

## 2018-05-20 DIAGNOSIS — I10 Essential (primary) hypertension: Secondary | ICD-10-CM | POA: Diagnosis not present

## 2018-05-20 DIAGNOSIS — K219 Gastro-esophageal reflux disease without esophagitis: Secondary | ICD-10-CM | POA: Diagnosis not present

## 2018-05-20 DIAGNOSIS — F411 Generalized anxiety disorder: Secondary | ICD-10-CM | POA: Diagnosis not present

## 2018-05-20 DIAGNOSIS — E782 Mixed hyperlipidemia: Secondary | ICD-10-CM | POA: Diagnosis not present

## 2018-05-20 NOTE — Telephone Encounter (Signed)
PATIENT WAS A NO SHOW AND LETTER SENT  °

## 2018-06-05 DIAGNOSIS — M5413 Radiculopathy, cervicothoracic region: Secondary | ICD-10-CM | POA: Diagnosis not present

## 2018-06-05 DIAGNOSIS — M9901 Segmental and somatic dysfunction of cervical region: Secondary | ICD-10-CM | POA: Diagnosis not present

## 2018-06-05 DIAGNOSIS — M9902 Segmental and somatic dysfunction of thoracic region: Secondary | ICD-10-CM | POA: Diagnosis not present

## 2018-06-05 DIAGNOSIS — M9903 Segmental and somatic dysfunction of lumbar region: Secondary | ICD-10-CM | POA: Diagnosis not present

## 2018-06-05 DIAGNOSIS — M545 Low back pain: Secondary | ICD-10-CM | POA: Diagnosis not present

## 2018-06-05 DIAGNOSIS — M546 Pain in thoracic spine: Secondary | ICD-10-CM | POA: Diagnosis not present

## 2018-06-17 ENCOUNTER — Encounter: Payer: Self-pay | Admitting: Internal Medicine

## 2018-06-17 ENCOUNTER — Ambulatory Visit (INDEPENDENT_AMBULATORY_CARE_PROVIDER_SITE_OTHER): Payer: PPO | Admitting: Internal Medicine

## 2018-06-17 VITALS — BP 145/87 | HR 75 | Temp 97.0°F | Ht 63.0 in | Wt 201.2 lb

## 2018-06-17 DIAGNOSIS — K5909 Other constipation: Secondary | ICD-10-CM

## 2018-06-17 DIAGNOSIS — K219 Gastro-esophageal reflux disease without esophagitis: Secondary | ICD-10-CM | POA: Diagnosis not present

## 2018-06-17 NOTE — Progress Notes (Signed)
Primary Care Physician:  Celene Squibb, MD Primary Gastroenterologist:  Dr. Gala Romney  Pre-Procedure History & Physical: HPI:  Kendra Orozco is a 73 y.o. female here for follow-up of constipation and GERD.  Has done well on rabeprazole 20 mg daily.  If she overeats she does have a flare in symptoms.  No dysphagia.  Hemorrhoids bother her on occasion.  Continues on Amitiza twice daily to facilitate bowel function.  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  We discussed possibility 1 more colonoscopy in 5 years but will see how she is when that time arrives.  Takes Benefiber only sporadically.  Past Medical History:  Diagnosis Date  . Anxiety   . Arthritis   . Asthma   . Cancer ([place])    skin and colon  . GERD (gastroesophageal reflux disease)   . Gout   . Hypertension     Past Surgical History:  Procedure Laterality Date  . ABDOMINAL HYSTERECTOMY     fibroid tumors  . APPENDECTOMY    . BACK SURGERY    . BREAST CYST EXCISION Right   . COLONOSCOPY  09/16/2007   WYO:VZCH canal hemorrhoids, otherwise normal rectum left-sided diverticula and colonic mucosa appeared normal.  . COLONOSCOPY N/A 03/02/2013   Dr. Chauncy Lean adenoma. Colonic diverticulosis. Anal canal hemorrhoids-likely source of hematochezia. Surveillance 2019  . COLONOSCOPY WITH PROPOFOL N/A 07/29/2017   Procedure: COLONOSCOPY WITH PROPOFOL;  Surgeon: Daneil Dolin, MD;  [facility];  Service: Endoscopy;  Laterality: N/A;  1:00pm  . ESOPHAGOGASTRODUODENOSCOPY  09/16/2007   [id number] undulating Z-line, tiny distal esophageal  erosions consistent with mild erosive reflux esophagitis, patulous EG junction, small hiatal hernia, otherwise normal stomach, D1, and D2.  . HEMORRHOID SURGERY     X 2     Prior to Admission medications   Medication Sig Start Date End Date Taking? Authorizing Provider  albuterol (PROVENTIL HFA;VENTOLIN HFA) 108 (90 Base) MCG/ACT inhaler Inhale 2  puffs into the lungs every 6 (six) hours as needed for wheezing or shortness of breath.   Yes [provider]  calcium carbonate (OS-CAL) 1250 MG chewable tablet Chew 1 tablet by mouth daily.    Yes [provider]  Cholecalciferol (CVS VIT D 5000 HIGH-POTENCY) 5000 UNITS capsule Take 5,000 Units by mouth daily.    Yes [provider]  docusate sodium (COLACE) 250 MG capsule Take 250 mg by mouth daily as needed for constipation.   Yes [provider]  hydrochlorothiazide (HYDRODIURIL) 25 MG tablet Take 25 mg by mouth daily.   Yes [provider]  ibuprofen (ADVIL,MOTRIN) 200 MG tablet Take 200-400 mg by mouth daily as needed.    Yes [provider]  levocetirizine (XYZAL) 5 MG tablet Take 5 mg by mouth at bedtime.   Yes [provider]  LORazepam (ATIVAN) 0.5 MG tablet Take 0.5 mg by mouth 2 (two) times daily as needed for anxiety.    Yes [provider]  lubiprostone (AMITIZA) 24 MCG capsule TAKE ONE CAPSULE BY MOUTH TWICE DAILY WITH A MEAL. 02/03/18  Yes Annitta Needs, NP  meloxicam (MOBIC) 15 MG tablet Take 15 mg by mouth daily.   Yes [provider]  Menthol-Methyl Salicylate (MUSCLE RUB) 10-15 % CREA Apply 1 application topically as needed for muscle pain.   Yes [provider]  methocarbamol (ROBAXIN) 500 MG tablet Take 500 mg by mouth every 8 (eight) hours as needed for muscle spasms.   Yes  [provider]  Multiple Vitamin (MULTIVITAMIN WITH MINERALS) TABS tablet Take 1 tablet by mouth daily.   Yes [provider]  RABEprazole (ACIPHEX) 20 MG tablet Take 1 tablet (20 mg total) by mouth daily. 30 minutes before breakfast 02/03/18  Yes Annitta Needs, NP  Tetrahydrozoline HCl (VISINE OP) Place 1 drop into both eyes daily as needed (dry eyes).   Yes [provider]  valsartan (DIOVAN) 320 MG tablet Take 320 mg by mouth daily.   Yes [provider]  Wheat Dextrin  (BENEFIBER) POWD Take by mouth. 1 packet=1 tbsp once daily   Yes [provider]  EXFORGE HCT 10-320-25 MG TABS Take 1 tablet by mouth daily.  01/05/13   [provider]    Allergies as of 06/17/2018 - Review Complete 06/17/2018  Allergen Reaction Noted  . Pantoprazole sodium  07/18/2017  . Latex Rash 02/16/2013  . Sulfa antibiotics Swelling 02/11/2013    Family History  Problem Relation Age of Onset  . Colon cancer Brother        14, deceased  . Breast cancer Sister 35  . Breast cancer Maternal Aunt 38    Social History   Socioeconomic History  . Marital status: Divorced    Spouse name: Not on file  . Number of children: Not on file  . Years of education: Not on file  . Highest education level: Not on file  Occupational History  . Not on file  Social Needs  . Financial resource strain: Not on file  . Food insecurity:    Worry: Not on file    Inability: Not on file  . Transportation needs:    Medical: Not on file    Non-medical: Not on file  Tobacco Use  . Smoking status: Former Smoker    Packs/day: 0.25    Years: 3.00    Pack years: 0.75    Types: Cigarettes  . Smokeless tobacco: Never Used  Substance and Sexual Activity  . Alcohol use: Yes    Comment: occ  . Drug use: No  . Sexual activity: Not Currently    Birth control/protection: Surgical  Lifestyle  . Physical activity:    Days per week: Not on file    Minutes per session: Not on file  . Stress: Not on file  Relationships  . Social connections:    Talks on phone: Not on file    Gets together: Not on file    Attends religious service: Not on file    Active member of club or organization: Not on file    Attends meetings of clubs or organizations: Not on file    Relationship status: Not on file  . Intimate partner violence:    Fear of current or ex partner: Not on file    Emotionally abused: Not on file    Physically abused: Not on file    Forced sexual activity: Not on file    Other Topics Concern  . Not on file  Social History Narrative  . Not on file    Review of Systems: See HPI, otherwise negative ROS  Physical Exam: BP (!) 145/87   Pulse 75   Temp (!) 97 F (36.1 C) (Oral)   Ht 5\' 3"  (1.6 m)   Wt 201 lb 3.2 oz (91.3 kg)   BMI 35.64 kg/m  General:   Alert,  Well-developed, well-nourished, pleasant and cooperative in NAD  Impression/Plan: 73 year old lady with history of GERD and constipation doing very well  on rabeprazole 20 mg daily as well as Amitiza 24 BID; needs to do better with daily fiber supplementation.  Hemorrhoids not a major issue at this time although we have discussed banding previously and again today.  Recommendations:  Continue Rabeprazole 20 mg daily  GERD and constipation information provided  Continue Amitiza twice daily  Benefiber 2 tablespoons daily - take at one time  Limit toilet time to 5 minutes  Consider one more colonoscopy in 4 years if overall health permits  Office visit in 1 year       Notice: This dictation was prepared with Dragon dictation along with smaller phrase technology. Any transcriptional errors that result from this process are unintentional and may not be corrected upon review.

## 2018-06-17 NOTE — Patient Instructions (Signed)
Continue Rabeprazole 20 mg daily  GERD and constipation information provided  Continue Amitiza twice daily  Benefiber 2 tablespoons daily  Limit toilet time to 5 minutes  Consider one more colonoscopy in 4 years if overall health permits  Office visit in 1 year

## 2018-07-02 DIAGNOSIS — K219 Gastro-esophageal reflux disease without esophagitis: Secondary | ICD-10-CM | POA: Diagnosis not present

## 2018-07-02 DIAGNOSIS — E782 Mixed hyperlipidemia: Secondary | ICD-10-CM | POA: Diagnosis not present

## 2018-07-02 DIAGNOSIS — I10 Essential (primary) hypertension: Secondary | ICD-10-CM | POA: Diagnosis not present

## 2018-07-02 DIAGNOSIS — F411 Generalized anxiety disorder: Secondary | ICD-10-CM | POA: Diagnosis not present

## 2018-07-02 DIAGNOSIS — J45909 Unspecified asthma, uncomplicated: Secondary | ICD-10-CM | POA: Diagnosis not present

## 2018-07-02 DIAGNOSIS — Z6831 Body mass index (BMI) 31.0-31.9, adult: Secondary | ICD-10-CM | POA: Diagnosis not present

## 2018-07-02 DIAGNOSIS — K589 Irritable bowel syndrome without diarrhea: Secondary | ICD-10-CM | POA: Diagnosis not present

## 2018-07-09 DIAGNOSIS — Z85828 Personal history of other malignant neoplasm of skin: Secondary | ICD-10-CM | POA: Diagnosis not present

## 2018-07-09 DIAGNOSIS — L570 Actinic keratosis: Secondary | ICD-10-CM | POA: Diagnosis not present

## 2018-07-09 DIAGNOSIS — L821 Other seborrheic keratosis: Secondary | ICD-10-CM | POA: Diagnosis not present

## 2018-07-11 DIAGNOSIS — I10 Essential (primary) hypertension: Secondary | ICD-10-CM | POA: Diagnosis not present

## 2018-07-11 DIAGNOSIS — M545 Low back pain: Secondary | ICD-10-CM | POA: Diagnosis not present

## 2018-07-11 DIAGNOSIS — E782 Mixed hyperlipidemia: Secondary | ICD-10-CM | POA: Diagnosis not present

## 2018-07-11 DIAGNOSIS — K588 Other irritable bowel syndrome: Secondary | ICD-10-CM | POA: Diagnosis not present

## 2018-07-11 DIAGNOSIS — R351 Nocturia: Secondary | ICD-10-CM | POA: Diagnosis not present

## 2018-07-11 DIAGNOSIS — F411 Generalized anxiety disorder: Secondary | ICD-10-CM | POA: Diagnosis not present

## 2018-07-11 DIAGNOSIS — K219 Gastro-esophageal reflux disease without esophagitis: Secondary | ICD-10-CM | POA: Diagnosis not present

## 2018-07-24 DIAGNOSIS — Z0000 Encounter for general adult medical examination without abnormal findings: Secondary | ICD-10-CM | POA: Diagnosis not present

## 2018-07-24 DIAGNOSIS — M4712 Other spondylosis with myelopathy, cervical region: Secondary | ICD-10-CM | POA: Diagnosis not present

## 2018-07-24 DIAGNOSIS — I10 Essential (primary) hypertension: Secondary | ICD-10-CM | POA: Diagnosis not present

## 2018-07-24 DIAGNOSIS — M545 Low back pain: Secondary | ICD-10-CM | POA: Diagnosis not present

## 2018-07-24 DIAGNOSIS — M5136 Other intervertebral disc degeneration, lumbar region: Secondary | ICD-10-CM | POA: Diagnosis not present

## 2018-08-05 DIAGNOSIS — K589 Irritable bowel syndrome without diarrhea: Secondary | ICD-10-CM | POA: Diagnosis not present

## 2018-08-05 DIAGNOSIS — F411 Generalized anxiety disorder: Secondary | ICD-10-CM | POA: Diagnosis not present

## 2018-08-05 DIAGNOSIS — J45909 Unspecified asthma, uncomplicated: Secondary | ICD-10-CM | POA: Diagnosis not present

## 2018-08-05 DIAGNOSIS — I10 Essential (primary) hypertension: Secondary | ICD-10-CM | POA: Diagnosis not present

## 2018-08-05 DIAGNOSIS — K219 Gastro-esophageal reflux disease without esophagitis: Secondary | ICD-10-CM | POA: Diagnosis not present

## 2018-08-05 DIAGNOSIS — E782 Mixed hyperlipidemia: Secondary | ICD-10-CM | POA: Diagnosis not present

## 2018-09-04 DIAGNOSIS — K219 Gastro-esophageal reflux disease without esophagitis: Secondary | ICD-10-CM | POA: Diagnosis not present

## 2018-09-04 DIAGNOSIS — E782 Mixed hyperlipidemia: Secondary | ICD-10-CM | POA: Diagnosis not present

## 2018-09-04 DIAGNOSIS — J45909 Unspecified asthma, uncomplicated: Secondary | ICD-10-CM | POA: Diagnosis not present

## 2018-09-04 DIAGNOSIS — I10 Essential (primary) hypertension: Secondary | ICD-10-CM | POA: Diagnosis not present

## 2018-09-19 DIAGNOSIS — K589 Irritable bowel syndrome without diarrhea: Secondary | ICD-10-CM | POA: Diagnosis not present

## 2018-09-19 DIAGNOSIS — E782 Mixed hyperlipidemia: Secondary | ICD-10-CM | POA: Diagnosis not present

## 2018-09-19 DIAGNOSIS — I10 Essential (primary) hypertension: Secondary | ICD-10-CM | POA: Diagnosis not present

## 2018-09-19 DIAGNOSIS — K219 Gastro-esophageal reflux disease without esophagitis: Secondary | ICD-10-CM | POA: Diagnosis not present

## 2018-09-19 DIAGNOSIS — F411 Generalized anxiety disorder: Secondary | ICD-10-CM | POA: Diagnosis not present

## 2018-09-19 DIAGNOSIS — J45909 Unspecified asthma, uncomplicated: Secondary | ICD-10-CM | POA: Diagnosis not present

## 2018-10-01 DIAGNOSIS — R062 Wheezing: Secondary | ICD-10-CM | POA: Diagnosis not present

## 2018-10-01 DIAGNOSIS — J209 Acute bronchitis, unspecified: Secondary | ICD-10-CM | POA: Diagnosis not present

## 2018-10-01 DIAGNOSIS — R05 Cough: Secondary | ICD-10-CM | POA: Diagnosis not present

## 2018-10-27 DIAGNOSIS — M545 Low back pain: Secondary | ICD-10-CM | POA: Diagnosis not present

## 2018-10-27 DIAGNOSIS — M542 Cervicalgia: Secondary | ICD-10-CM | POA: Diagnosis not present

## 2018-10-27 DIAGNOSIS — M9903 Segmental and somatic dysfunction of lumbar region: Secondary | ICD-10-CM | POA: Diagnosis not present

## 2018-10-27 DIAGNOSIS — M9902 Segmental and somatic dysfunction of thoracic region: Secondary | ICD-10-CM | POA: Diagnosis not present

## 2018-10-27 DIAGNOSIS — M546 Pain in thoracic spine: Secondary | ICD-10-CM | POA: Diagnosis not present

## 2018-10-27 DIAGNOSIS — M9901 Segmental and somatic dysfunction of cervical region: Secondary | ICD-10-CM | POA: Diagnosis not present

## 2018-10-30 DIAGNOSIS — E782 Mixed hyperlipidemia: Secondary | ICD-10-CM | POA: Diagnosis not present

## 2018-10-30 DIAGNOSIS — F411 Generalized anxiety disorder: Secondary | ICD-10-CM | POA: Diagnosis not present

## 2018-10-30 DIAGNOSIS — K219 Gastro-esophageal reflux disease without esophagitis: Secondary | ICD-10-CM | POA: Diagnosis not present

## 2018-10-30 DIAGNOSIS — J45909 Unspecified asthma, uncomplicated: Secondary | ICD-10-CM | POA: Diagnosis not present

## 2018-10-30 DIAGNOSIS — I10 Essential (primary) hypertension: Secondary | ICD-10-CM | POA: Diagnosis not present

## 2018-10-30 DIAGNOSIS — K589 Irritable bowel syndrome without diarrhea: Secondary | ICD-10-CM | POA: Diagnosis not present

## 2018-11-03 DIAGNOSIS — M542 Cervicalgia: Secondary | ICD-10-CM | POA: Diagnosis not present

## 2018-11-03 DIAGNOSIS — M9903 Segmental and somatic dysfunction of lumbar region: Secondary | ICD-10-CM | POA: Diagnosis not present

## 2018-11-03 DIAGNOSIS — M9901 Segmental and somatic dysfunction of cervical region: Secondary | ICD-10-CM | POA: Diagnosis not present

## 2018-11-03 DIAGNOSIS — M9902 Segmental and somatic dysfunction of thoracic region: Secondary | ICD-10-CM | POA: Diagnosis not present

## 2018-11-03 DIAGNOSIS — M545 Low back pain: Secondary | ICD-10-CM | POA: Diagnosis not present

## 2018-11-03 DIAGNOSIS — M546 Pain in thoracic spine: Secondary | ICD-10-CM | POA: Diagnosis not present

## 2018-11-07 DIAGNOSIS — M545 Low back pain: Secondary | ICD-10-CM | POA: Diagnosis not present

## 2018-11-07 DIAGNOSIS — M542 Cervicalgia: Secondary | ICD-10-CM | POA: Diagnosis not present

## 2018-11-07 DIAGNOSIS — M9903 Segmental and somatic dysfunction of lumbar region: Secondary | ICD-10-CM | POA: Diagnosis not present

## 2018-11-07 DIAGNOSIS — M9902 Segmental and somatic dysfunction of thoracic region: Secondary | ICD-10-CM | POA: Diagnosis not present

## 2018-11-07 DIAGNOSIS — M546 Pain in thoracic spine: Secondary | ICD-10-CM | POA: Diagnosis not present

## 2018-11-07 DIAGNOSIS — M9901 Segmental and somatic dysfunction of cervical region: Secondary | ICD-10-CM | POA: Diagnosis not present

## 2018-11-12 DIAGNOSIS — H6121 Impacted cerumen, right ear: Secondary | ICD-10-CM | POA: Diagnosis not present

## 2018-11-12 DIAGNOSIS — I209 Angina pectoris, unspecified: Secondary | ICD-10-CM | POA: Diagnosis not present

## 2018-11-12 DIAGNOSIS — J019 Acute sinusitis, unspecified: Secondary | ICD-10-CM | POA: Diagnosis not present

## 2018-11-12 DIAGNOSIS — R070 Pain in throat: Secondary | ICD-10-CM | POA: Diagnosis not present

## 2018-11-14 DIAGNOSIS — M9902 Segmental and somatic dysfunction of thoracic region: Secondary | ICD-10-CM | POA: Diagnosis not present

## 2018-11-14 DIAGNOSIS — M545 Low back pain: Secondary | ICD-10-CM | POA: Diagnosis not present

## 2018-11-14 DIAGNOSIS — M9903 Segmental and somatic dysfunction of lumbar region: Secondary | ICD-10-CM | POA: Diagnosis not present

## 2018-11-14 DIAGNOSIS — M546 Pain in thoracic spine: Secondary | ICD-10-CM | POA: Diagnosis not present

## 2018-11-14 DIAGNOSIS — M542 Cervicalgia: Secondary | ICD-10-CM | POA: Diagnosis not present

## 2018-11-14 DIAGNOSIS — M9901 Segmental and somatic dysfunction of cervical region: Secondary | ICD-10-CM | POA: Diagnosis not present

## 2018-11-17 ENCOUNTER — Other Ambulatory Visit: Payer: Self-pay

## 2018-12-01 DIAGNOSIS — M9901 Segmental and somatic dysfunction of cervical region: Secondary | ICD-10-CM | POA: Diagnosis not present

## 2018-12-01 DIAGNOSIS — M9903 Segmental and somatic dysfunction of lumbar region: Secondary | ICD-10-CM | POA: Diagnosis not present

## 2018-12-01 DIAGNOSIS — M546 Pain in thoracic spine: Secondary | ICD-10-CM | POA: Diagnosis not present

## 2018-12-01 DIAGNOSIS — I10 Essential (primary) hypertension: Secondary | ICD-10-CM | POA: Diagnosis not present

## 2018-12-01 DIAGNOSIS — M542 Cervicalgia: Secondary | ICD-10-CM | POA: Diagnosis not present

## 2018-12-01 DIAGNOSIS — F411 Generalized anxiety disorder: Secondary | ICD-10-CM | POA: Diagnosis not present

## 2018-12-01 DIAGNOSIS — J45909 Unspecified asthma, uncomplicated: Secondary | ICD-10-CM | POA: Diagnosis not present

## 2018-12-01 DIAGNOSIS — E782 Mixed hyperlipidemia: Secondary | ICD-10-CM | POA: Diagnosis not present

## 2018-12-01 DIAGNOSIS — K219 Gastro-esophageal reflux disease without esophagitis: Secondary | ICD-10-CM | POA: Diagnosis not present

## 2018-12-01 DIAGNOSIS — K589 Irritable bowel syndrome without diarrhea: Secondary | ICD-10-CM | POA: Diagnosis not present

## 2018-12-01 DIAGNOSIS — M9902 Segmental and somatic dysfunction of thoracic region: Secondary | ICD-10-CM | POA: Diagnosis not present

## 2018-12-01 DIAGNOSIS — M545 Low back pain: Secondary | ICD-10-CM | POA: Diagnosis not present

## 2018-12-03 DIAGNOSIS — Z6830 Body mass index (BMI) 30.0-30.9, adult: Secondary | ICD-10-CM | POA: Diagnosis not present

## 2018-12-03 DIAGNOSIS — M1711 Unilateral primary osteoarthritis, right knee: Secondary | ICD-10-CM | POA: Diagnosis not present

## 2018-12-03 DIAGNOSIS — Z23 Encounter for immunization: Secondary | ICD-10-CM | POA: Diagnosis not present

## 2018-12-03 DIAGNOSIS — R062 Wheezing: Secondary | ICD-10-CM | POA: Diagnosis not present

## 2018-12-03 DIAGNOSIS — J209 Acute bronchitis, unspecified: Secondary | ICD-10-CM | POA: Diagnosis not present

## 2018-12-03 DIAGNOSIS — L0201 Cutaneous abscess of face: Secondary | ICD-10-CM | POA: Diagnosis not present

## 2018-12-03 DIAGNOSIS — Z6829 Body mass index (BMI) 29.0-29.9, adult: Secondary | ICD-10-CM | POA: Diagnosis not present

## 2018-12-03 DIAGNOSIS — M25511 Pain in right shoulder: Secondary | ICD-10-CM | POA: Diagnosis not present

## 2018-12-03 DIAGNOSIS — Z0000 Encounter for general adult medical examination without abnormal findings: Secondary | ICD-10-CM | POA: Diagnosis not present

## 2018-12-03 DIAGNOSIS — J019 Acute sinusitis, unspecified: Secondary | ICD-10-CM | POA: Diagnosis not present

## 2018-12-03 DIAGNOSIS — F419 Anxiety disorder, unspecified: Secondary | ICD-10-CM | POA: Diagnosis not present

## 2018-12-03 DIAGNOSIS — R05 Cough: Secondary | ICD-10-CM | POA: Diagnosis not present

## 2018-12-03 DIAGNOSIS — M25561 Pain in right knee: Secondary | ICD-10-CM | POA: Diagnosis not present

## 2018-12-15 DIAGNOSIS — M9901 Segmental and somatic dysfunction of cervical region: Secondary | ICD-10-CM | POA: Diagnosis not present

## 2018-12-15 DIAGNOSIS — M9903 Segmental and somatic dysfunction of lumbar region: Secondary | ICD-10-CM | POA: Diagnosis not present

## 2018-12-15 DIAGNOSIS — M546 Pain in thoracic spine: Secondary | ICD-10-CM | POA: Diagnosis not present

## 2018-12-15 DIAGNOSIS — M542 Cervicalgia: Secondary | ICD-10-CM | POA: Diagnosis not present

## 2018-12-15 DIAGNOSIS — M545 Low back pain: Secondary | ICD-10-CM | POA: Diagnosis not present

## 2018-12-15 DIAGNOSIS — M9902 Segmental and somatic dysfunction of thoracic region: Secondary | ICD-10-CM | POA: Diagnosis not present

## 2018-12-26 DIAGNOSIS — J45909 Unspecified asthma, uncomplicated: Secondary | ICD-10-CM | POA: Diagnosis not present

## 2018-12-26 DIAGNOSIS — I10 Essential (primary) hypertension: Secondary | ICD-10-CM | POA: Diagnosis not present

## 2018-12-26 DIAGNOSIS — F411 Generalized anxiety disorder: Secondary | ICD-10-CM | POA: Diagnosis not present

## 2018-12-26 DIAGNOSIS — K589 Irritable bowel syndrome without diarrhea: Secondary | ICD-10-CM | POA: Diagnosis not present

## 2018-12-26 DIAGNOSIS — E782 Mixed hyperlipidemia: Secondary | ICD-10-CM | POA: Diagnosis not present

## 2018-12-26 DIAGNOSIS — K219 Gastro-esophageal reflux disease without esophagitis: Secondary | ICD-10-CM | POA: Diagnosis not present

## 2018-12-30 DIAGNOSIS — I10 Essential (primary) hypertension: Secondary | ICD-10-CM | POA: Diagnosis not present

## 2018-12-30 DIAGNOSIS — E782 Mixed hyperlipidemia: Secondary | ICD-10-CM | POA: Diagnosis not present

## 2018-12-30 DIAGNOSIS — E785 Hyperlipidemia, unspecified: Secondary | ICD-10-CM | POA: Diagnosis not present

## 2019-01-02 ENCOUNTER — Other Ambulatory Visit: Payer: Self-pay

## 2019-01-02 ENCOUNTER — Encounter: Payer: Self-pay | Admitting: Cardiology

## 2019-01-02 ENCOUNTER — Ambulatory Visit (INDEPENDENT_AMBULATORY_CARE_PROVIDER_SITE_OTHER): Payer: PPO | Admitting: Cardiology

## 2019-01-02 VITALS — BP 116/74 | HR 77 | Temp 97.3°F | Ht 63.5 in | Wt 201.0 lb

## 2019-01-02 DIAGNOSIS — R0789 Other chest pain: Secondary | ICD-10-CM

## 2019-01-02 NOTE — Progress Notes (Signed)
Clinical Summary Ms. Laspisa is a 73 y.o.female seen as new consult, referred by Dr Nevada Crane for chest pain.  1. Chest pain - squeezing feeling midchest. Tends to occur at rest or exertion. Sometimes with mental stress - occurs 2-3 times a years. Ongoing for about 5 years, unchanged.  - lasts about 5 minutes. 8-9/10 in severity. Worst with deep breathing, but no SOB - can be better with leaning forward - no relation to food  - no significant exertional symptoms   CAD risk factors: HTN, age   Past Medical History:  Diagnosis Date  . Anxiety   . Arthritis   . Asthma   . Cancer ([place])    skin and colon  . GERD (gastroesophageal reflux disease)   . Gout   . Hypertension      Allergies  Allergen Reactions  . Pantoprazole Sodium     Constipation, GI pain  . Latex Rash  . Sulfa Antibiotics Swelling     Current Outpatient Medications  Medication Sig Dispense Refill  . albuterol (PROVENTIL HFA;VENTOLIN HFA) 108 (90 Base) MCG/ACT inhaler Inhale 2 puffs into the lungs every 6 (six) hours as needed for wheezing or shortness of breath.    . calcium carbonate (OS-CAL) 1250 MG chewable tablet Chew 1 tablet by mouth daily.     . Cholecalciferol (CVS VIT D 5000 HIGH-POTENCY) 5000 UNITS capsule Take 5,000 Units by mouth daily.     Marland Kitchen docusate sodium (COLACE) 250 MG capsule Take 250 mg by mouth daily as needed for constipation.    Marland Kitchen EXFORGE HCT 10-320-25 MG TABS Take 1 tablet by mouth daily.     . hydrochlorothiazide (HYDRODIURIL) 25 MG tablet Take 25 mg by mouth daily.    Marland Kitchen ibuprofen (ADVIL,MOTRIN) 200 MG tablet Take 200-400 mg by mouth daily as needed.     Marland Kitchen levocetirizine (XYZAL) 5 MG tablet Take 5 mg by mouth at bedtime.    Marland Kitchen LORazepam (ATIVAN) 0.5 MG tablet Take 0.5 mg by mouth 2 (two) times daily as needed for anxiety.     Marland Kitchen lubiprostone (AMITIZA) 24 MCG capsule TAKE ONE CAPSULE BY MOUTH TWICE DAILY WITH A MEAL. 60 capsule 5  . meloxicam (MOBIC) 15 MG tablet Take 15 mg by mouth  daily.    . Menthol-Methyl Salicylate (MUSCLE RUB) 10-15 % CREA Apply 1 application topically as needed for muscle pain.    . methocarbamol (ROBAXIN) 500 MG tablet Take 500 mg by mouth every 8 (eight) hours as needed for muscle spasms.    . Multiple Vitamin (MULTIVITAMIN WITH MINERALS) TABS tablet Take 1 tablet by mouth daily.    . RABEprazole (ACIPHEX) 20 MG tablet Take 1 tablet (20 mg total) by mouth daily. 30 minutes before breakfast 90 tablet 3  . Tetrahydrozoline HCl (VISINE OP) Place 1 drop into both eyes daily as needed (dry eyes).    . valsartan (DIOVAN) 320 MG tablet Take 320 mg by mouth daily.    . Wheat Dextrin (BENEFIBER) POWD Take by mouth. 1 packet=1 tbsp once daily     No current facility-administered medications for this visit.      Past Surgical History:  Procedure Laterality Date  . ABDOMINAL HYSTERECTOMY     fibroid tumors  . APPENDECTOMY    . BACK SURGERY    . BREAST CYST EXCISION Right   . COLONOSCOPY  09/16/2007   [id number] canal hemorrhoids, otherwise normal rectum left-sided diverticula and colonic mucosa appeared normal.  . COLONOSCOPY N/A 03/02/2013  Dr. Chauncy Lean adenoma. Colonic diverticulosis. Anal canal hemorrhoids-likely source of hematochezia. Surveillance 2019  . COLONOSCOPY WITH PROPOFOL N/A 07/29/2017   Procedure: COLONOSCOPY WITH PROPOFOL;  Surgeon: Daneil Dolin, MD;  [facility];  Service: Endoscopy;  Laterality: N/A;  1:00pm  . ESOPHAGOGASTRODUODENOSCOPY  09/16/2007   [id number] undulating Z-line, tiny distal esophageal  erosions consistent with mild erosive reflux esophagitis, patulous EG junction, small hiatal hernia, otherwise normal stomach, D1, and D2.  . HEMORRHOID SURGERY     X 2      Allergies  Allergen Reactions  . Pantoprazole Sodium     Constipation, GI pain  . Latex Rash  . Sulfa Antibiotics Swelling      Family History  Problem Relation Age of Onset  . Colon cancer Brother        64, deceased  .  Breast cancer Sister 35  . Breast cancer Maternal Aunt 60     Social History Ms. Magnone reports that she has quit smoking. Her smoking use included cigarettes. She has a 0.75 pack-year smoking history. She has never used smokeless tobacco. Ms. Axtman reports current alcohol use.   Review of Systems CONSTITUTIONAL: No weight loss, fever, chills, weakness or fatigue.  HEENT: Eyes: No visual loss, blurred vision, double vision or yellow sclerae.No hearing loss, sneezing, congestion, runny nose or sore throat.  SKIN: No rash or itching.  CARDIOVASCULAR: per hpi RESPIRATORY: No shortness of breath, cough or sputum.  GASTROINTESTINAL: No anorexia, nausea, vomiting or diarrhea. No abdominal pain or blood.  GENITOURINARY: No burning on urination, no polyuria NEUROLOGICAL: No headache, dizziness, syncope, paralysis, ataxia, numbness or tingling in the extremities. No change in bowel or bladder control.  MUSCULOSKELETAL: No muscle, back pain, joint pain or stiffness.  LYMPHATICS: No enlarged nodes. No history of splenectomy.  PSYCHIATRIC: No history of depression or anxiety.  ENDOCRINOLOGIC: No reports of sweating, cold or heat intolerance. No polyuria or polydipsia.  Marland Kitchen   Physical Examination Today's Vitals   01/02/19 1016  BP: 116/74  Pulse: 77  Temp: (!) 97.3 F (36.3 C)  Weight: 201 lb (91.2 kg)  Height: 5' 3.5" (1.613 m)   Body mass index is 35.05 kg/m.  Gen: resting comfortably, no acute distress HEENT: no scleral icterus, pupils equal round and reactive, no palptable cervical adenopathy,  CV: RRR, no m/r/g, no jvd Resp: Clear to auscultation bilaterally GI: abdomen is soft, non-tender, non-distended, normal bowel sounds, no hepatosplenomegaly MSK: extremities are warm, no edema.  Skin: warm, no rash Neuro:  no focal deficits Psych: appropriate affect    Assessment and Plan  1. Chest pain - atypical symptoms. Not exertional, only occur 2-3 times a year, unchanged since  started 5 years ago, worst with deep breathing - would monitor at this time, if significant change in symptoms consider ischemic testing at that time  - EKG from pcp shows SR, no ischemic changes   F/u 6 months   Arnoldo Lenis, M.D.

## 2019-01-02 NOTE — Patient Instructions (Signed)

## 2019-01-06 DIAGNOSIS — R351 Nocturia: Secondary | ICD-10-CM | POA: Diagnosis not present

## 2019-01-06 DIAGNOSIS — I10 Essential (primary) hypertension: Secondary | ICD-10-CM | POA: Diagnosis not present

## 2019-01-06 DIAGNOSIS — K588 Other irritable bowel syndrome: Secondary | ICD-10-CM | POA: Diagnosis not present

## 2019-01-06 DIAGNOSIS — K649 Unspecified hemorrhoids: Secondary | ICD-10-CM | POA: Diagnosis not present

## 2019-01-06 DIAGNOSIS — K219 Gastro-esophageal reflux disease without esophagitis: Secondary | ICD-10-CM | POA: Diagnosis not present

## 2019-01-06 DIAGNOSIS — M545 Low back pain: Secondary | ICD-10-CM | POA: Diagnosis not present

## 2019-01-06 DIAGNOSIS — F411 Generalized anxiety disorder: Secondary | ICD-10-CM | POA: Diagnosis not present

## 2019-01-06 DIAGNOSIS — J4520 Mild intermittent asthma, uncomplicated: Secondary | ICD-10-CM | POA: Diagnosis not present

## 2019-01-06 DIAGNOSIS — E782 Mixed hyperlipidemia: Secondary | ICD-10-CM | POA: Diagnosis not present

## 2019-01-12 DIAGNOSIS — M546 Pain in thoracic spine: Secondary | ICD-10-CM | POA: Diagnosis not present

## 2019-01-12 DIAGNOSIS — M545 Low back pain: Secondary | ICD-10-CM | POA: Diagnosis not present

## 2019-01-12 DIAGNOSIS — M9902 Segmental and somatic dysfunction of thoracic region: Secondary | ICD-10-CM | POA: Diagnosis not present

## 2019-01-12 DIAGNOSIS — M542 Cervicalgia: Secondary | ICD-10-CM | POA: Diagnosis not present

## 2019-01-12 DIAGNOSIS — M9903 Segmental and somatic dysfunction of lumbar region: Secondary | ICD-10-CM | POA: Diagnosis not present

## 2019-01-12 DIAGNOSIS — M9901 Segmental and somatic dysfunction of cervical region: Secondary | ICD-10-CM | POA: Diagnosis not present

## 2019-01-13 ENCOUNTER — Telehealth: Payer: Self-pay | Admitting: Internal Medicine

## 2019-01-13 NOTE — Telephone Encounter (Signed)
Lmom, waiting on a return call.  

## 2019-01-13 NOTE — Telephone Encounter (Signed)
Pt was requested something for her hemorrhoid pain. She uses [facility]. Please advise. [phone]

## 2019-01-14 MED ORDER — HYDROCORTISONE (PERIANAL) 2.5 % EX CREA: 1.0000 "application " | TOPICAL_CREAM | Freq: Two times a day (BID) | CUTANEOUS | 1 refills | Status: DC

## 2019-01-14 NOTE — Telephone Encounter (Signed)
Pt notified that RX for hemorrhoids was sent into [place].

## 2019-01-14 NOTE — Telephone Encounter (Signed)
Pt returned call. Pt is having some discomfort with her hemorrhoids. Pt has some pain after she has a bowel movement. Pt has previously gotten a topical cream sent into [facility]. Pt wasn't able to remember the name of the topical that was called in for her but states she has used just about every otc topical along with prescriptions.

## 2019-01-14 NOTE — Telephone Encounter (Signed)
Contacted [facility]. There is no documentation of her in the compounding system. I have called in the [place] hemorrhoid cream with lidocaine. 1 refill. Use BID for a week at a time. Call if no improvement and will need office visit.

## 2019-01-14 NOTE — Addendum Note (Signed)
Addended by: Annitta Needs on: 01/14/2019 12:38 PM   Modules accepted: Orders

## 2019-01-21 DIAGNOSIS — K589 Irritable bowel syndrome without diarrhea: Secondary | ICD-10-CM | POA: Diagnosis not present

## 2019-01-21 DIAGNOSIS — E782 Mixed hyperlipidemia: Secondary | ICD-10-CM | POA: Diagnosis not present

## 2019-01-21 DIAGNOSIS — F411 Generalized anxiety disorder: Secondary | ICD-10-CM | POA: Diagnosis not present

## 2019-01-21 DIAGNOSIS — K219 Gastro-esophageal reflux disease without esophagitis: Secondary | ICD-10-CM | POA: Diagnosis not present

## 2019-01-21 DIAGNOSIS — I10 Essential (primary) hypertension: Secondary | ICD-10-CM | POA: Diagnosis not present

## 2019-01-21 DIAGNOSIS — J45909 Unspecified asthma, uncomplicated: Secondary | ICD-10-CM | POA: Diagnosis not present

## 2019-02-04 ENCOUNTER — Telehealth: Payer: Self-pay

## 2019-02-04 MED ORDER — RABEPRAZOLE SODIUM 20 MG PO TBEC: 20.0000 mg | DELAYED_RELEASE_TABLET | Freq: Every day | ORAL | 3 refills | Status: DC

## 2019-02-04 NOTE — Telephone Encounter (Signed)
[facility] called to request a refill on the Rabeprazole 20 mg. They would like the RX sent electronically.

## 2019-02-04 NOTE — Telephone Encounter (Signed)
Done

## 2019-02-04 NOTE — Addendum Note (Signed)
Addended by: Annitta Needs on: 02/04/2019 03:34 PM   Modules accepted: Orders

## 2019-02-04 NOTE — Addendum Note (Signed)
Addended by: Annitta Needs on: 02/04/2019 12:35 PM   Modules accepted: Orders

## 2019-02-09 DIAGNOSIS — M9901 Segmental and somatic dysfunction of cervical region: Secondary | ICD-10-CM | POA: Diagnosis not present

## 2019-02-09 DIAGNOSIS — M9903 Segmental and somatic dysfunction of lumbar region: Secondary | ICD-10-CM | POA: Diagnosis not present

## 2019-02-09 DIAGNOSIS — M545 Low back pain: Secondary | ICD-10-CM | POA: Diagnosis not present

## 2019-02-09 DIAGNOSIS — M546 Pain in thoracic spine: Secondary | ICD-10-CM | POA: Diagnosis not present

## 2019-02-09 DIAGNOSIS — M9902 Segmental and somatic dysfunction of thoracic region: Secondary | ICD-10-CM | POA: Diagnosis not present

## 2019-02-09 DIAGNOSIS — M542 Cervicalgia: Secondary | ICD-10-CM | POA: Diagnosis not present

## 2019-02-26 DIAGNOSIS — K589 Irritable bowel syndrome without diarrhea: Secondary | ICD-10-CM | POA: Diagnosis not present

## 2019-02-26 DIAGNOSIS — K219 Gastro-esophageal reflux disease without esophagitis: Secondary | ICD-10-CM | POA: Diagnosis not present

## 2019-02-26 DIAGNOSIS — F411 Generalized anxiety disorder: Secondary | ICD-10-CM | POA: Diagnosis not present

## 2019-02-26 DIAGNOSIS — E782 Mixed hyperlipidemia: Secondary | ICD-10-CM | POA: Diagnosis not present

## 2019-02-26 DIAGNOSIS — I10 Essential (primary) hypertension: Secondary | ICD-10-CM | POA: Diagnosis not present

## 2019-02-26 DIAGNOSIS — J45909 Unspecified asthma, uncomplicated: Secondary | ICD-10-CM | POA: Diagnosis not present

## 2019-03-06 DIAGNOSIS — Z23 Encounter for immunization: Secondary | ICD-10-CM | POA: Diagnosis not present

## 2019-03-17 DIAGNOSIS — K219 Gastro-esophageal reflux disease without esophagitis: Secondary | ICD-10-CM | POA: Diagnosis not present

## 2019-03-17 DIAGNOSIS — J45909 Unspecified asthma, uncomplicated: Secondary | ICD-10-CM | POA: Diagnosis not present

## 2019-03-17 DIAGNOSIS — I10 Essential (primary) hypertension: Secondary | ICD-10-CM | POA: Diagnosis not present

## 2019-03-17 DIAGNOSIS — K589 Irritable bowel syndrome without diarrhea: Secondary | ICD-10-CM | POA: Diagnosis not present

## 2019-03-17 DIAGNOSIS — F411 Generalized anxiety disorder: Secondary | ICD-10-CM | POA: Diagnosis not present

## 2019-03-17 DIAGNOSIS — E782 Mixed hyperlipidemia: Secondary | ICD-10-CM | POA: Diagnosis not present

## 2019-04-27 DIAGNOSIS — J45909 Unspecified asthma, uncomplicated: Secondary | ICD-10-CM | POA: Diagnosis not present

## 2019-04-27 DIAGNOSIS — E782 Mixed hyperlipidemia: Secondary | ICD-10-CM | POA: Diagnosis not present

## 2019-04-27 DIAGNOSIS — I10 Essential (primary) hypertension: Secondary | ICD-10-CM | POA: Diagnosis not present

## 2019-04-27 DIAGNOSIS — K219 Gastro-esophageal reflux disease without esophagitis: Secondary | ICD-10-CM | POA: Diagnosis not present

## 2019-04-27 DIAGNOSIS — F411 Generalized anxiety disorder: Secondary | ICD-10-CM | POA: Diagnosis not present

## 2019-04-27 DIAGNOSIS — K589 Irritable bowel syndrome without diarrhea: Secondary | ICD-10-CM | POA: Diagnosis not present

## 2019-04-29 DIAGNOSIS — M79644 Pain in right finger(s): Secondary | ICD-10-CM | POA: Diagnosis not present

## 2019-04-29 DIAGNOSIS — M5431 Sciatica, right side: Secondary | ICD-10-CM | POA: Diagnosis not present

## 2019-05-04 DIAGNOSIS — M9902 Segmental and somatic dysfunction of thoracic region: Secondary | ICD-10-CM | POA: Diagnosis not present

## 2019-05-04 DIAGNOSIS — M546 Pain in thoracic spine: Secondary | ICD-10-CM | POA: Diagnosis not present

## 2019-05-04 DIAGNOSIS — M5441 Lumbago with sciatica, right side: Secondary | ICD-10-CM | POA: Diagnosis not present

## 2019-05-04 DIAGNOSIS — M9901 Segmental and somatic dysfunction of cervical region: Secondary | ICD-10-CM | POA: Diagnosis not present

## 2019-05-04 DIAGNOSIS — M542 Cervicalgia: Secondary | ICD-10-CM | POA: Diagnosis not present

## 2019-05-04 DIAGNOSIS — M9903 Segmental and somatic dysfunction of lumbar region: Secondary | ICD-10-CM | POA: Diagnosis not present

## 2019-05-27 DIAGNOSIS — I10 Essential (primary) hypertension: Secondary | ICD-10-CM | POA: Diagnosis not present

## 2019-05-27 DIAGNOSIS — K589 Irritable bowel syndrome without diarrhea: Secondary | ICD-10-CM | POA: Diagnosis not present

## 2019-05-27 DIAGNOSIS — J45909 Unspecified asthma, uncomplicated: Secondary | ICD-10-CM | POA: Diagnosis not present

## 2019-05-27 DIAGNOSIS — F411 Generalized anxiety disorder: Secondary | ICD-10-CM | POA: Diagnosis not present

## 2019-05-27 DIAGNOSIS — E782 Mixed hyperlipidemia: Secondary | ICD-10-CM | POA: Diagnosis not present

## 2019-05-27 DIAGNOSIS — K219 Gastro-esophageal reflux disease without esophagitis: Secondary | ICD-10-CM | POA: Diagnosis not present

## 2019-06-01 ENCOUNTER — Encounter: Payer: Self-pay | Admitting: Internal Medicine

## 2019-06-01 DIAGNOSIS — M546 Pain in thoracic spine: Secondary | ICD-10-CM | POA: Diagnosis not present

## 2019-06-01 DIAGNOSIS — M9903 Segmental and somatic dysfunction of lumbar region: Secondary | ICD-10-CM | POA: Diagnosis not present

## 2019-06-01 DIAGNOSIS — M9901 Segmental and somatic dysfunction of cervical region: Secondary | ICD-10-CM | POA: Diagnosis not present

## 2019-06-01 DIAGNOSIS — M9902 Segmental and somatic dysfunction of thoracic region: Secondary | ICD-10-CM | POA: Diagnosis not present

## 2019-06-01 DIAGNOSIS — M5441 Lumbago with sciatica, right side: Secondary | ICD-10-CM | POA: Diagnosis not present

## 2019-06-01 DIAGNOSIS — M542 Cervicalgia: Secondary | ICD-10-CM | POA: Diagnosis not present

## 2019-06-23 DIAGNOSIS — I10 Essential (primary) hypertension: Secondary | ICD-10-CM | POA: Diagnosis not present

## 2019-06-23 DIAGNOSIS — F411 Generalized anxiety disorder: Secondary | ICD-10-CM | POA: Diagnosis not present

## 2019-06-23 DIAGNOSIS — E782 Mixed hyperlipidemia: Secondary | ICD-10-CM | POA: Diagnosis not present

## 2019-06-23 DIAGNOSIS — K219 Gastro-esophageal reflux disease without esophagitis: Secondary | ICD-10-CM | POA: Diagnosis not present

## 2019-06-23 DIAGNOSIS — J45909 Unspecified asthma, uncomplicated: Secondary | ICD-10-CM | POA: Diagnosis not present

## 2019-06-23 DIAGNOSIS — K589 Irritable bowel syndrome without diarrhea: Secondary | ICD-10-CM | POA: Diagnosis not present

## 2019-07-08 DIAGNOSIS — Z85828 Personal history of other malignant neoplasm of skin: Secondary | ICD-10-CM | POA: Diagnosis not present

## 2019-07-08 DIAGNOSIS — Z85820 Personal history of malignant melanoma of skin: Secondary | ICD-10-CM | POA: Diagnosis not present

## 2019-07-08 DIAGNOSIS — L570 Actinic keratosis: Secondary | ICD-10-CM | POA: Diagnosis not present

## 2019-07-08 DIAGNOSIS — L821 Other seborrheic keratosis: Secondary | ICD-10-CM | POA: Diagnosis not present

## 2019-07-09 DIAGNOSIS — E782 Mixed hyperlipidemia: Secondary | ICD-10-CM | POA: Diagnosis not present

## 2019-07-09 DIAGNOSIS — M25511 Pain in right shoulder: Secondary | ICD-10-CM | POA: Diagnosis not present

## 2019-07-09 DIAGNOSIS — J45909 Unspecified asthma, uncomplicated: Secondary | ICD-10-CM | POA: Diagnosis not present

## 2019-07-09 DIAGNOSIS — J4520 Mild intermittent asthma, uncomplicated: Secondary | ICD-10-CM | POA: Diagnosis not present

## 2019-07-09 DIAGNOSIS — R351 Nocturia: Secondary | ICD-10-CM | POA: Diagnosis not present

## 2019-07-09 DIAGNOSIS — M1711 Unilateral primary osteoarthritis, right knee: Secondary | ICD-10-CM | POA: Diagnosis not present

## 2019-07-09 DIAGNOSIS — F411 Generalized anxiety disorder: Secondary | ICD-10-CM | POA: Diagnosis not present

## 2019-07-09 DIAGNOSIS — E785 Hyperlipidemia, unspecified: Secondary | ICD-10-CM | POA: Diagnosis not present

## 2019-07-09 DIAGNOSIS — Z0000 Encounter for general adult medical examination without abnormal findings: Secondary | ICD-10-CM | POA: Diagnosis not present

## 2019-07-09 DIAGNOSIS — K219 Gastro-esophageal reflux disease without esophagitis: Secondary | ICD-10-CM | POA: Diagnosis not present

## 2019-07-09 DIAGNOSIS — Z23 Encounter for immunization: Secondary | ICD-10-CM | POA: Diagnosis not present

## 2019-07-14 DIAGNOSIS — G4700 Insomnia, unspecified: Secondary | ICD-10-CM | POA: Diagnosis not present

## 2019-07-14 DIAGNOSIS — K588 Other irritable bowel syndrome: Secondary | ICD-10-CM | POA: Diagnosis not present

## 2019-07-14 DIAGNOSIS — R0789 Other chest pain: Secondary | ICD-10-CM | POA: Diagnosis not present

## 2019-07-14 DIAGNOSIS — K649 Unspecified hemorrhoids: Secondary | ICD-10-CM | POA: Diagnosis not present

## 2019-07-14 DIAGNOSIS — K219 Gastro-esophageal reflux disease without esophagitis: Secondary | ICD-10-CM | POA: Diagnosis not present

## 2019-07-14 DIAGNOSIS — J4520 Mild intermittent asthma, uncomplicated: Secondary | ICD-10-CM | POA: Diagnosis not present

## 2019-07-14 DIAGNOSIS — I10 Essential (primary) hypertension: Secondary | ICD-10-CM | POA: Diagnosis not present

## 2019-07-14 DIAGNOSIS — F411 Generalized anxiety disorder: Secondary | ICD-10-CM | POA: Diagnosis not present

## 2019-07-14 DIAGNOSIS — Z0001 Encounter for general adult medical examination with abnormal findings: Secondary | ICD-10-CM | POA: Diagnosis not present

## 2019-07-14 DIAGNOSIS — E669 Obesity, unspecified: Secondary | ICD-10-CM | POA: Diagnosis not present

## 2019-07-14 DIAGNOSIS — E782 Mixed hyperlipidemia: Secondary | ICD-10-CM | POA: Diagnosis not present

## 2019-07-14 DIAGNOSIS — R351 Nocturia: Secondary | ICD-10-CM | POA: Diagnosis not present

## 2019-07-14 DIAGNOSIS — M545 Low back pain: Secondary | ICD-10-CM | POA: Diagnosis not present

## 2019-07-22 DIAGNOSIS — F411 Generalized anxiety disorder: Secondary | ICD-10-CM | POA: Diagnosis not present

## 2019-07-22 DIAGNOSIS — K219 Gastro-esophageal reflux disease without esophagitis: Secondary | ICD-10-CM | POA: Diagnosis not present

## 2019-07-22 DIAGNOSIS — I10 Essential (primary) hypertension: Secondary | ICD-10-CM | POA: Diagnosis not present

## 2019-07-22 DIAGNOSIS — J45909 Unspecified asthma, uncomplicated: Secondary | ICD-10-CM | POA: Diagnosis not present

## 2019-07-22 DIAGNOSIS — E782 Mixed hyperlipidemia: Secondary | ICD-10-CM | POA: Diagnosis not present

## 2019-07-22 DIAGNOSIS — K589 Irritable bowel syndrome without diarrhea: Secondary | ICD-10-CM | POA: Diagnosis not present

## 2019-07-27 DIAGNOSIS — M9901 Segmental and somatic dysfunction of cervical region: Secondary | ICD-10-CM | POA: Diagnosis not present

## 2019-07-27 DIAGNOSIS — M546 Pain in thoracic spine: Secondary | ICD-10-CM | POA: Diagnosis not present

## 2019-07-27 DIAGNOSIS — M9902 Segmental and somatic dysfunction of thoracic region: Secondary | ICD-10-CM | POA: Diagnosis not present

## 2019-07-27 DIAGNOSIS — M5441 Lumbago with sciatica, right side: Secondary | ICD-10-CM | POA: Diagnosis not present

## 2019-07-27 DIAGNOSIS — M9903 Segmental and somatic dysfunction of lumbar region: Secondary | ICD-10-CM | POA: Diagnosis not present

## 2019-07-27 DIAGNOSIS — M542 Cervicalgia: Secondary | ICD-10-CM | POA: Diagnosis not present

## 2019-08-17 DIAGNOSIS — F411 Generalized anxiety disorder: Secondary | ICD-10-CM | POA: Diagnosis not present

## 2019-08-17 DIAGNOSIS — J45909 Unspecified asthma, uncomplicated: Secondary | ICD-10-CM | POA: Diagnosis not present

## 2019-08-17 DIAGNOSIS — K589 Irritable bowel syndrome without diarrhea: Secondary | ICD-10-CM | POA: Diagnosis not present

## 2019-08-17 DIAGNOSIS — K219 Gastro-esophageal reflux disease without esophagitis: Secondary | ICD-10-CM | POA: Diagnosis not present

## 2019-08-17 DIAGNOSIS — I10 Essential (primary) hypertension: Secondary | ICD-10-CM | POA: Diagnosis not present

## 2019-08-17 DIAGNOSIS — E782 Mixed hyperlipidemia: Secondary | ICD-10-CM | POA: Diagnosis not present

## 2019-09-17 DIAGNOSIS — K219 Gastro-esophageal reflux disease without esophagitis: Secondary | ICD-10-CM | POA: Diagnosis not present

## 2019-09-17 DIAGNOSIS — K589 Irritable bowel syndrome without diarrhea: Secondary | ICD-10-CM | POA: Diagnosis not present

## 2019-09-17 DIAGNOSIS — I10 Essential (primary) hypertension: Secondary | ICD-10-CM | POA: Diagnosis not present

## 2019-09-17 DIAGNOSIS — E782 Mixed hyperlipidemia: Secondary | ICD-10-CM | POA: Diagnosis not present

## 2019-09-17 DIAGNOSIS — J45909 Unspecified asthma, uncomplicated: Secondary | ICD-10-CM | POA: Diagnosis not present

## 2019-09-17 DIAGNOSIS — F411 Generalized anxiety disorder: Secondary | ICD-10-CM | POA: Diagnosis not present

## 2019-10-22 DIAGNOSIS — H11153 Pinguecula, bilateral: Secondary | ICD-10-CM | POA: Diagnosis not present

## 2019-10-22 DIAGNOSIS — K219 Gastro-esophageal reflux disease without esophagitis: Secondary | ICD-10-CM | POA: Diagnosis not present

## 2019-10-22 DIAGNOSIS — F411 Generalized anxiety disorder: Secondary | ICD-10-CM | POA: Diagnosis not present

## 2019-10-22 DIAGNOSIS — J45909 Unspecified asthma, uncomplicated: Secondary | ICD-10-CM | POA: Diagnosis not present

## 2019-10-22 DIAGNOSIS — H25813 Combined forms of age-related cataract, bilateral: Secondary | ICD-10-CM | POA: Diagnosis not present

## 2019-10-22 DIAGNOSIS — E782 Mixed hyperlipidemia: Secondary | ICD-10-CM | POA: Diagnosis not present

## 2019-10-22 DIAGNOSIS — I10 Essential (primary) hypertension: Secondary | ICD-10-CM | POA: Diagnosis not present

## 2019-10-22 DIAGNOSIS — K589 Irritable bowel syndrome without diarrhea: Secondary | ICD-10-CM | POA: Diagnosis not present

## 2019-10-26 ENCOUNTER — Telehealth: Payer: Self-pay

## 2019-10-26 DIAGNOSIS — M9902 Segmental and somatic dysfunction of thoracic region: Secondary | ICD-10-CM | POA: Diagnosis not present

## 2019-10-26 DIAGNOSIS — M9903 Segmental and somatic dysfunction of lumbar region: Secondary | ICD-10-CM | POA: Diagnosis not present

## 2019-10-26 DIAGNOSIS — M546 Pain in thoracic spine: Secondary | ICD-10-CM | POA: Diagnosis not present

## 2019-10-26 DIAGNOSIS — M542 Cervicalgia: Secondary | ICD-10-CM | POA: Diagnosis not present

## 2019-10-26 DIAGNOSIS — K5909 Other constipation: Secondary | ICD-10-CM

## 2019-10-26 DIAGNOSIS — M5441 Lumbago with sciatica, right side: Secondary | ICD-10-CM | POA: Diagnosis not present

## 2019-10-26 DIAGNOSIS — M9901 Segmental and somatic dysfunction of cervical region: Secondary | ICD-10-CM | POA: Diagnosis not present

## 2019-10-26 NOTE — Telephone Encounter (Signed)
Refill request received from [facility] for Amitiza 24 mcg daily with a meal.

## 2019-10-30 MED ORDER — LUBIPROSTONE 24 MCG PO CAPS: ORAL_CAPSULE | ORAL | 5 refills | Status: DC

## 2019-10-30 NOTE — Telephone Encounter (Signed)
Rx sent per patient request  

## 2019-10-30 NOTE — Addendum Note (Signed)
Addended by: Gordy Levan,  A on: 10/30/2019 10:27 AM   Modules accepted: Orders

## 2019-11-10 NOTE — H&P (Signed)
Surgical History & Physical  Patient Name: Kendra Orozco DOB: 1946-02-03  Surgery: Cataract extraction with intraocular lens implant phacoemulsification; Right Eye  Surgeon: Baruch Goldmann MD Surgery Date:  11/20/2019 Pre-Op Date:  10/22/2019  HPI: A 83 Yr. old female patient 1. The patient complains of difficulty when viewing TV, reading closed caption, news scrolls on TV, which began many years ago. Both eyes are affected. The episode is constant. The condition is worse with daily activities. The complaint is associated with blurry vision. 2. 2. The patient complains of difficulty when reading fine print, books, newspaper, instructions etc., which began many years ago. Both eyes are affected. The condition is worse with intensive visual activity (reading, computer). This is negatively affecting the patient's quality of life. HPI Completed by Dr. Baruch Goldmann  Medical History: Cataracts h/o drop therapy for elevated IOP Arthritis Broken ankle 06/28/2017 Acid reflux Cancer High Blood Pressure Lung Problems  Review of Systems Allergic/Immunologic Hay Fever Cardiovascular High Blood Pressure Psychiatry Anxiety Respiratory Asthma All recorded systems are negative except as noted above.  Social   Never Smoked  Medication Breo Ellipta, Albuterol, Levocetirizine, Lorazepam, Hydrochlorothiazide, Olmesamadox, Rabeprazole, Amitiza, Methocarbam, Meloxicam, Fluticasone, Mucinex, Benadryl, Advil, Multivitamin, ESTER-C, Calcium Supplement, Vitamin D3, Lipo flavonoid, Dulcolax, Preparation H, Benefiber, Probiotic,   Sx/Procedures Lower back surgery, L4/L5, Hysterectomy, Hemorrhoid surgery, Precancerous polypectomy, colon, Appendectomy, Benign cyst removal breast, Moles removed, Tumors removed from spine,   Drug Allergies  Sulfa,   History & Physical: Heent:  Cataract, Right eye NECK: supple without bruits LUNGS: lungs clear to auscultation CV: regular rate and rhythm Abdomen: soft and  non-tender  Impression & Plan: Assessment: 1.  COMBINED FORMS AGE RELATED CATARACT; Both Eyes (H25.813) 2.  Pinguecula; Both Eyes (H11.153)  Plan: 1.  Cataract accounts for the patient's decreased vision. This visual impairment is not correctable with a tolerable change in glasses or contact lenses. Cataract surgery with an implantation of a new lens should significantly improve the visual and functional status of the patient. Discussed all risks, benefits, alternatives, and potential complications. Discussed the procedures and recovery. Patient desires to have surgery. A-scan ordered and performed today for intra-ocular lens calculations. The surgery will be performed in order to improve vision for driving, reading, and for eye examinations. Recommend phacoemulsification with intra-ocular lens. Right Eye worse - first. Dilates poorly - shugacaine by protocol. Malyugin Ring. 2.  Observe; Artificial tears as needed for irritation.

## 2019-11-16 DIAGNOSIS — H25811 Combined forms of age-related cataract, right eye: Secondary | ICD-10-CM | POA: Diagnosis not present

## 2019-11-16 NOTE — Patient Instructions (Signed)
Kendra Orozco  11/16/2019     @PREFPERIOPPHARMACY @   Your procedure is scheduled on   11/20/2019   Report to [facility] at  [place].M.  Call this number if you have problems the morning of surgery:  [phone]   Remember:  Do not eat or drink after midnight.                       Take these medicines the morning of surgery with A SIP OF WATER  Ativan (if needed), mobic(if needed), robaxin (if needed), aciphex.    Do not wear jewelry, make-up or nail polish.  Do not wear lotions, powders, or perfumes. Please wear deodorant and brush your teeth.  Do not shave 48 hours prior to surgery.  Men may shave face and neck.  Do not bring valuables to the hospital.  [facility] is not responsible for any belongings or valuables.  Contacts, dentures or bridgework may not be worn into surgery.  Leave your suitcase in the car.  After surgery it may be brought to your room.  For patients admitted to the hospital, discharge time will be determined by your treatment team.  Patients discharged the day of surgery will not be allowed to drive home.   Name and phone number of your driver:   family Special instructions:  DO NOT smoke the morning of your procedure.  Please read over the following fact sheets that you were given. Anesthesia Post-op Instructions and Care and Recovery After Surgery      Cataract Surgery, Care After This sheet gives you information about how to care for yourself after your procedure. Your health care provider may also give you more specific instructions. If you have problems or questions, contact your health care provider. What can I expect after the procedure? After the procedure, it is common to have:  Itching.  Discomfort.  Fluid discharge.  Sensitivity to light and to touch.  Bruising in or around the eye.  Mild blurred vision. Follow these instructions at home: Eye care   Do not touch or rub your eyes.  Protect your eyes as told by your  health care provider. You may be told to wear a protective eye shield or sunglasses.  Do not put a contact lens into the affected eye or eyes until your health care provider approves.  Keep the area around your eye clean and dry: ? Avoid swimming. ? Do not allow water to hit you directly in the face while showering. ? Keep soap and shampoo out of your eyes.  Check your eye every day for signs of infection. Watch for: ? Redness, swelling, or pain. ? Fluid, blood, or pus. ? Warmth. ? A bad smell. ? Vision that is getting worse. ? Sensitivity that is getting worse. Activity  Do not drive for 24 hours if you were given a sedative during your procedure.  Avoid strenuous activities, such as playing contact sports, for as long as told by your health care provider.  Do not drive or use heavy machinery until your health care provider approves.  Do not bend or lift heavy objects. Bending increases pressure in the eye. You can walk, climb stairs, and do light household chores.  Ask your health care provider when you can return to work. If you work in a dusty environment, you may be advised to wear protective eyewear for a period of time. General instructions  Take or apply over-the-counter  and prescription medicines only as told by your health care provider. This includes eye drops.  Keep all follow-up visits as told by your health care provider. This is important. Contact a health care provider if:  You have increased bruising around your eye.  You have pain that is not helped with medicine.  You have a fever.  You have redness, swelling, or pain in your eye.  You have fluid, blood, or pus coming from your incision.  Your vision gets worse.  Your sensitivity to light gets worse. Get help right away if:  You have sudden loss of vision.  You see flashes of light or spots (floaters).  You have severe eye pain.  You develop nausea or vomiting. Summary  After your  procedure, it is common to have itching, discomfort, bruising, fluid discharge, or sensitivity to light.  Follow instructions from your health care provider about caring for your eye after the procedure.  Do not rub your eye after the procedure. You may need to wear eye protection or sunglasses. Do not wear contact lenses. Keep the area around your eye clean and dry.  Avoid activities that require a lot of effort. These include playing sports and lifting heavy objects.  Contact a health care provider if you have increased bruising, pain that does not go away, or a fever. Get help right away if you suddenly lose your vision, see flashes of light or spots, or have severe pain in the eye. This information is not intended to replace advice given to you by your health care provider. Make sure you discuss any questions you have with your health care provider. Document Revised: 01/27/2019 Document Reviewed: 09/30/2017 Elsevier Patient Education  [place].

## 2019-11-17 ENCOUNTER — Encounter (HOSPITAL_COMMUNITY): Payer: Self-pay

## 2019-11-17 ENCOUNTER — Other Ambulatory Visit: Payer: Self-pay

## 2019-11-17 ENCOUNTER — Encounter (HOSPITAL_COMMUNITY)
Admission: RE | Admit: 2019-11-17 | Discharge: 2019-11-17 | Disposition: A | Discharge status: Home / Self Care | Payer: PPO | Source: Ambulatory Visit | Attending: Ophthalmology | Admitting: Ophthalmology

## 2019-11-17 DIAGNOSIS — Z20822 Contact with and (suspected) exposure to covid-19: Secondary | ICD-10-CM | POA: Diagnosis not present

## 2019-11-17 DIAGNOSIS — I10 Essential (primary) hypertension: Secondary | ICD-10-CM | POA: Diagnosis not present

## 2019-11-17 DIAGNOSIS — F411 Generalized anxiety disorder: Secondary | ICD-10-CM | POA: Diagnosis not present

## 2019-11-17 DIAGNOSIS — E782 Mixed hyperlipidemia: Secondary | ICD-10-CM | POA: Diagnosis not present

## 2019-11-17 DIAGNOSIS — J45909 Unspecified asthma, uncomplicated: Secondary | ICD-10-CM | POA: Diagnosis not present

## 2019-11-17 DIAGNOSIS — Z01812 Encounter for preprocedural laboratory examination: Secondary | ICD-10-CM | POA: Diagnosis not present

## 2019-11-17 DIAGNOSIS — K219 Gastro-esophageal reflux disease without esophagitis: Secondary | ICD-10-CM | POA: Diagnosis not present

## 2019-11-17 DIAGNOSIS — K589 Irritable bowel syndrome without diarrhea: Secondary | ICD-10-CM | POA: Diagnosis not present

## 2019-11-17 LAB — BASIC METABOLIC PANEL
Anion gap: 12 (ref 5–15)
BUN: 19 mg/dL (ref 8–23)
CO2: 22 mmol/L (ref 22–32)
Calcium: 8.9 mg/dL (ref 8.9–10.3)
Chloride: 102 mmol/L (ref 98–111)
Creatinine, Ser: 0.79 mg/dL (ref 0.44–1.00)
GFR calc Af Amer: >60 mL/min (ref >60)
GFR calc non Af Amer: >60 mL/min (ref >60)
Glucose, Bld: 141 mg/dL — ABNORMAL HIGH (ref 70–99)
Potassium: 3.1 mmol/L — ABNORMAL LOW (ref 3.5–5.1)
Sodium: 136 mmol/L (ref 135–145)

## 2019-11-18 ENCOUNTER — Other Ambulatory Visit (HOSPITAL_COMMUNITY)
Admission: RE | Admit: 2019-11-18 | Discharge: 2019-11-18 | Disposition: A | Discharge status: Home / Self Care | Payer: PPO | Source: Ambulatory Visit | Attending: Ophthalmology | Admitting: Ophthalmology

## 2019-11-18 DIAGNOSIS — Z01812 Encounter for preprocedural laboratory examination: Secondary | ICD-10-CM | POA: Insufficient documentation

## 2019-11-18 DIAGNOSIS — Z20822 Contact with and (suspected) exposure to covid-19: Secondary | ICD-10-CM | POA: Diagnosis not present

## 2019-11-18 LAB — SARS CORONAVIRUS 2 (TAT 6-24 HRS): SARS Coronavirus 2: NEGATIVE

## 2019-11-20 ENCOUNTER — Ambulatory Visit (HOSPITAL_COMMUNITY): Payer: PPO | Admitting: Anesthesiology

## 2019-11-20 ENCOUNTER — Encounter (HOSPITAL_COMMUNITY)
Admission: RE | Disposition: A | Discharge status: Home / Self Care | Payer: Self-pay | Source: Home / Self Care | Attending: Ophthalmology

## 2019-11-20 ENCOUNTER — Ambulatory Visit (HOSPITAL_COMMUNITY)
Admission: RE | Admit: 2019-11-20 | Discharge: 2019-11-20 | Disposition: A | Discharge status: Home / Self Care | Payer: PPO | Attending: Ophthalmology | Admitting: Ophthalmology

## 2019-11-20 DIAGNOSIS — H25813 Combined forms of age-related cataract, bilateral: Secondary | ICD-10-CM | POA: Diagnosis not present

## 2019-11-20 DIAGNOSIS — H25811 Combined forms of age-related cataract, right eye: Secondary | ICD-10-CM | POA: Diagnosis not present

## 2019-11-20 DIAGNOSIS — Z791 Long term (current) use of non-steroidal anti-inflammatories (NSAID): Secondary | ICD-10-CM | POA: Diagnosis not present

## 2019-11-20 DIAGNOSIS — Z87891 Personal history of nicotine dependence: Secondary | ICD-10-CM | POA: Diagnosis not present

## 2019-11-20 DIAGNOSIS — F419 Anxiety disorder, unspecified: Secondary | ICD-10-CM | POA: Diagnosis not present

## 2019-11-20 DIAGNOSIS — Z7951 Long term (current) use of inhaled steroids: Secondary | ICD-10-CM | POA: Diagnosis not present

## 2019-11-20 DIAGNOSIS — I10 Essential (primary) hypertension: Secondary | ICD-10-CM | POA: Diagnosis not present

## 2019-11-20 DIAGNOSIS — K219 Gastro-esophageal reflux disease without esophagitis: Secondary | ICD-10-CM | POA: Diagnosis not present

## 2019-11-20 DIAGNOSIS — Z79899 Other long term (current) drug therapy: Secondary | ICD-10-CM | POA: Diagnosis not present

## 2019-11-20 DIAGNOSIS — J45909 Unspecified asthma, uncomplicated: Secondary | ICD-10-CM | POA: Diagnosis not present

## 2019-11-20 DIAGNOSIS — H11153 Pinguecula, bilateral: Secondary | ICD-10-CM | POA: Insufficient documentation

## 2019-11-20 HISTORY — PX: CATARACT EXTRACTION W/PHACO: SHX586

## 2019-11-20 SURGERY — PHACOEMULSIFICATION, CATARACT, WITH IOL INSERTION
Anesthesia: Monitor Anesthesia Care | Site: Eye | Laterality: Right

## 2019-11-20 MED ORDER — TETRACAINE HCL 0.5 % OP SOLN
1.0000 [drp] | OPHTHALMIC | Status: AC | PRN
  Administered 2019-11-20 (×3): 1 [drp] via OPHTHALMIC

## 2019-11-20 MED ORDER — CYCLOPENTOLATE-PHENYLEPHRINE 0.2-1 % OP SOLN
1.0000 [drp] | OPHTHALMIC | Status: DC | PRN
  Administered 2019-11-20 (×2): 1 [drp] via OPHTHALMIC

## 2019-11-20 MED ORDER — LIDOCAINE HCL (PF) 1 % IJ SOLN
INTRAOCULAR | Status: DC | PRN
  Administered 2019-11-20: 1 mL via OPHTHALMIC

## 2019-11-20 MED ORDER — MIDAZOLAM HCL 5 MG/5ML IJ SOLN
INTRAMUSCULAR | Status: DC | PRN
  Administered 2019-11-20 (×2): 1 mg via INTRAVENOUS

## 2019-11-20 MED ORDER — POVIDONE-IODINE 5 % OP SOLN
OPHTHALMIC | Status: DC | PRN
  Administered 2019-11-20: 1 via OPHTHALMIC

## 2019-11-20 MED ORDER — MIDAZOLAM HCL 2 MG/2ML IJ SOLN
INTRAMUSCULAR | Status: AC
  Filled 2019-11-20: qty 2

## 2019-11-20 MED ORDER — SODIUM HYALURONATE 23 MG/ML IO SOLN
INTRAOCULAR | Status: DC | PRN
  Administered 2019-11-20: 0.6 mL via INTRAOCULAR

## 2019-11-20 MED ORDER — LIDOCAINE HCL 3.5 % OP GEL: 1.0000 "application " | Freq: Once | OPHTHALMIC | Status: DC

## 2019-11-20 MED ORDER — PROVISC 10 MG/ML IO SOLN
INTRAOCULAR | Status: DC | PRN
  Administered 2019-11-20: 0.85 mL via INTRAOCULAR

## 2019-11-20 MED ORDER — BSS IO SOLN
INTRAOCULAR | Status: DC | PRN
  Administered 2019-11-20: 15 mL via INTRAOCULAR

## 2019-11-20 MED ORDER — LACTATED RINGERS IV SOLN: INTRAVENOUS | Status: DC

## 2019-11-20 MED ORDER — EPINEPHRINE PF 1 MG/ML IJ SOLN
INTRAOCULAR | Status: DC | PRN
  Administered 2019-11-20: 500 mL

## 2019-11-20 MED ORDER — PHENYLEPHRINE HCL 2.5 % OP SOLN
1.0000 [drp] | OPHTHALMIC | Status: DC | PRN
  Administered 2019-11-20 (×2): 1 [drp] via OPHTHALMIC

## 2019-11-20 SURGICAL SUPPLY — 13 items
CLOTH BEACON ORANGE TIMEOUT ST (SAFETY) ×2 IMPLANT
EYE SHIELD UNIVERSAL CLEAR (GAUZE/BANDAGES/DRESSINGS) ×3 IMPLANT
GLOVE BIOGEL PI IND STRL 7.0 (GLOVE) IMPLANT
GLOVE BIOGEL PI INDICATOR 7.0 (GLOVE) ×4
GLOVE SURG SS PI 7.5 STRL IVOR (GLOVE) ×2 IMPLANT
LENS ALC ACRYL/TECN (Ophthalmic Related) ×2 IMPLANT
NEEDLE HYPO 18GX1.5 BLUNT FILL (NEEDLE) ×3 IMPLANT
PAD ARMBOARD 7.5X6 YLW CONV (MISCELLANEOUS) ×3 IMPLANT
SYR TB 1ML LL NO SAFETY (SYRINGE) ×2 IMPLANT
TAPE SURG TRANSPORE 1 IN (GAUZE/BANDAGES/DRESSINGS) IMPLANT
TAPE SURGICAL TRANSPORE 1 IN (GAUZE/BANDAGES/DRESSINGS) ×3
VISCOELASTIC ADDITIONAL (OPHTHALMIC RELATED) ×2 IMPLANT
WATER STERILE IRR 250ML POUR (IV SOLUTION) ×2 IMPLANT

## 2019-11-20 NOTE — Anesthesia Preprocedure Evaluation (Signed)
Anesthesia Evaluation  Patient identified by MRN, date of birth, ID band Patient awake    Reviewed: Allergy & Precautions, NPO status , Patient's Chart, lab work & pertinent test results  History of Anesthesia Complications Negative for: history of anesthetic complications  Airway Mallampati: III  TM Distance: >3 FB Neck ROM: Full    Dental  (+) Teeth Intact   Pulmonary asthma , former smoker,    Pulmonary exam normal breath sounds clear to auscultation       Cardiovascular METS: 3 - Mets hypertension, Pt. on medications Normal cardiovascular exam Rhythm:Regular Rate:Normal     Neuro/Psych Anxiety    GI/Hepatic Neg liver ROS, GERD  Medicated and Controlled,  Endo/Other  negative endocrine ROS  Renal/GU negative Renal ROS     Musculoskeletal  (+) Arthritis , Osteoarthritis,    Abdominal   Peds  Hematology negative hematology ROS (+)   Anesthesia Other Findings   Reproductive/Obstetrics negative OB ROS                             Anesthesia Physical  Anesthesia Plan  ASA: II  Anesthesia Plan: MAC   Post-op Pain Management:    Induction:   PONV Risk Score and Plan:   Airway Management Planned: Nasal Cannula and Natural Airway  Additional Equipment:   Intra-op Plan:   Post-operative Plan:   Informed Consent: I have reviewed the patients History and Physical, chart, labs and discussed the procedure including the risks, benefits and alternatives for the proposed anesthesia with the patient or authorized representative who has indicated his/her understanding and acceptance.     Dental advisory given  Plan Discussed with: CRNA and Surgeon  Anesthesia Plan Comments:         Anesthesia Quick Evaluation  

## 2019-11-20 NOTE — Interval H&P Note (Signed)
History and Physical Interval Note:  11/20/2019 8:51 AM  Kendra Orozco  has presented today for surgery, with the diagnosis of Nuclear sclerotic cataract - Right eye.  The various methods of treatment have been discussed with the patient and family. After consideration of risks, benefits and other options for treatment, the patient has consented to  Procedure(s) with comments: CATARACT EXTRACTION PHACO AND INTRAOCULAR LENS PLACEMENT (IOC) (Right) - CDE:  as a surgical intervention.  The patient's history has been reviewed, patient examined, no change in status, stable for surgery.  I have reviewed the patient's chart and labs.  Questions were answered to the patient's satisfaction.     Baruch Goldmann

## 2019-11-20 NOTE — Op Note (Signed)
Date of procedure: 11/20/19  Pre-operative diagnosis:  Visually significant combined form age-related cataract, Right Eye (H25.811)  Post-operative diagnosis:  Visually significant combined form age-related cataract, Right Eye (H25.811)  Procedure: Removal of cataract via phacoemulsification and insertion of intra-ocular lens [employer] DCB00  +27.0D into the capsular bag of the Right Eye  Attending surgeon: Gerda Diss. , MD, MA  Anesthesia: MAC, Topical Ak[place]  Complications: None  Estimated Blood Loss: <58m (minimal)  Specimens: None  Implants: As above  Indications:  Visually significant age-related cataract, Right Eye  Procedure:  The patient was seen and identified in the pre-operative area. The operative eye was identified and dilated.  The operative eye was marked.  Topical anesthesia was administered to the operative eye.     The patient was then to the operative suite and placed in the supine position.  A timeout was performed confirming the patient, procedure to be performed, and all other relevant information.   The patient's face was prepped and draped in the usual fashion for intra-ocular surgery.  A lid speculum was placed into the operative eye and the surgical microscope moved into place and focused.  A superotemporal paracentesis was created using a 20 gauge paracentesis blade.  Shugarcaine was injected into the anterior chamber.  Viscoelastic was injected into the anterior chamber.  A temporal clear-corneal main wound incision was created using a 2.444mmicrokeratome.  A continuous curvilinear capsulorrhexis was initiated using an irrigating cystitome and completed using capsulorrhexis forceps.  Hydrodissection and hydrodeliniation were performed.  Viscoelastic was injected into the anterior chamber.  A phacoemulsification handpiece and a chopper as a second instrument were used to remove the nucleus and epinucleus. The irrigation/aspiration handpiece was  used to remove any remaining cortical material.   The capsular bag was reinflated with viscoelastic, checked, and found to be intact.  The intraocular lens was inserted into the capsular bag.  The irrigation/aspiration handpiece was used to remove any remaining viscoelastic.  The clear corneal wound and paracentesis wounds were then hydrated and checked with Weck-Cels to be watertight.  The lid-speculum was removed.  The drape was removed.  The patient's face was cleaned with a wet and dry 4x4.  A clear shield was taped over the eye. The patient was taken to the post-operative care unit in good condition, having tolerated the procedure well.  Post-Op Instructions: [facility] and will receive all other orders and instructions.

## 2019-11-20 NOTE — Discharge Instructions (Signed)
Please discharge patient when stable, will follow up today with Dr.  at the [facility] [place] office immediately following discharge.  Leave shield in place until visit.  All paperwork with discharge instructions will be given at the office.  [facility] Address:  [address]  [place], [place] [address]             Monitored Anesthesia Care, Care After These instructions provide you with information about caring for yourself after your procedure. Your health care provider may also give you more specific instructions. Your treatment has been planned according to current medical practices, but problems sometimes occur. Call your health care provider if you have any problems or questions after your procedure. What can I expect after the procedure? After your procedure, you may:  Feel sleepy for several hours.  Feel clumsy and have poor balance for several hours.  Feel forgetful about what happened after the procedure.  Have poor judgment for several hours.  Feel nauseous or vomit.  Have a sore throat if you had a breathing tube during the procedure. Follow these instructions at home: For at least 24 hours after the procedure:      Have a responsible adult stay with you. It is important to have someone help care for you until you are awake and alert.  Rest as needed.  Do not: ? Participate in activities in which you could fall or become injured. ? Drive. ? Use heavy machinery. ? Drink alcohol. ? Take sleeping pills or medicines that cause drowsiness. ? Make important decisions or sign legal documents. ? Take care of children on your own. Eating and drinking  Follow the diet that is recommended by your health care provider.  If you vomit, drink water, juice, or soup when you can drink without vomiting.  Make sure you have little or no nausea before eating solid foods. General instructions  Take over-the-counter and  prescription medicines only as told by your health care provider.  If you have sleep apnea, surgery and certain medicines can increase your risk for breathing problems. Follow instructions from your health care provider about wearing your sleep device: ? Anytime you are sleeping, including during daytime naps. ? While taking prescription pain medicines, sleeping medicines, or medicines that make you drowsy.  If you smoke, do not smoke without supervision.  Keep all follow-up visits as told by your health care provider. This is important. Contact a health care provider if:  You keep feeling nauseous or you keep vomiting.  You feel light-headed.  You develop a rash.  You have a fever. Get help right away if:  You have trouble breathing. Summary  For several hours after your procedure, you may feel sleepy and have poor judgment.  Have a responsible adult stay with you for at least 24 hours or until you are awake and alert. This information is not intended to replace advice given to you by your health care provider. Make sure you discuss any questions you have with your health care provider. Document Revised: 07/01/2017 Document Reviewed: 07/24/2015 Elsevier Patient Education  2020 Elsevier Inc.  

## 2019-11-20 NOTE — Transfer of Care (Signed)
Immediate Anesthesia Transfer of Care Note  Patient: Kendra Orozco  Procedure(s) Performed: CATARACT EXTRACTION PHACO AND INTRAOCULAR LENS PLACEMENT (IOC) RIGHT EYE (Right Eye)  Patient Location: Short Stay  Anesthesia Type:MAC  Level of Consciousness: awake, alert , oriented and patient cooperative  Airway & Oxygen Therapy: Patient Spontanous Breathing  Post-op Assessment: Report given to RN, Post -op Vital signs reviewed and stable and Patient moving all extremities  Post vital signs: Reviewed and stable  Last Vitals:  Vitals Value Taken Time  BP    Temp    Pulse    Resp    SpO2      Last Pain:  Vitals:   11/20/19 0809  TempSrc: Oral  PainSc: 3       Patients Stated Pain Goal: 6 (02/14/27 1188)  Complications: No complications documented.

## 2019-11-20 NOTE — Anesthesia Postprocedure Evaluation (Signed)
Anesthesia Post Note  Patient: Kendra Orozco  Procedure(s) Performed: CATARACT EXTRACTION PHACO AND INTRAOCULAR LENS PLACEMENT (IOC) RIGHT EYE (Right Eye)  Patient location during evaluation: Phase II Anesthesia Type: MAC Level of consciousness: awake, oriented, awake and alert and patient cooperative Pain management: pain level controlled Vital Signs Assessment: post-procedure vital signs reviewed and stable Respiratory status: spontaneous breathing, respiratory function stable and nonlabored ventilation Cardiovascular status: blood pressure returned to baseline Postop Assessment: no headache and no backache Anesthetic complications: no   No complications documented.   Last Vitals:  Vitals:   11/20/19 0809  BP: 114/74  Pulse: 76  Resp: 18  Temp: 36.5 C  SpO2: 97%    Last Pain:  Vitals:   11/20/19 0809  TempSrc: Oral  PainSc: 3                  Tacy Learn

## 2019-11-23 ENCOUNTER — Encounter (HOSPITAL_COMMUNITY): Payer: Self-pay | Admitting: Ophthalmology

## 2019-11-23 NOTE — Progress Notes (Signed)
Referring Provider: Celene Squibb, MD Primary Care Physician:  Celene Squibb, MD Primary GI: Dr. Gala Romney   Chief Complaint  Patient presents with  . abdominal swelling    HPI:   Kendra Orozco is a 74 y.o. female presenting today with a history of constipation and GERD. Here for routine follow-up.   Has had several nocturnal episodes of GERD. Feels like gravel in her throat. Eats between 5-6 pm. Eats snack around 9pm but doesn't go to bed till midnight. Doesn't sleep much. Doesn't get sleepy. Sleeps on 2 pillows. Has had a wedge before but gets uncomfortable. Takes Aciphex once per day. Has taken Mylanta and Tums as needed. Has been on BID in the past and didn't have any of this trouble at night before. Feels like food doesn't digest within a reasonable time. Sometimes burps up food with the acid. Loves spicy food but stays away from it. Tries to avoid fried foods but will eat once in awhile. Puffed up after eating oatmeal this morning. Hasn't had a good BM today.   BM in the mornings. Taking Amitiza BID with food. Benefiber with chocolate milk in evenings. States she can easily gain 10 lbs without significant changes in her diet. Up 7 lbs from last visit.   Past Medical History:  Diagnosis Date  . Anxiety   . Arthritis   . Asthma   . Cancer ([place])    skin and colon  . GERD (gastroesophageal reflux disease)   . Gout   . Hypertension     Past Surgical History:  Procedure Laterality Date  . ABDOMINAL HYSTERECTOMY     fibroid tumors  . APPENDECTOMY    . BACK SURGERY    . BREAST CYST EXCISION Right   . CATARACT EXTRACTION W/PHACO Right 11/20/2019   Procedure: CATARACT EXTRACTION PHACO AND INTRAOCULAR LENS PLACEMENT ([place]) RIGHT EYE;  Surgeon: Baruch Goldmann, MD;  Location: [facility];  Service: Ophthalmology;  Laterality: Right;  CDE: 17.30  . COLONOSCOPY  09/16/2007   XBJ:YNWG canal hemorrhoids, otherwise normal rectum left-sided diverticula and colonic mucosa appeared normal.  .  COLONOSCOPY N/A 03/02/2013   Dr. Chauncy Lean adenoma. Colonic diverticulosis. Anal canal hemorrhoids-likely source of hematochezia. Surveillance 2019  . COLONOSCOPY WITH PROPOFOL N/A 07/29/2017   Non-bleeding internal hemorrhoids, cecal AVMs.   . ESOPHAGOGASTRODUODENOSCOPY  09/16/2007   NFA:OZHYQMVHQIO undulating Z-line, tiny distal esophageal  erosions consistent with mild erosive reflux esophagitis, patulous EG junction, small hiatal hernia, otherwise normal stomach, D1, and D2.  . HEMORRHOID SURGERY     X 2     Current Outpatient Medications  Medication Sig Dispense Refill  . albuterol (PROVENTIL HFA;VENTOLIN HFA) 108 (90 Base) MCG/ACT inhaler Inhale 2 puffs into the lungs every 4 (four) hours as needed for wheezing or shortness of breath.     . calcium carbonate (OS-CAL) 1250 MG chewable tablet Chew 1 tablet by mouth 2 (two) times a week.     . Cholecalciferol ([employer] VIT D 5000 HIGH-POTENCY) 125 MCG (5000 UT) capsule Take 5,000 Units by mouth daily.     . diphenhydrAMINE (BENADRYL) 25 mg capsule Take 25 mg by mouth daily as needed for allergies.    Marland Kitchen docusate sodium (COLACE) 250 MG capsule Take 250 mg by mouth daily as needed for constipation.    . fluticasone furoate-vilanterol (BREO ELLIPTA) 100-25 MCG/INH AEPB Inhale 1 puff into the lungs daily.    . hydrochlorothiazide (HYDRODIURIL) 25 MG tablet Take 25 mg by mouth daily.    Marland Kitchen  ibuprofen (ADVIL,MOTRIN) 200 MG tablet Take 200-400 mg by mouth daily as needed.     Marland Kitchen levocetirizine (XYZAL) 5 MG tablet Take 5 mg by mouth at bedtime.    Marland Kitchen LORazepam (ATIVAN) 0.5 MG tablet Take 0.5 mg by mouth 2 (two) times daily as needed for anxiety.     Marland Kitchen lubiprostone (AMITIZA) 24 MCG capsule TAKE ONE CAPSULE BY MOUTH TWICE DAILY WITH A MEAL. (Patient taking differently: Take 24 mcg by mouth 2 (two) times daily with a meal. TAKE ONE CAPSULE BY MOUTH TWICE DAILY WITH A MEAL.) 60 capsule 5  . meloxicam (MOBIC) 15 MG tablet Take 15 mg by mouth daily as needed  for pain.     . Menthol-Methyl Salicylate (MUSCLE RUB) 10-15 % CREA Apply 1 application topically as needed for muscle pain.    . methocarbamol (ROBAXIN) 500 MG tablet Take 500 mg by mouth every 8 (eight) hours as needed for muscle spasms.    . Multiple Vitamin (MULTIVITAMIN WITH MINERALS) TABS tablet Take 1 tablet by mouth daily.    Marland Kitchen olmesartan (BENICAR) 40 MG tablet Take 40 mg by mouth every morning.    . Polyvinyl Alcohol-Povidone (REFRESH OP) Apply to eye as needed.    . Wheat Dextrin (BENEFIBER) POWD Take 1 Package by mouth in the morning and at bedtime. 1 packet=1 tbsp    . RABEprazole (ACIPHEX) 20 MG tablet Take 1 tablet (20 mg total) by mouth 2 (two) times daily before a meal. 180 tablet 3   No current facility-administered medications for this visit.    Allergies as of 11/24/2019 - Review Complete 11/24/2019  Allergen Reaction Noted  . Pantoprazole sodium  07/18/2017  . Latex Rash 02/16/2013  . Sulfa antibiotics Swelling 02/11/2013    Family History  Problem Relation Age of Onset  . Colon cancer Brother        34, deceased  . Breast cancer Sister 22  . Breast cancer Maternal Aunt 25    Social History   Socioeconomic History  . Marital status: Divorced    Spouse name: Not on file  . Number of children: Not on file  . Years of education: Not on file  . Highest education level: Not on file  Occupational History  . Not on file  Tobacco Use  . Smoking status: Former Smoker    Packs/day: 0.25    Years: 3.00    Pack years: 0.75    Types: Cigarettes  . Smokeless tobacco: Never Used  Vaping Use  . Vaping Use: Never used  Substance and Sexual Activity  . Alcohol use: Yes    Comment: occ  . Drug use: No  . Sexual activity: Not Currently    Birth control/protection: Surgical  Other Topics Concern  . Not on file  Social History Narrative  . Not on file   Social Determinants of Health   Financial Resource Strain:   . Difficulty of Paying Living Expenses:     Food Insecurity:   . Worried About Charity fundraiser in the Last Year:   . Arboriculturist in the Last Year:   Transportation Needs:   . Film/video editor (Medical):   Marland Kitchen Lack of Transportation (Non-Medical):   Physical Activity:   . Days of Exercise per Week:   . Minutes of Exercise per Session:   Stress:   . Feeling of Stress :   Social Connections:   . Frequency of Communication with Friends and Family:   . Frequency  of Social Gatherings with Friends and Family:   . Attends Religious Services:   . Active Member of Clubs or Organizations:   . Attends Archivist Meetings:   Marland Kitchen Marital Status:     Review of Systems: Gen: Denies fever, chills, anorexia. Denies fatigue, weakness, weight loss.  CV: Denies chest pain, palpitations, syncope, peripheral edema, and claudication. Resp: Denies dyspnea at rest, cough, wheezing, coughing up blood, and pleurisy. GI: see HPI Derm: Denies rash, itching, dry skin Psych: Denies depression, anxiety, memory loss, confusion. No homicidal or suicidal ideation.  Heme: Denies bruising, bleeding, and enlarged lymph nodes.  Physical Exam: BP 127/78   Pulse (!) 103   Temp (!) 96.9 F (36.1 C) (Temporal)   Ht 5\' 4"  (1.626 m)   Wt 208 lb (94.3 kg)   BMI 35.70 kg/m  General:   Alert and oriented. No distress noted. Pleasant and cooperative.  Head:  Normocephalic and atraumatic. Eyes:  Conjuctiva clear without scleral icterus. Mouth:  Mask in place Abdomen:  +BS, soft, non-tender and non-distended. No rebound or guarding. No HSM or masses noted. Msk:  Symmetrical without gross deformities. Normal posture. Extremities:  Without edema. Neurologic:  Alert and  oriented x4 Psych:  Alert and cooperative. Normal mood and affect.  ASSESSMENT: Kendra Orozco is a 74 y.o. female presenting today with history of GERD, constipation, for routine follow-up.  GERD not ideally managed. Previously has been on Aciphex BID but now once daily  dosing. From her description, likely dealing with LPR. Nocturnal episodes. Will increase Aciphex to BID for now. May take Pepcid prn. Continue with avoidance of late night eating, healthy choice options. I do note she has gained weight over the past few years, contributing to symptoms. May have element of delayed gastric emptying as well.   Constipation: continue Amitiza 24 mcg BID, benefiber.    PLAN:   Increase Aciphex to BID: prescription sent to pharmacy Pepcid prn at night Diet/behavior modification Amitiza 24 mcg BID Call if no improvement with GERD symptoms 4 month return  Annitta Needs, PhD, ANP-BC [facility]

## 2019-11-24 ENCOUNTER — Ambulatory Visit (INDEPENDENT_AMBULATORY_CARE_PROVIDER_SITE_OTHER): Payer: PPO | Admitting: Gastroenterology

## 2019-11-24 ENCOUNTER — Other Ambulatory Visit: Payer: Self-pay

## 2019-11-24 ENCOUNTER — Encounter: Payer: Self-pay | Admitting: Gastroenterology

## 2019-11-24 VITALS — BP 127/78 | HR 103 | Temp 96.9°F | Ht 64.0 in | Wt 208.0 lb

## 2019-11-24 DIAGNOSIS — K219 Gastro-esophageal reflux disease without esophagitis: Secondary | ICD-10-CM

## 2019-11-24 MED ORDER — RABEPRAZOLE SODIUM 20 MG PO TBEC: 20.0000 mg | DELAYED_RELEASE_TABLET | Freq: Two times a day (BID) | ORAL | 3 refills | Status: DC

## 2019-11-24 NOTE — Patient Instructions (Signed)
I have increased the Aciphex to twice a day, 30 minutes before breakfast and dinner. You can take pepcid (famotidine) as needed at bedtime. This is over the counter.   Call if persistent bloating and reflux despite these measures.  We will see you in 4 months!   I enjoyed seeing you again today! As you know, I value our relationship and want to provide genuine, compassionate, and quality care. I welcome your feedback. If you receive a survey regarding your visit,  I greatly appreciate you taking time to fill this out. See you next time!  Annitta Needs, PhD, ANP-BC [facility]

## 2019-11-27 ENCOUNTER — Ambulatory Visit: Payer: PPO | Admitting: Gastroenterology

## 2019-11-30 ENCOUNTER — Other Ambulatory Visit: Payer: Self-pay

## 2019-11-30 ENCOUNTER — Encounter (HOSPITAL_COMMUNITY)
Admission: RE | Admit: 2019-11-30 | Discharge: 2019-11-30 | Disposition: A | Discharge status: Home / Self Care | Payer: PPO | Source: Ambulatory Visit | Attending: Ophthalmology | Admitting: Ophthalmology

## 2019-11-30 DIAGNOSIS — H25812 Combined forms of age-related cataract, left eye: Secondary | ICD-10-CM | POA: Diagnosis not present

## 2019-11-30 NOTE — H&P (Signed)
Surgical History & Physical  Patient Name: Kendra Orozco DOB: [birth year]  Surgery: Cataract extraction with intraocular lens implant phacoemulsification; Left Eye  Surgeon: Baruch Goldmann MD Surgery Date:  12/04/2019 Pre-Op Date:  11/30/2019  HPI: A 50 Yr. old female patient The patient is returning after cataract surgery. The right eye is affected. Status post cataract surgery, which began 1 weeks ago: Since the last visit, the affected area is doing well. The patient's vision is improved. The patient experiences no diplopia. Patient is following medication instructions. Omni BID OD and Refresh prn OU. Pt states she is notice "flicking' in peripheral vision and some light sensitivity. Denies any increase in floaters since sx. The patient complains of difficulty when driving at night and haloes around lights. The left eye is affected. The episode is constant. The condition's severity is worsening. Distance vision is worse than near. The patient experiences no flashes, floater, shadow, curtain or veil. HPI was performed by Baruch Goldmann .  Medical History: Cataracts h/o drop therapy for elevated IOP Arthritis Broken ankle 06/28/2017 Acid reflux Cancer High Blood Pressure Lung Problems  Review of Systems Allergic/Immunologic Hay Fever Cardiovascular High Blood Pressure Psychiatry Anxiety Respiratory Asthma All recorded systems are negative except as noted above.  Social   Never Smoked  Medication Prednisolone-gatiflox-bromfenac,  Breo Ellipta, Albuterol, Levocetirizine, Lorazepam, Hydrochlorothiazide, Olmesamadox, Rabeprazole, Amitiza, Methocarbam, Meloxicam, Fluticasone, Mucinex, Benadryl, Advil, Multivitamin, ESTER-C, Calcium Supplement, Vitamin D3, Lipo flavonoid, Dulcolax, Preparation H, Benefiber, Probiotic,   Sx/Procedures Phaco c IOL OD,  Lower back surgery, L4/L5, Hysterectomy, Hemorrhoid surgery, Precancerous polypectomy, colon, Appendectomy, Benign cyst removal breast,  Moles removed, Tumors removed from spine,   Drug Allergies  Sulfa,   History & Physical: Heent:  Cataract, Left eye NECK: supple without bruits LUNGS: lungs clear to auscultation CV: regular rate and rhythm Abdomen: soft and non-tender  Impression & Plan: Assessment: 1.  CATARACT EXTRACTION STATUS; Right Eye (Z98.41) 2.  INTRAOCULAR LENS IOL (Z96.1) 3.  COMBINED FORMS AGE RELATED CATARACT; Left Eye (H25.812)  Plan: 1.  1 week after cataract surgery. Doing well with improved vision and normal eye pressure. Call with any problems or concerns. Continue Gati-Brom-Pred 2x/day for 3 more weeks. 2.  Stable. Doing well since surgery Continue Post-op medications 3.  Dilates poorly - shugacaine by protocol. Cataract accounts for the patient's decreased vision. This visual impairment is not correctable with a tolerable change in glasses or contact lenses. Cataract surgery with an implantation of a new lens should significantly improve the visual and functional status of the patient. Discussed all risks, benefits, alternatives, and potential complications. Discussed the procedures and recovery. Patient desires to have surgery. A-scan ordered and performed today for intra-ocular lens calculations. The surgery will be performed in order to improve vision for driving, reading, and for eye examinations. Recommend phacoemulsification with intra-ocular lens. Recommend Dextenza for post-operative pain and inflammation. Left Eye. Surgery required to correct imbalance of vision.

## 2019-12-01 ENCOUNTER — Encounter (HOSPITAL_COMMUNITY): Payer: Self-pay

## 2019-12-01 ENCOUNTER — Other Ambulatory Visit: Payer: Self-pay

## 2019-12-02 ENCOUNTER — Other Ambulatory Visit (HOSPITAL_COMMUNITY)
Admission: RE | Admit: 2019-12-02 | Discharge: 2019-12-02 | Disposition: A | Discharge status: Home / Self Care | Payer: PPO | Source: Ambulatory Visit | Attending: Ophthalmology | Admitting: Ophthalmology

## 2019-12-02 DIAGNOSIS — Z20822 Contact with and (suspected) exposure to covid-19: Secondary | ICD-10-CM | POA: Diagnosis not present

## 2019-12-02 DIAGNOSIS — Z01812 Encounter for preprocedural laboratory examination: Secondary | ICD-10-CM | POA: Diagnosis not present

## 2019-12-02 LAB — SARS CORONAVIRUS 2 (TAT 6-24 HRS): SARS Coronavirus 2: NEGATIVE

## 2019-12-04 ENCOUNTER — Encounter (HOSPITAL_COMMUNITY): Payer: Self-pay | Admitting: Ophthalmology

## 2019-12-04 ENCOUNTER — Ambulatory Visit (HOSPITAL_COMMUNITY)
Admission: RE | Admit: 2019-12-04 | Discharge: 2019-12-04 | Disposition: A | Discharge status: Home / Self Care | Payer: PPO | Attending: Ophthalmology | Admitting: Ophthalmology

## 2019-12-04 ENCOUNTER — Other Ambulatory Visit: Payer: Self-pay

## 2019-12-04 ENCOUNTER — Encounter (HOSPITAL_COMMUNITY)
Admission: RE | Disposition: A | Discharge status: Home / Self Care | Payer: Self-pay | Source: Home / Self Care | Attending: Ophthalmology

## 2019-12-04 ENCOUNTER — Ambulatory Visit (HOSPITAL_COMMUNITY): Payer: PPO | Admitting: Anesthesiology

## 2019-12-04 DIAGNOSIS — Z79899 Other long term (current) drug therapy: Secondary | ICD-10-CM | POA: Insufficient documentation

## 2019-12-04 DIAGNOSIS — Z87891 Personal history of nicotine dependence: Secondary | ICD-10-CM | POA: Diagnosis not present

## 2019-12-04 DIAGNOSIS — Z882 Allergy status to sulfonamides status: Secondary | ICD-10-CM | POA: Insufficient documentation

## 2019-12-04 DIAGNOSIS — Z7951 Long term (current) use of inhaled steroids: Secondary | ICD-10-CM | POA: Insufficient documentation

## 2019-12-04 DIAGNOSIS — H25812 Combined forms of age-related cataract, left eye: Secondary | ICD-10-CM | POA: Diagnosis not present

## 2019-12-04 DIAGNOSIS — Z859 Personal history of malignant neoplasm, unspecified: Secondary | ICD-10-CM | POA: Diagnosis not present

## 2019-12-04 DIAGNOSIS — Z791 Long term (current) use of non-steroidal anti-inflammatories (NSAID): Secondary | ICD-10-CM | POA: Insufficient documentation

## 2019-12-04 DIAGNOSIS — K219 Gastro-esophageal reflux disease without esophagitis: Secondary | ICD-10-CM | POA: Diagnosis not present

## 2019-12-04 DIAGNOSIS — I10 Essential (primary) hypertension: Secondary | ICD-10-CM | POA: Diagnosis not present

## 2019-12-04 DIAGNOSIS — F419 Anxiety disorder, unspecified: Secondary | ICD-10-CM | POA: Diagnosis not present

## 2019-12-04 DIAGNOSIS — Z90710 Acquired absence of both cervix and uterus: Secondary | ICD-10-CM | POA: Diagnosis not present

## 2019-12-04 DIAGNOSIS — M1990 Unspecified osteoarthritis, unspecified site: Secondary | ICD-10-CM | POA: Diagnosis not present

## 2019-12-04 DIAGNOSIS — J45909 Unspecified asthma, uncomplicated: Secondary | ICD-10-CM | POA: Diagnosis not present

## 2019-12-04 HISTORY — PX: CATARACT EXTRACTION W/PHACO: SHX586

## 2019-12-04 SURGERY — PHACOEMULSIFICATION, CATARACT, WITH IOL INSERTION
Anesthesia: Monitor Anesthesia Care | Site: Eye | Laterality: Left

## 2019-12-04 MED ORDER — LIDOCAINE HCL 3.5 % OP GEL
1.0000 "application " | Freq: Once | OPHTHALMIC | Status: AC
  Administered 2019-12-04: 1 via OPHTHALMIC

## 2019-12-04 MED ORDER — CYCLOPENTOLATE-PHENYLEPHRINE 0.2-1 % OP SOLN
1.0000 [drp] | OPHTHALMIC | Status: AC | PRN
  Administered 2019-12-04 (×3): 1 [drp] via OPHTHALMIC

## 2019-12-04 MED ORDER — PROVISC 10 MG/ML IO SOLN
INTRAOCULAR | Status: DC | PRN
  Administered 2019-12-04: 0.85 mL via INTRAOCULAR

## 2019-12-04 MED ORDER — MIDAZOLAM HCL 2 MG/2ML IJ SOLN
INTRAMUSCULAR | Status: AC
  Filled 2019-12-04: qty 2

## 2019-12-04 MED ORDER — BSS IO SOLN
INTRAOCULAR | Status: DC | PRN
  Administered 2019-12-04: 15 mL via INTRAOCULAR

## 2019-12-04 MED ORDER — EPINEPHRINE PF 1 MG/ML IJ SOLN
INTRAOCULAR | Status: DC | PRN
  Administered 2019-12-04: 500 mL

## 2019-12-04 MED ORDER — LIDOCAINE HCL (PF) 1 % IJ SOLN
INTRAOCULAR | Status: DC | PRN
  Administered 2019-12-04: 1 mL via OPHTHALMIC

## 2019-12-04 MED ORDER — TETRACAINE HCL 0.5 % OP SOLN
1.0000 [drp] | OPHTHALMIC | Status: AC | PRN
  Administered 2019-12-04 (×3): 1 [drp] via OPHTHALMIC

## 2019-12-04 MED ORDER — SODIUM HYALURONATE 23 MG/ML IO SOLN
INTRAOCULAR | Status: DC | PRN
  Administered 2019-12-04: 0.6 mL via INTRAOCULAR

## 2019-12-04 MED ORDER — LACTATED RINGERS IV SOLN: INTRAVENOUS | Status: DC

## 2019-12-04 MED ORDER — MIDAZOLAM HCL 2 MG/2ML IJ SOLN
INTRAMUSCULAR | Status: DC | PRN
  Administered 2019-12-04 (×2): 1 mg via INTRAVENOUS

## 2019-12-04 MED ORDER — POVIDONE-IODINE 5 % OP SOLN
OPHTHALMIC | Status: DC | PRN
  Administered 2019-12-04: 1 via OPHTHALMIC

## 2019-12-04 MED ORDER — EPINEPHRINE PF 1 MG/ML IJ SOLN
INTRAMUSCULAR | Status: AC
  Filled 2019-12-04: qty 2

## 2019-12-04 MED ORDER — PHENYLEPHRINE HCL 2.5 % OP SOLN
1.0000 [drp] | OPHTHALMIC | Status: AC | PRN
  Administered 2019-12-04 (×3): 1 [drp] via OPHTHALMIC

## 2019-12-04 SURGICAL SUPPLY — 17 items
CLOTH BEACON ORANGE TIMEOUT ST (SAFETY) ×2 IMPLANT
EYE SHIELD UNIVERSAL CLEAR (GAUZE/BANDAGES/DRESSINGS) ×2 IMPLANT
GLOVE BIOGEL PI IND STRL 6.5 (GLOVE) IMPLANT
GLOVE BIOGEL PI IND STRL 7.0 (GLOVE) IMPLANT
GLOVE BIOGEL PI INDICATOR 6.5 (GLOVE) ×2
GLOVE BIOGEL PI INDICATOR 7.0 (GLOVE) ×4
GLOVE SURG SS PI 7.5 STRL IVOR (GLOVE) ×2 IMPLANT
GOWN STRL REUS W/TWL LRG LVL3 (GOWN DISPOSABLE) ×2 IMPLANT
LENS ALC ACRYL/TECN (Ophthalmic Related) ×2 IMPLANT
NDL HYPO 18GX1.5 BLUNT FILL (NEEDLE) IMPLANT
NEEDLE HYPO 18GX1.5 BLUNT FILL (NEEDLE) ×3 IMPLANT
PAD ARMBOARD 7.5X6 YLW CONV (MISCELLANEOUS) ×2 IMPLANT
SYR TB 1ML LL NO SAFETY (SYRINGE) ×2 IMPLANT
TAPE SURG TRANSPORE 1 IN (GAUZE/BANDAGES/DRESSINGS) IMPLANT
TAPE SURGICAL TRANSPORE 1 IN (GAUZE/BANDAGES/DRESSINGS) ×3
VISCOELASTIC ADDITIONAL (OPHTHALMIC RELATED) ×2 IMPLANT
WATER STERILE IRR 250ML POUR (IV SOLUTION) ×2 IMPLANT

## 2019-12-04 NOTE — Anesthesia Preprocedure Evaluation (Addendum)
Anesthesia Evaluation  Patient identified by MRN, date of birth, ID band Patient awake    Reviewed: Allergy & Precautions, NPO status , Patient's Chart, lab work & pertinent test results  History of Anesthesia Complications Negative for: history of anesthetic complications  Airway Mallampati: III  TM Distance: >3 FB Neck ROM: Full    Dental  (+) Teeth Intact   Pulmonary asthma , former smoker,    Pulmonary exam normal breath sounds clear to auscultation       Cardiovascular METS: 3 - Mets hypertension, Pt. on medications Normal cardiovascular exam Rhythm:Regular Rate:Normal     Neuro/Psych Anxiety    GI/Hepatic Neg liver ROS, GERD  Medicated and Controlled,  Endo/Other  negative endocrine ROS  Renal/GU negative Renal ROS     Musculoskeletal  (+) Arthritis , Osteoarthritis,    Abdominal   Peds  Hematology negative hematology ROS (+)   Anesthesia Other Findings   Reproductive/Obstetrics negative OB ROS                             Anesthesia Physical  Anesthesia Plan  ASA: II  Anesthesia Plan: MAC   Post-op Pain Management:    Induction:   PONV Risk Score and Plan:   Airway Management Planned: Nasal Cannula and Natural Airway  Additional Equipment:   Intra-op Plan:   Post-operative Plan:   Informed Consent: I have reviewed the patients History and Physical, chart, labs and discussed the procedure including the risks, benefits and alternatives for the proposed anesthesia with the patient or authorized representative who has indicated his/her understanding and acceptance.     Dental advisory given  Plan Discussed with: CRNA and Surgeon  Anesthesia Plan Comments:         Anesthesia Quick Evaluation

## 2019-12-04 NOTE — Interval H&P Note (Signed)
History and Physical Interval Note:  12/04/2019 9:51 AM  Kendra Orozco  has presented today for surgery, with the diagnosis of Nuclear sclerotic cataract - Left eye.  The various methods of treatment have been discussed with the patient and family. After consideration of risks, benefits and other options for treatment, the patient has consented to  Procedure(s) with comments: CATARACT EXTRACTION PHACO AND INTRAOCULAR LENS PLACEMENT ([place]) (Left) - left as a surgical intervention.  The patient's history has been reviewed, patient examined, no change in status, stable for surgery.  I have reviewed the patient's chart and labs.  Questions were answered to the patient's satisfaction.     Baruch Goldmann

## 2019-12-04 NOTE — Transfer of Care (Signed)
Immediate Anesthesia Transfer of Care Note  Patient: Kendra Orozco  Procedure(s) Performed: CATARACT EXTRACTION PHACO AND INTRAOCULAR LENS PLACEMENT LEFT EYE (Left Eye)  Patient Location: PACU  Anesthesia Type:MAC  Level of Consciousness: awake, alert , oriented and patient cooperative  Airway & Oxygen Therapy: Patient Spontanous Breathing  Post-op Assessment: Report given to RN and Post -op Vital signs reviewed and stable  Post vital signs: Reviewed and stable  Last Vitals:  Vitals Value Taken Time  BP 116/72 1020  Temp    Pulse 70   Resp    SpO2 99     Last Pain:  Vitals:   12/04/19 0903  TempSrc: Oral  PainSc: 0-No pain         Complications: No complications documented.

## 2019-12-04 NOTE — Discharge Instructions (Signed)
Please discharge patient when stable, will follow up today with Dr.  at the [facility] [place] office immediately following discharge.  Leave shield in place until visit.  All paperwork with discharge instructions will be given at the office.  [facility] Address:  [address]  [place], [place] [address]             Monitored Anesthesia Care, Care After These instructions provide you with information about caring for yourself after your procedure. Your health care provider may also give you more specific instructions. Your treatment has been planned according to current medical practices, but problems sometimes occur. Call your health care provider if you have any problems or questions after your procedure. What can I expect after the procedure? After your procedure, you may:  Feel sleepy for several hours.  Feel clumsy and have poor balance for several hours.  Feel forgetful about what happened after the procedure.  Have poor judgment for several hours.  Feel nauseous or vomit.  Have a sore throat if you had a breathing tube during the procedure. Follow these instructions at home: For at least 24 hours after the procedure:      Have a responsible adult stay with you. It is important to have someone help care for you until you are awake and alert.  Rest as needed.  Do not: ? Participate in activities in which you could fall or become injured. ? Drive. ? Use heavy machinery. ? Drink alcohol. ? Take sleeping pills or medicines that cause drowsiness. ? Make important decisions or sign legal documents. ? Take care of children on your own. Eating and drinking  Follow the diet that is recommended by your health care provider.  If you vomit, drink water, juice, or soup when you can drink without vomiting.  Make sure you have little or no nausea before eating solid foods. General instructions  Take over-the-counter and  prescription medicines only as told by your health care provider.  If you have sleep apnea, surgery and certain medicines can increase your risk for breathing problems. Follow instructions from your health care provider about wearing your sleep device: ? Anytime you are sleeping, including during daytime naps. ? While taking prescription pain medicines, sleeping medicines, or medicines that make you drowsy.  If you smoke, do not smoke without supervision.  Keep all follow-up visits as told by your health care provider. This is important. Contact a health care provider if:  You keep feeling nauseous or you keep vomiting.  You feel light-headed.  You develop a rash.  You have a fever. Get help right away if:  You have trouble breathing. Summary  For several hours after your procedure, you may feel sleepy and have poor judgment.  Have a responsible adult stay with you for at least 24 hours or until you are awake and alert. This information is not intended to replace advice given to you by your health care provider. Make sure you discuss any questions you have with your health care provider. Document Revised: 07/01/2017 Document Reviewed: 07/24/2015 Elsevier Patient Education  2020 Elsevier Inc.  

## 2019-12-04 NOTE — Anesthesia Postprocedure Evaluation (Signed)
Anesthesia Post Note  Patient: Kendra Orozco  Procedure(s) Performed: CATARACT EXTRACTION PHACO AND INTRAOCULAR LENS PLACEMENT LEFT EYE (Left Eye)  Patient location during evaluation: Phase II Anesthesia Type: MAC Level of consciousness: awake, awake and alert, oriented and patient cooperative Pain management: pain level controlled Vital Signs Assessment: post-procedure vital signs reviewed and stable Respiratory status: spontaneous breathing, respiratory function stable and nonlabored ventilation Cardiovascular status: blood pressure returned to baseline and stable Postop Assessment: no apparent nausea or vomiting Anesthetic complications: no   No complications documented.   Last Vitals:  Vitals:   12/04/19 0903  BP: 99/67  Pulse: 83  Resp: 19  Temp: 36.6 C  SpO2: 99%    Last Pain:  Vitals:   12/04/19 0903  TempSrc: Oral  PainSc: 0-No pain                 , L

## 2019-12-04 NOTE — Op Note (Signed)
Date of procedure: 12/04/19  Pre-operative diagnosis: Visually significant age-related combined cataract, Left Eye (H25.812)  Post-operative diagnosis: Visually significant age-related combined cataract, Left Eye (H25.812)  Procedure: Removal of cataract via phacoemulsification and insertion of intra-ocular lens Wynetta Emery and Melfa  +27.5D into the capsular bag of the Left Eye  Attending surgeon: Gerda Diss. , MD, MA  Anesthesia: MAC, Topical [place]  Complications: None  Estimated Blood Loss: <51m (minimal)  Specimens: None  Implants: As above  Indications:  Visually significant age-related cataract, Left Eye  Procedure:  The patient was seen and identified in the pre-operative area. The operative eye was identified and dilated.  The operative eye was marked.  Topical anesthesia was administered to the operative eye.     The patient was then to the operative suite and placed in the supine position.  A timeout was performed confirming the patient, procedure to be performed, and all other relevant information.   The patient's face was prepped and draped in the usual fashion for intra-ocular surgery.  A lid speculum was placed into the operative eye and the surgical microscope moved into place and focused.  An inferotemporal paracentesis was created using a 20 gauge paracentesis blade.  Shugarcaine was injected into the anterior chamber.  Viscoelastic was injected into the anterior chamber.  A temporal clear-corneal main wound incision was created using a 2.419mmicrokeratome.  A continuous curvilinear capsulorrhexis was initiated using an irrigating cystitome and completed using capsulorrhexis forceps.  Hydrodissection and hydrodeliniation were performed.  Viscoelastic was injected into the anterior chamber.  A phacoemulsification handpiece and a chopper as a second instrument were used to remove the nucleus and epinucleus. The irrigation/aspiration handpiece was used to remove  any remaining cortical material.   The capsular bag was reinflated with viscoelastic, checked, and found to be intact.  The intraocular lens was inserted into the capsular bag.  The irrigation/aspiration handpiece was used to remove any remaining viscoelastic.  The clear corneal wound and paracentesis wounds were then hydrated and checked with Weck-Cels to be watertight.  The lid-speculum was removed.  The drape was removed.  The patient's face was cleaned with a wet and dry 4x4.  A clear shield was taped over the eye. The patient was taken to the post-operative care unit in good condition, having tolerated the procedure well.  Post-Op Instructions: [facility] and will receive all other orders and instructions.

## 2019-12-07 ENCOUNTER — Encounter (HOSPITAL_COMMUNITY): Payer: Self-pay | Admitting: Ophthalmology

## 2020-01-06 DIAGNOSIS — M25561 Pain in right knee: Secondary | ICD-10-CM | POA: Diagnosis not present

## 2020-01-06 DIAGNOSIS — Z0000 Encounter for general adult medical examination without abnormal findings: Secondary | ICD-10-CM | POA: Diagnosis not present

## 2020-01-06 DIAGNOSIS — G4700 Insomnia, unspecified: Secondary | ICD-10-CM | POA: Diagnosis not present

## 2020-01-06 DIAGNOSIS — Z6829 Body mass index (BMI) 29.0-29.9, adult: Secondary | ICD-10-CM | POA: Diagnosis not present

## 2020-01-06 DIAGNOSIS — M25511 Pain in right shoulder: Secondary | ICD-10-CM | POA: Diagnosis not present

## 2020-01-06 DIAGNOSIS — R05 Cough: Secondary | ICD-10-CM | POA: Diagnosis not present

## 2020-01-06 DIAGNOSIS — K649 Unspecified hemorrhoids: Secondary | ICD-10-CM | POA: Diagnosis not present

## 2020-01-06 DIAGNOSIS — L0201 Cutaneous abscess of face: Secondary | ICD-10-CM | POA: Diagnosis not present

## 2020-01-06 DIAGNOSIS — J209 Acute bronchitis, unspecified: Secondary | ICD-10-CM | POA: Diagnosis not present

## 2020-01-06 DIAGNOSIS — Z6830 Body mass index (BMI) 30.0-30.9, adult: Secondary | ICD-10-CM | POA: Diagnosis not present

## 2020-01-06 DIAGNOSIS — M1711 Unilateral primary osteoarthritis, right knee: Secondary | ICD-10-CM | POA: Diagnosis not present

## 2020-01-06 DIAGNOSIS — R062 Wheezing: Secondary | ICD-10-CM | POA: Diagnosis not present

## 2020-01-12 DIAGNOSIS — I10 Essential (primary) hypertension: Secondary | ICD-10-CM | POA: Diagnosis not present

## 2020-01-12 DIAGNOSIS — K219 Gastro-esophageal reflux disease without esophagitis: Secondary | ICD-10-CM | POA: Diagnosis not present

## 2020-01-12 DIAGNOSIS — E669 Obesity, unspecified: Secondary | ICD-10-CM | POA: Diagnosis not present

## 2020-01-12 DIAGNOSIS — F411 Generalized anxiety disorder: Secondary | ICD-10-CM | POA: Diagnosis not present

## 2020-01-12 DIAGNOSIS — J4520 Mild intermittent asthma, uncomplicated: Secondary | ICD-10-CM | POA: Diagnosis not present

## 2020-01-12 DIAGNOSIS — K649 Unspecified hemorrhoids: Secondary | ICD-10-CM | POA: Diagnosis not present

## 2020-01-12 DIAGNOSIS — G4700 Insomnia, unspecified: Secondary | ICD-10-CM | POA: Diagnosis not present

## 2020-01-12 DIAGNOSIS — M545 Low back pain: Secondary | ICD-10-CM | POA: Diagnosis not present

## 2020-01-12 DIAGNOSIS — J309 Allergic rhinitis, unspecified: Secondary | ICD-10-CM | POA: Diagnosis not present

## 2020-01-12 DIAGNOSIS — E782 Mixed hyperlipidemia: Secondary | ICD-10-CM | POA: Diagnosis not present

## 2020-01-12 DIAGNOSIS — K588 Other irritable bowel syndrome: Secondary | ICD-10-CM | POA: Diagnosis not present

## 2020-01-12 DIAGNOSIS — R351 Nocturia: Secondary | ICD-10-CM | POA: Diagnosis not present

## 2020-01-19 DIAGNOSIS — J309 Allergic rhinitis, unspecified: Secondary | ICD-10-CM | POA: Diagnosis not present

## 2020-01-19 DIAGNOSIS — F411 Generalized anxiety disorder: Secondary | ICD-10-CM | POA: Diagnosis not present

## 2020-01-19 DIAGNOSIS — E782 Mixed hyperlipidemia: Secondary | ICD-10-CM | POA: Diagnosis not present

## 2020-01-19 DIAGNOSIS — K588 Other irritable bowel syndrome: Secondary | ICD-10-CM | POA: Diagnosis not present

## 2020-01-19 DIAGNOSIS — J4520 Mild intermittent asthma, uncomplicated: Secondary | ICD-10-CM | POA: Diagnosis not present

## 2020-01-19 DIAGNOSIS — K649 Unspecified hemorrhoids: Secondary | ICD-10-CM | POA: Diagnosis not present

## 2020-01-19 DIAGNOSIS — E669 Obesity, unspecified: Secondary | ICD-10-CM | POA: Diagnosis not present

## 2020-01-19 DIAGNOSIS — R351 Nocturia: Secondary | ICD-10-CM | POA: Diagnosis not present

## 2020-01-19 DIAGNOSIS — I10 Essential (primary) hypertension: Secondary | ICD-10-CM | POA: Diagnosis not present

## 2020-01-19 DIAGNOSIS — G4700 Insomnia, unspecified: Secondary | ICD-10-CM | POA: Diagnosis not present

## 2020-01-19 DIAGNOSIS — K219 Gastro-esophageal reflux disease without esophagitis: Secondary | ICD-10-CM | POA: Diagnosis not present

## 2020-01-25 DIAGNOSIS — M546 Pain in thoracic spine: Secondary | ICD-10-CM | POA: Diagnosis not present

## 2020-01-25 DIAGNOSIS — M542 Cervicalgia: Secondary | ICD-10-CM | POA: Diagnosis not present

## 2020-01-25 DIAGNOSIS — M9903 Segmental and somatic dysfunction of lumbar region: Secondary | ICD-10-CM | POA: Diagnosis not present

## 2020-01-25 DIAGNOSIS — M9902 Segmental and somatic dysfunction of thoracic region: Secondary | ICD-10-CM | POA: Diagnosis not present

## 2020-01-25 DIAGNOSIS — M9901 Segmental and somatic dysfunction of cervical region: Secondary | ICD-10-CM | POA: Diagnosis not present

## 2020-03-04 DIAGNOSIS — L02224 Furuncle of groin: Secondary | ICD-10-CM | POA: Diagnosis not present

## 2020-03-09 ENCOUNTER — Ambulatory Visit: Payer: PPO | Admitting: Allergy & Immunology

## 2020-03-16 ENCOUNTER — Other Ambulatory Visit: Payer: Self-pay

## 2020-03-16 ENCOUNTER — Encounter: Payer: Self-pay | Admitting: Allergy & Immunology

## 2020-03-16 ENCOUNTER — Ambulatory Visit (INDEPENDENT_AMBULATORY_CARE_PROVIDER_SITE_OTHER): Payer: PPO | Admitting: Allergy & Immunology

## 2020-03-16 VITALS — BP 130/70 | HR 100 | Temp 98.0°F | Resp 18 | Ht 63.0 in | Wt 206.0 lb

## 2020-03-16 DIAGNOSIS — K9049 Malabsorption due to intolerance, not elsewhere classified: Secondary | ICD-10-CM | POA: Diagnosis not present

## 2020-03-16 DIAGNOSIS — J310 Chronic rhinitis: Secondary | ICD-10-CM

## 2020-03-16 DIAGNOSIS — R21 Rash and other nonspecific skin eruption: Secondary | ICD-10-CM | POA: Diagnosis not present

## 2020-03-16 DIAGNOSIS — J4520 Mild intermittent asthma, uncomplicated: Secondary | ICD-10-CM

## 2020-03-16 NOTE — Patient Instructions (Addendum)
1. Moderate persistent asthma, uncomplicated - Lung testing looks good today. - I think you are doing very well with managing this. - I think that you need something EVERY DAY to make sure that your symptoms are better controlled. - Stop the Breo and start Symbicort 160/4.5 one puff twice daily WITH SPACER.  - We do need to start using a spacer for better delivery of the albuterol.  - Spacer sample and demonstration provided. - Daily controller medication(s): Symbicort 160/4.45mcg one puff twice daily with spacer - Prior to physical activity: albuterol 2 puffs 10-15 minutes before physical activity. - Rescue medications: albuterol 4 puffs every 4-6 hours as needed - Asthma control goals:  * Full participation in all desired activities (may need albuterol before activity) * Albuterol use two time or less a week on average (not counting use with activity) * Cough interfering with sleep two time or less a month * Oral steroids no more than once a year * No hospitalizations  2. Chronic rhinitis - Testing today showed: negative to the entire panel  - Copy of test results provided.  - Stop taking:  - Continue with: Flonase (fluticasone) one spray per nostril daily - Start taking:  - You can use an extra dose of the antihistamine, if needed, for breakthrough symptoms.  - Consider nasal saline rinses 1-2 times daily to remove allergens from the nasal cavities as well as help with mucous clearance (this is especially helpful to do before the nasal sprays are given) - Consider allergy shots as a means of long-term control. - Allergy shots "re-train" and "reset" the immune system to ignore environmental allergens and decrease the resulting immune response to those allergens (sneezing, itchy watery eyes, runny nose, nasal congestion, etc).    - Allergy shots improve symptoms in 75-85% of patients.  - We can discuss more at the next appointment if the medications are not working for you.  3. Food  intolerance - Testing was negative to everything we tested for. - Copy of test results provided. - There is a the low positive predictive value of food allergy testing and hence the high possibility of false positives. - In contrast, food allergy testing has a high negative predictive value, therefore if testing is negative we can be relatively assured that they are indeed negative. I would cont - There is no need for an epinephrine autoinjector.   4. Rash - Continue to follow with Dr. Modena Nunnery.   5. Return in about 6 weeks (around 04/27/2020).   Please inform us of any Emergency Department visits, hospitalizations, or changes in symptoms. Call us before going to the ED for breathing or allergy symptoms since we might be able to fit you in for a sick visit. Feel free to contact us anytime with any questions, problems, or concerns.  It was a pleasure to meet you today!  Websites that have reliable patient information: 1. American Academy of Asthma, Allergy, and Immunology: www.aaaai.org 2. Food Allergy Research and Education (FARE): foodallergy.org 3. Mothers of Asthmatics: http://www.asthmacommunitynetwork.org 4. American College of Allergy, Asthma, and Immunology: www.acaai.org   COVID-19 Vaccine Information can be found at: [email or web address] For questions related to vaccine distribution or appointments, please email [email or web address] or call [phone].     "Like" [place] on Facebook and Instagram for our latest updates!     HAPPY FALL!     Make sure you are registered to vote! If you have moved or changed any of your contact information, you will need to get  this updated before voting!  In some cases, you MAY be able to register to vote online: [email or web address]

## 2020-03-16 NOTE — Progress Notes (Signed)
NEW PATIENT  Date of Service/Encounter:  03/16/20  Referring provider: Celene Squibb, MD   Assessment:   Mild intermittent asthma, uncomplicated  Chronic non-allergic rhinitis  Food intolerance  Rash  Plan/Recommendations:   1. Moderate persistent asthma, uncomplicated - Lung testing looks good today. - I think you are doing very well with managing this. - I think that you need something EVERY DAY to make sure that your symptoms are better controlled. - Stop the Breo and start Symbicort 160/4.5 one puff twice daily WITH SPACER.  - We do need to start using a spacer for better delivery of the albuterol.  - Spacer sample and demonstration provided. - Daily controller medication(s): Symbicort 160/4.38mcg one puff twice daily with spacer - Prior to physical activity: albuterol 2 puffs 10-15 minutes before physical activity. - Rescue medications: albuterol 4 puffs every 4-6 hours as needed - Asthma control goals:  * Full participation in all desired activities (may need albuterol before activity) * Albuterol use two time or less a week on average (not counting use with activity) * Cough interfering with sleep two time or less a month * Oral steroids no more than once a year * No hospitalizations  2. Chronic rhinitis - Testing today showed: negative to the entire panel  - Copy of test results provided.  - Stop taking:  - Continue with: Flonase (fluticasone) one spray per nostril daily - Start taking:  - You can use an extra dose of the antihistamine, if needed, for breakthrough symptoms.  - Consider nasal saline rinses 1-2 times daily to remove allergens from the nasal cavities as well as help with mucous clearance (this is especially helpful to do before the nasal sprays are given) - Consider allergy shots as a means of long-term control. - Allergy shots "re-train" and "reset" the immune system to ignore environmental allergens and decrease the resulting immune response to  those allergens (sneezing, itchy watery eyes, runny nose, nasal congestion, etc).    - Allergy shots improve symptoms in 75-85% of patients.  - We can discuss more at the next appointment if the medications are not working for you.  3. Food intolerance - Testing was negative to everything we tested for.  - Copy of test results provided. - There is a the low positive predictive value of food allergy testing and hence the high possibility of false positives. - In contrast, food allergy testing has a high negative predictive value, therefore if testing is negative we can be relatively assured that they are indeed negative. I would cont - There is no need for an epinephrine autoinjector.   4. Rash - Continue to follow with Dr. Modena Nunnery.   5. Return in about 6 weeks (around 04/27/2020).    Subjective:   Kendra Orozco is a 74 y.o. female presenting today for evaluation of  Chief Complaint  Patient presents with  . Allergies    mainly respiratory symptoms. does have gerd, which is exacerbated by her allergies. Benadryl as needed and Xyzal every night.      Kendra Orozco has a history of the following: Patient Active Problem List   Diagnosis Date Noted  . Constipation 04/15/2013  . Rectal bleeding 02/11/2013  . GERD (gastroesophageal reflux disease) 02/11/2013    History obtained from: chart review and patient.  Kendra Orozco was referred by Celene Squibb, MD.     Kendra Orozco is a 74 y.o. female presenting for an evaluation of asthma and allergies.  Asthma/Respiratory Symptom History: She has a history of mild asthma. She has ProAir that she uses as needed. She has multiple triggers including environmental allergens as well as perfumes and corn. Seasonal changes are the worst.   Allergic Rhinitis Symptom History: She has allergic rhinitis. She has had allergies since she was little. She was on shots in the 1960s. She is on levocetirizine and her inhalers when her allergies are  active. She does not use any nose sprays at all. She was on shots for three years. These did help considerably. She needs to be treated every three months for "some kind of breathing problem".   Food Allergy Symptom History: She thinks that she has a lot of "new food allergies". She tells me that this "tears" her stomach up. There are certain foods that bother her GI system. She tolerates all the major food allergens without adverse event. She sees Dr. Gala Romney. She is on AciPhex and Amitiza.    Urticaria Symptom History: She has a history of urticaria on her left lower abdomen and inner thighs. She has a history of skin cancer and she had some removed somewhat recently. AFter this, she has some breakouts. She has been hyaving itching. The main rash is NOT elevated but it is very red. This rash has been there for a couple of weeks. Overall she has had rashes "all of her life". She has never been diagnosed with eczema. She sees Dr. Modena Nunnery and was on doxycycline for a period of time.   Otherwise, there is no history of other atopic diseases, including food allergies, drug allergies, stinging insect allergies, eczema, urticaria or contact dermatitis. There is no significant infectious history. Vaccinations are up to date.    Past Medical History: Patient Active Problem List   Diagnosis Date Noted  . Constipation 04/15/2013  . Rectal bleeding 02/11/2013  . GERD (gastroesophageal reflux disease) 02/11/2013    Medication List:  Allergies as of 03/16/2020      Reactions   Pantoprazole Sodium    Constipation, GI pain   Latex Rash   Sulfa Antibiotics Swelling      Medication List       Accurate as of March 16, 2020 11:59 PM. If you have any questions, ask your nurse or doctor.        albuterol 108 (90 Base) MCG/ACT inhaler Commonly known as: VENTOLIN HFA Inhale 2 puffs into the lungs every 4 (four) hours as needed for wheezing or shortness of breath.   Benefiber Powd Take 1 Package by  mouth in the morning and at bedtime. 1 packet=1 tbsp   Breo Ellipta 100-25 MCG/INH Aepb Generic drug: fluticasone furoate-vilanterol Inhale 1 puff into the lungs daily.   budesonide-formoterol 160-4.5 MCG/ACT inhaler Commonly known as: Symbicort Inhale 2 puffs into the lungs 2 (two) times daily. Started by: Valentina Shaggy, MD   calcium carbonate 1250 (500 Ca) MG chewable tablet Commonly known as: OS-CAL Chew 1 tablet by mouth 2 (two) times a week.   [employer] Vit D 5000 High-Potency 125 MCG (5000 UT) capsule Generic drug: Cholecalciferol Take 5,000 Units by mouth daily.   diphenhydrAMINE 25 mg capsule Commonly known as: BENADRYL Take 25 mg by mouth daily as needed for allergies.   docusate sodium 250 MG capsule Commonly known as: COLACE Take 250 mg by mouth daily as needed for constipation.   hydrochlorothiazide 25 MG tablet Commonly known as: HYDRODIURIL Take 25 mg by mouth daily.   ibuprofen 200 MG tablet Commonly known as:  ADVIL Take 200-400 mg by mouth daily as needed.   levocetirizine 5 MG tablet Commonly known as: XYZAL Take 5 mg by mouth at bedtime.   LORazepam 0.5 MG tablet Commonly known as: ATIVAN Take 0.5 mg by mouth 2 (two) times daily as needed for anxiety.   lubiprostone 24 MCG capsule Commonly known as: Amitiza TAKE ONE CAPSULE BY MOUTH TWICE DAILY WITH A MEAL. What changed:   how much to take  how to take this  when to take this   meloxicam 15 MG tablet Commonly known as: MOBIC Take 15 mg by mouth daily as needed for pain.   methocarbamol 500 MG tablet Commonly known as: ROBAXIN Take 500 mg by mouth every 8 (eight) hours as needed for muscle spasms.   multivitamin with minerals Tabs tablet Take 1 tablet by mouth daily.   Muscle Rub 10-15 % Crea Apply 1 application topically as needed for muscle pain.   olmesartan 40 MG tablet Commonly known as: BENICAR Take 40 mg by mouth every morning.   RABEprazole 20 MG tablet Commonly known  as: ACIPHEX Take 1 tablet (20 mg total) by mouth 2 (two) times daily before a meal.   REFRESH OP Apply to eye as needed.       Birth History: non-contributory  Developmental History: non-contributory  Past Surgical History: Past Surgical History:  Procedure Laterality Date  . ABDOMINAL HYSTERECTOMY     fibroid tumors  . APPENDECTOMY    . BACK SURGERY    . BREAST CYST EXCISION Right   . CATARACT EXTRACTION W/PHACO Right 11/20/2019   Procedure: CATARACT EXTRACTION PHACO AND INTRAOCULAR LENS PLACEMENT ([place]) RIGHT EYE;  Surgeon: Baruch Goldmann, MD;  Location: [facility];  Service: Ophthalmology;  Laterality: Right;  CDE: 17.30  . CATARACT EXTRACTION W/PHACO Left 12/04/2019   Procedure: CATARACT EXTRACTION PHACO AND INTRAOCULAR LENS PLACEMENT LEFT EYE;  Surgeon: Baruch Goldmann, MD;  Location: [facility];  Service: Ophthalmology;  Laterality: Left;  CDE: 14.59  . COLONOSCOPY  09/16/2007   BWI:OMBT canal hemorrhoids, otherwise normal rectum left-sided diverticula and colonic mucosa appeared normal.  . COLONOSCOPY N/A 03/02/2013   Dr. Chauncy Lean adenoma. Colonic diverticulosis. Anal canal hemorrhoids-likely source of hematochezia. Surveillance 2019  . COLONOSCOPY WITH PROPOFOL N/A 07/29/2017   Non-bleeding internal hemorrhoids, cecal AVMs.   . ESOPHAGOGASTRODUODENOSCOPY  09/16/2007   DHR:CBULAGTXMIW undulating Z-line, tiny distal esophageal  erosions consistent with mild erosive reflux esophagitis, patulous EG junction, small hiatal hernia, otherwise normal stomach, D1, and D2.  . HEMORRHOID SURGERY     X 2      Family History: Family History  Problem Relation Age of Onset  . Colon cancer Brother        36, deceased  . Breast cancer Sister 50  . Breast cancer Maternal Aunt 28  . Heart attack Mother   . Allergic rhinitis Mother   . Allergic rhinitis Father      Social History: Lucresia lives at home with her family. She lives in a trailer that is 74 years old. There is carpet and  linoleum throughout the home. She has a heat pump for heating and cooling. There are no animals inside or outside of the home. She does not have dust mite covers on the bedding. There is no tobacco exposure. She is not exposed to chemicals, fumes, or dust.   Review of Systems  Constitutional: Negative.  Negative for fever, malaise/fatigue and weight loss.  HENT: Positive for congestion. Negative for ear discharge and ear pain.  Positive for postnasal drip.  Eyes: Negative for pain, discharge and redness.  Respiratory: Positive for cough. Negative for sputum production, shortness of breath and wheezing.   Cardiovascular: Negative.  Negative for chest pain and palpitations.  Gastrointestinal: Negative for abdominal pain, heartburn, nausea and vomiting.  Skin: Negative.  Negative for itching and rash.  Neurological: Negative for dizziness and headaches.  Endo/Heme/Allergies: Negative for environmental allergies. Does not bruise/bleed easily.       Objective:   Blood pressure 130/70, pulse 100, temperature 98 F (36.7 C), temperature source Temporal, resp. rate 18, height 5\' 3"  (1.6 m), weight 206 lb (93.4 kg), SpO2 99 %. Body mass index is 36.49 kg/m.   Physical Exam:   Physical Exam Constitutional:      Appearance: She is well-developed.     Comments: Pleasant female.  Cooperative with the exam.  Talkative.  HENT:     Head: Normocephalic and atraumatic.     Right Ear: Tympanic membrane, ear canal and external ear normal. No drainage, swelling or tenderness. Tympanic membrane is not injected, scarred, erythematous, retracted or bulging.     Left Ear: Tympanic membrane, ear canal and external ear normal. No drainage, swelling or tenderness. Tympanic membrane is not injected, scarred, erythematous, retracted or bulging.     Nose: No nasal deformity, septal deviation, mucosal edema or rhinorrhea.     Right Turbinates: Enlarged and swollen.     Left Turbinates: Enlarged and  swollen.     Right Sinus: No maxillary sinus tenderness or frontal sinus tenderness.     Left Sinus: No maxillary sinus tenderness or frontal sinus tenderness.     Mouth/Throat:     Mouth: Mucous membranes are not pale and not dry.     Pharynx: Uvula midline.  Eyes:     General:        Right eye: No discharge.        Left eye: No discharge.     Conjunctiva/sclera: Conjunctivae normal.     Right eye: Right conjunctiva is not injected. No chemosis.    Left eye: Left conjunctiva is not injected. No chemosis.    Pupils: Pupils are equal, round, and reactive to light.  Cardiovascular:     Rate and Rhythm: Normal rate and regular rhythm.     Heart sounds: Normal heart sounds.  Pulmonary:     Effort: Pulmonary effort is normal. No tachypnea, accessory muscle usage or respiratory distress.     Breath sounds: Normal breath sounds. No wheezing, rhonchi or rales.     Comments: Moving air well in all lung fields. No increased work of breathing noted.  Chest:     Chest wall: No tenderness.  Abdominal:     Tenderness: There is no abdominal tenderness. There is no guarding or rebound.  Lymphadenopathy:     Head:     Right side of head: No submandibular, tonsillar or occipital adenopathy.     Left side of head: No submandibular, tonsillar or occipital adenopathy.     Cervical: No cervical adenopathy.  Skin:    Coloration: Skin is not pale.     Findings: No abrasion, erythema, petechiae or rash. Rash is not papular, urticarial or vesicular.     Comments: No eczematous or urticaria lesions noted.   Neurological:     Mental Status: She is alert.      Diagnostic studies:    Spirometry: results normal (FEV1: 1.62/79%, FVC: 2.00/73%, FEV1/FVC: 81%).    Spirometry consistent with normal pattern.  Allergy Studies:     Airborne Adult Perc - 03/16/20 1500    Time Antigen Placed 9371    Allergen Manufacturer Lavella Hammock    Location Back    Number of Test 59    1. Control-Buffer 50% Glycerol  Negative    2. Control-Histamine 1 mg/ml 2+    3. Albumin saline Negative    4. [place] Negative    5. [place] Negative    6. Johnson Negative    7. [place] Blue Negative    8. Meadow Fescue Negative    9. Perennial Rye Negative    10. Sweet Vernal Negative    11. Timothy Negative    12. Cocklebur Negative    13. Burweed Marshelder Negative    14. Ragweed, short Negative    15. Ragweed, Giant Negative    16. Plantain,  English Negative    17. Lamb's Quarters Negative    18. Sheep Sorrell Negative    19. Rough Pigweed Negative    20. Marsh Elder, Rough Negative    21. Mugwort, Common Negative    22. Ash mix Negative    23. Birch mix Negative    24. Beech American Negative    25. Box, Elder Negative    26. Cedar, red Negative    27. Cottonwood, [place] Negative    28. Elm mix Negative    29. Hickory Negative    30. Maple mix Negative    31. Oak, [place] mix Negative    32. Pecan Pollen Negative    33. Pine mix Negative    34. Sycamore Eastern Negative    35. [place], Black Pollen Negative    36. Alternaria alternata Negative    37. Cladosporium Herbarum Negative    38. Aspergillus mix Negative    39. Penicillium mix Negative    40. Bipolaris sorokiniana (Helminthosporium) Negative    41. Drechslera spicifera (Curvularia) Negative    42. Mucor plumbeus Negative    43. Fusarium moniliforme Negative    44. Aureobasidium pullulans (pullulara) Negative    45. Rhizopus oryzae Negative    46. Botrytis cinera Negative    47. Epicoccum nigrum Negative    48. Phoma betae Negative    49. Candida Albicans Negative    50. Trichophyton mentagrophytes Negative    51. Mite, D Farinae  5,000 AU/ml Negative    52. Mite, D Pteronyssinus  5,000 AU/ml Negative    53. Cat Hair 10,000 BAU/ml Negative    54.  Dog Epithelia Negative    55. Mixed Feathers Negative    56. Horse Epithelia Negative    57. Cockroach, German Negative    58. Mouse Negative    59. Tobacco Leaf Negative           Intradermal - 03/16/20 1500    Time Antigen Placed 6967    Allergen Manufacturer Lavella Hammock    Location Arm    Number of Test 15    Control Negative    [place] Negative    Johnson Negative    7 Grass Negative    Ragweed mix Negative    Weed mix Negative    Tree mix Negative    Mold 1 Negative    Mold 2 Negative    Mold 3 Negative    Mold 4 Negative    Cat Negative    Dog Negative    Cockroach Negative    Mite mix Negative          Food Adult Perc -  03/16/20 1500    Time Antigen Placed 9532    Allergen Manufacturer Lavella Hammock    Location Back    Number of allergen test 11    1. Peanut Negative    3. Wheat Negative    12. [place] Negative    28. Oyster Negative    40. Beef Negative    49. Onion Negative    57. Banana Negative    58. Apple Negative    60. Strawberry Negative    64. Chocolate/Cacao bean Negative    69. Ginger Negative           Allergy testing results were read and interpreted by myself, documented by clinical staff.         Salvatore Marvel, MD [facility] [place]

## 2020-03-17 ENCOUNTER — Encounter: Payer: Self-pay | Admitting: Allergy & Immunology

## 2020-03-17 ENCOUNTER — Telehealth: Payer: Self-pay

## 2020-03-17 MED ORDER — BUDESONIDE-FORMOTEROL FUMARATE 160-4.5 MCG/ACT IN AERO: 2.0000 | INHALATION_SPRAY | Freq: Two times a day (BID) | RESPIRATORY_TRACT | 5 refills | Status: DC

## 2020-03-23 DIAGNOSIS — L309 Dermatitis, unspecified: Secondary | ICD-10-CM | POA: Diagnosis not present

## 2020-03-23 DIAGNOSIS — L663 Perifolliculitis capitis abscedens: Secondary | ICD-10-CM | POA: Diagnosis not present

## 2020-03-29 ENCOUNTER — Other Ambulatory Visit: Payer: Self-pay

## 2020-03-29 ENCOUNTER — Encounter: Payer: Self-pay | Admitting: *Deleted

## 2020-03-29 ENCOUNTER — Ambulatory Visit: Payer: PPO | Admitting: Gastroenterology

## 2020-03-29 ENCOUNTER — Encounter: Payer: Self-pay | Admitting: Gastroenterology

## 2020-03-29 VITALS — BP 119/77 | HR 88 | Temp 97.3°F | Ht 63.0 in | Wt 206.8 lb

## 2020-03-29 DIAGNOSIS — R6881 Early satiety: Secondary | ICD-10-CM | POA: Diagnosis not present

## 2020-03-29 NOTE — Progress Notes (Signed)
Referring Provider: Celene Squibb, MD Primary Care Physician:  Celene Squibb, MD Primary GI: Dr. Gala Romney   Chief Complaint  Patient presents with  . Hemorrhoids    Wants to discuss scheduling hemorrhoid banding  . Gastroesophageal Reflux  . Hiccups    Occurs 10-15 minutes after eating/drinking    HPI:   Kendra Orozco is a 74 y.o. female presenting today with a history of constipation and GERD. Last seen in Aug 2021 with GERD exacerbations. Aciphex increased to BID. No further nocturnal reflux with the BID dosing Aciphex. With eating, she notes feeling some reflux but not burning. Every time eating or drinking water, has hiccups. Usually 5-10 minutes after eating. Present a bit over a month. Trying to stay away from seasonings that flare this up. Rare straws. No dysphagia except if eating nuts. Feels like it gets hung up all the way through her GI tract. Chews them really well. Nausea with antibiotics that she is taking it. Notes early satiety. Feels like burping food that she ate many hours ago.    Feels like hemorrhoids are prolapsing at times. Rare rectal bleeding. Will use cream as needed.   Past Medical History:  Diagnosis Date  . Angio-edema   . Anxiety   . Arthritis   . Asthma   . Cancer ([place])    skin and colon  . GERD (gastroesophageal reflux disease)   . Gout   . Hypertension   . Urticaria     Past Surgical History:  Procedure Laterality Date  . ABDOMINAL HYSTERECTOMY     fibroid tumors  . APPENDECTOMY    . BACK SURGERY    . BREAST CYST EXCISION Right   . CATARACT EXTRACTION W/PHACO Right 11/20/2019   Procedure: CATARACT EXTRACTION PHACO AND INTRAOCULAR LENS PLACEMENT ([place]) RIGHT EYE;  Surgeon: Baruch Goldmann, MD;  Location: [facility];  Service: Ophthalmology;  Laterality: Right;  CDE: 17.30  . CATARACT EXTRACTION W/PHACO Left 12/04/2019   Procedure: CATARACT EXTRACTION PHACO AND INTRAOCULAR LENS PLACEMENT LEFT EYE;  Surgeon: Baruch Goldmann, MD;  Location: [facility];  Service: Ophthalmology;  Laterality: Left;  CDE: 14.59  . COLONOSCOPY  09/16/2007   CWC:BJSE canal hemorrhoids, otherwise normal rectum left-sided diverticula and colonic mucosa appeared normal.  . COLONOSCOPY N/A 03/02/2013   Dr. Chauncy Lean adenoma. Colonic diverticulosis. Anal canal hemorrhoids-likely source of hematochezia. Surveillance 2019  . COLONOSCOPY WITH PROPOFOL N/A 07/29/2017   Non-bleeding internal hemorrhoids, cecal AVMs.   . ESOPHAGOGASTRODUODENOSCOPY  09/16/2007   GBT:DVVOHYWVPXT undulating Z-line, tiny distal esophageal  erosions consistent with mild erosive reflux esophagitis, patulous EG junction, small hiatal hernia, otherwise normal stomach, D1, and D2.  . HEMORRHOID SURGERY     X 2     Current Outpatient Medications  Medication Sig Dispense Refill  . Budeson-Glycopyrrol-Formoterol (BREZTRI AEROSPHERE) 160-9-4.8 MCG/ACT AERO Inhale 1-2 puffs into the lungs 2 (two) times daily.    . calcium carbonate (OS-CAL) 1250 MG chewable tablet Chew 1 tablet by mouth 2 (two) times a week.     . Cholecalciferol ([employer] VIT D 5000 HIGH-POTENCY) 125 MCG (5000 UT) capsule Take 5,000 Units by mouth 2 (two) times a week.    . diphenhydrAMINE (BENADRYL) 25 mg capsule Take 25 mg by mouth daily as needed for allergies.    Marland Kitchen docusate sodium (COLACE) 250 MG capsule Take 250 mg by mouth daily as needed for constipation.    Marland Kitchen doxycycline (VIBRA-TABS) 100 MG tablet Take 100 mg by mouth  2 (two) times daily.    . hydrochlorothiazide (HYDRODIURIL) 25 MG tablet Take 25 mg by mouth daily.    Marland Kitchen ibuprofen (ADVIL,MOTRIN) 200 MG tablet Take 200-400 mg by mouth daily as needed.     Marland Kitchen levocetirizine (XYZAL) 5 MG tablet Take 5 mg by mouth at bedtime.    Marland Kitchen LORazepam (ATIVAN) 0.5 MG tablet Take 0.5 mg by mouth 2 (two) times daily as needed for anxiety.     Marland Kitchen lubiprostone (AMITIZA) 24 MCG capsule TAKE ONE CAPSULE BY MOUTH TWICE DAILY WITH A MEAL. (Patient taking differently: Take 24 mcg by mouth 2 (two)  times daily with a meal. TAKE ONE CAPSULE BY MOUTH TWICE DAILY WITH A MEAL.) 60 capsule 5  . Menthol-Methyl Salicylate (MUSCLE RUB) 10-15 % CREA Apply 1 application topically as needed for muscle pain.    . Multiple Vitamin (MULTIVITAMIN WITH MINERALS) TABS tablet Take 1 tablet by mouth daily.    Marland Kitchen olmesartan (BENICAR) 40 MG tablet Take 40 mg by mouth every morning.    . Polyvinyl Alcohol-Povidone (REFRESH OP) Apply to eye as needed.    . RABEprazole (ACIPHEX) 20 MG tablet Take 1 tablet (20 mg total) by mouth 2 (two) times daily before a meal. 180 tablet 3  . Wheat Dextrin (BENEFIBER) POWD Take 1 Package by mouth in the morning and at bedtime. 1 packet=1 tbsp     No current facility-administered medications for this visit.    Allergies as of 03/29/2020 - Review Complete 03/29/2020  Allergen Reaction Noted  . Pantoprazole sodium  07/18/2017  . Latex Rash 02/16/2013  . Sulfa antibiotics Swelling 02/11/2013    Family History  Problem Relation Age of Onset  . Colon cancer Brother        30, deceased  . Breast cancer Sister 59  . Breast cancer Maternal Aunt 28  . Heart attack Mother   . Allergic rhinitis Mother   . Allergic rhinitis Father     Social History   Socioeconomic History  . Marital status: Divorced    Spouse name: Not on file  . Number of children: Not on file  . Years of education: Not on file  . Highest education level: Not on file  Occupational History  . Not on file  Tobacco Use  . Smoking status: Former Smoker    Packs/day: 0.25    Years: 3.00    Pack years: 0.75    Types: Cigarettes  . Smokeless tobacco: Never Used  Vaping Use  . Vaping Use: Never used  Substance and Sexual Activity  . Alcohol use: Yes    Comment: occ  . Drug use: No  . Sexual activity: Not Currently    Birth control/protection: Surgical  Other Topics Concern  . Not on file  Social History Narrative  . Not on file   Social Determinants of Health   Financial Resource Strain: Not  on file  Food Insecurity: Not on file  Transportation Needs: Not on file  Physical Activity: Not on file  Stress: Not on file  Social Connections: Not on file    Review of Systems: Gen: Denies fever, chills, anorexia. Denies fatigue, weakness, weight loss.  CV: Denies chest pain, palpitations, syncope, peripheral edema, and claudication. Resp: Denies dyspnea at rest, cough, wheezing, coughing up blood, and pleurisy. GI: see HPI Derm: Denies rash, itching, dry skin Psych: Denies depression, anxiety, memory loss, confusion. No homicidal or suicidal ideation.  Heme: Denies bruising, bleeding, and enlarged lymph nodes.  Physical Exam: BP  119/77   Pulse 88   Temp (!) 97.3 F (36.3 C) (Temporal)   Ht 5\' 3"  (1.6 m)   Wt 206 lb 12.8 oz (93.8 kg)   BMI 36.63 kg/m  General:   Alert and oriented. No distress noted. Pleasant and cooperative.  Head:  Normocephalic and atraumatic. Eyes:  Conjuctiva clear without scleral icterus. Mouth:   Mask in place Abdomen:  +BS, soft, non-tender and non-distended. No rebound or guarding. No HSM or masses noted. Rectal: hemorrhoid tags, no fissure, DRE without mass, query internal hemorrhoids Msk:  Symmetrical without gross deformities. Normal posture. Extremities:  Without edema. Neurologic:  Alert and  oriented x4 Psych:  Alert and cooperative. Normal mood and affect.  ASSESSMENT: FYNLEY CHRYSTAL is a 74 y.o. female presenting today with history of constipation and GERD, presenting in follow-up.  Constipation well -managed with Amitiza 24 mcg BID. Noting symptomatic internal hemorrhoids. We discussed banding and will arrange outpatient banding in the near future.   GERD improved with Aciphex BID; however, still noting early satiety and tasting food with belching hours after eating. Previously have felt she may have delayed gastric emptying. Will pursue GES in near future.     PLAN:  GES  Aciphex BID  Banding appointment in future  Annitta Needs, PhD, [facility] Gastroenterology

## 2020-03-29 NOTE — Patient Instructions (Signed)
We are arranging a gastric emptying study in the near future.  Continue Aciphex twice a day as you are doing.  I will see you for a banding appointment in the near future!  Have a wonderful Christmas!  I enjoyed seeing you again today! As you know, I value our relationship and want to provide genuine, compassionate, and quality care. I welcome your feedback. If you receive a survey regarding your visit,  I greatly appreciate you taking time to fill this out. See you next time!  Annitta Needs, PhD, ANP-BC [facility] Gastroenterology

## 2020-03-31 NOTE — Progress Notes (Signed)
CC'ED TO PCP 

## 2020-04-05 ENCOUNTER — Other Ambulatory Visit: Payer: Self-pay

## 2020-04-05 ENCOUNTER — Encounter (HOSPITAL_COMMUNITY): Payer: Self-pay

## 2020-04-05 ENCOUNTER — Encounter (HOSPITAL_COMMUNITY)
Admission: RE | Admit: 2020-04-05 | Discharge: 2020-04-05 | Disposition: A | Discharge status: Home / Self Care | Payer: PPO | Source: Ambulatory Visit | Attending: Gastroenterology | Admitting: Gastroenterology

## 2020-04-05 DIAGNOSIS — R6881 Early satiety: Secondary | ICD-10-CM | POA: Diagnosis not present

## 2020-04-05 DIAGNOSIS — R109 Unspecified abdominal pain: Secondary | ICD-10-CM | POA: Diagnosis not present

## 2020-04-05 MED ORDER — TECHNETIUM TC 99M SULFUR COLLOID
2.0000 | Freq: Once | INTRAVENOUS | Status: AC | PRN
  Administered 2020-04-05: 2.1 via ORAL

## 2020-04-15 DIAGNOSIS — K219 Gastro-esophageal reflux disease without esophagitis: Secondary | ICD-10-CM | POA: Diagnosis not present

## 2020-04-15 DIAGNOSIS — R351 Nocturia: Secondary | ICD-10-CM | POA: Diagnosis not present

## 2020-04-15 DIAGNOSIS — J309 Allergic rhinitis, unspecified: Secondary | ICD-10-CM | POA: Diagnosis not present

## 2020-04-15 DIAGNOSIS — I10 Essential (primary) hypertension: Secondary | ICD-10-CM | POA: Diagnosis not present

## 2020-04-15 DIAGNOSIS — E669 Obesity, unspecified: Secondary | ICD-10-CM | POA: Diagnosis not present

## 2020-04-15 DIAGNOSIS — K588 Other irritable bowel syndrome: Secondary | ICD-10-CM | POA: Diagnosis not present

## 2020-04-15 DIAGNOSIS — K649 Unspecified hemorrhoids: Secondary | ICD-10-CM | POA: Diagnosis not present

## 2020-04-15 DIAGNOSIS — G4700 Insomnia, unspecified: Secondary | ICD-10-CM | POA: Diagnosis not present

## 2020-04-15 DIAGNOSIS — E782 Mixed hyperlipidemia: Secondary | ICD-10-CM | POA: Diagnosis not present

## 2020-04-15 DIAGNOSIS — J4520 Mild intermittent asthma, uncomplicated: Secondary | ICD-10-CM | POA: Diagnosis not present

## 2020-04-15 DIAGNOSIS — F411 Generalized anxiety disorder: Secondary | ICD-10-CM | POA: Diagnosis not present

## 2020-04-25 DIAGNOSIS — M9903 Segmental and somatic dysfunction of lumbar region: Secondary | ICD-10-CM | POA: Diagnosis not present

## 2020-04-25 DIAGNOSIS — M9901 Segmental and somatic dysfunction of cervical region: Secondary | ICD-10-CM | POA: Diagnosis not present

## 2020-04-25 DIAGNOSIS — M5441 Lumbago with sciatica, right side: Secondary | ICD-10-CM | POA: Diagnosis not present

## 2020-04-25 DIAGNOSIS — M9902 Segmental and somatic dysfunction of thoracic region: Secondary | ICD-10-CM | POA: Diagnosis not present

## 2020-04-25 DIAGNOSIS — M542 Cervicalgia: Secondary | ICD-10-CM | POA: Diagnosis not present

## 2020-04-25 DIAGNOSIS — M546 Pain in thoracic spine: Secondary | ICD-10-CM | POA: Diagnosis not present

## 2020-04-27 ENCOUNTER — Encounter: Payer: Self-pay | Admitting: Allergy & Immunology

## 2020-04-27 ENCOUNTER — Ambulatory Visit (INDEPENDENT_AMBULATORY_CARE_PROVIDER_SITE_OTHER): Payer: PPO | Admitting: Allergy & Immunology

## 2020-04-27 ENCOUNTER — Other Ambulatory Visit: Payer: Self-pay

## 2020-04-27 VITALS — BP 118/70 | HR 73 | Temp 98.0°F | Resp 18 | Ht 63.78 in | Wt 207.0 lb

## 2020-04-27 DIAGNOSIS — R21 Rash and other nonspecific skin eruption: Secondary | ICD-10-CM | POA: Diagnosis not present

## 2020-04-27 DIAGNOSIS — J310 Chronic rhinitis: Secondary | ICD-10-CM | POA: Diagnosis not present

## 2020-04-27 DIAGNOSIS — J4520 Mild intermittent asthma, uncomplicated: Secondary | ICD-10-CM | POA: Diagnosis not present

## 2020-04-27 MED ORDER — AMOXICILLIN-POT CLAVULANATE 875-125 MG PO TABS: 1.0000 | ORAL_TABLET | Freq: Two times a day (BID) | ORAL | 0 refills | Status: AC

## 2020-04-27 NOTE — Patient Instructions (Addendum)
1. Moderate persistent asthma, uncomplicated - Lung testing not done today. - We are not going to make any changes at this time since you are doing so well. - Continue with the Symbicort twice daily.  - Spacer sample and demonstration provided. - Daily controller medication(s): Symbicort 160/4.95mcg one puff twice daily with spacer - Prior to physical activity: albuterol 2 puffs 10-15 minutes before physical activity. - Rescue medications: albuterol 4 puffs every 4-6 hours as needed - Asthma control goals:  * Full participation in all desired activities (may need albuterol before activity) * Albuterol use two time or less a week on average (not counting use with activity) * Cough interfering with sleep two time or less a month * Oral steroids no more than once a year * No hospitalizations  2. Chronic rhinitis - We are going to treat you with antibiotics for two weeks to see if this helps at all.  - I think an ENT (ears, nose, and throat) referral would be helpful for you. - We will put that referral in and they will call you to schedule this.  - Continue with: Flonase (fluticasone) one spray per nostril on Mondays, Wednesdays, and Fridays  3. Return in about 3 months (around 07/26/2020).     Please inform us of any Emergency Department visits, hospitalizations, or changes in symptoms. Call us before going to the ED for breathing or allergy symptoms since we might be able to fit you in for a sick visit. Feel free to contact us anytime with any questions, problems, or concerns.  It was a pleasure to see you again today!  Websites that have reliable patient information: 1. American Academy of Asthma, Allergy, and Immunology: www.aaaai.org 2. Food Allergy Research and Education (FARE): foodallergy.org 3. Mothers of Asthmatics: http://www.asthmacommunitynetwork.org 4. American College of Allergy, Asthma, and Immunology: www.acaai.org   COVID-19 Vaccine Information can be found at:  [email or web address].co.uk For questions related to vaccine distribution or appointments, please email vaccine@[email or web address] [email or web address] or call [phone].     "Like" [place] on Facebook and Instagram for our latest updates!       Make sure you are registered to vote! If you have moved or changed any of your contact information, you will need to get this updated before voting!  In some cases, you MAY be able to register to vote online: [email or web address]

## 2020-04-27 NOTE — Progress Notes (Signed)
FOLLOW UP  Date of Service/Encounter:  04/27/20   Assessment:   Mild intermittent asthma, uncomplicated  Chronic non-allergic rhinitis  Food intolerance  Rash  Plan/Recommendations:   1. Moderate persistent asthma, uncomplicated - Lung testing not done today. - We are not going to make any changes at this time since you are doing so well. - Continue with the Symbicort twice daily.  - Spacer sample and demonstration provided. - Daily controller medication(s): Symbicort 160/4.435mcg one puff twice daily with spacer - Prior to physical activity: albuterol 2 puffs 10-15 minutes before physical activity. - Rescue medications: albuterol 4 puffs every 4-6 hours as needed - Asthma control goals:  * Full participation in all desired activities (may need albuterol before activity) * Albuterol use two time or less a week on average (not counting use with activity) * Cough interfering with sleep two time or less a month * Oral steroids no more than once a year * No hospitalizations  2. Chronic rhinitis - We are going to treat you with antibiotics for two weeks to see if this helps at all.  - I think an ENT (ears, nose, and throat) referral would be helpful for you. - We will put that referral in and they will call you to schedule this.  - Continue with: Flonase (fluticasone) one spray per nostril on Mondays, Wednesdays, and Fridays  3. Return in about 3 months (around 07/26/2020).   Subjective:   Kendra Orozco is a 75 y.o. female presenting today for follow up of  Chief Complaint  Patient presents with  . Asthma    Kendra CellaRachel J Orozco has a history of the following: Patient Active Problem List   Diagnosis Date Noted  . Constipation 04/15/2013  . Rectal bleeding 02/11/2013  . GERD (gastroesophageal reflux disease) 02/11/2013    History obtained from: chart review and patient.  Kendra ContrasRachel is a 75 y.o. female presenting for a follow up visit.  She was last seen in December  2021.  At that time, her lung testing looked good.  We recommended using her controller medication every day.  [facility] Symbicort 160/4.5 mcg 2 puffs twice daily as well as albuterol as needed.  She had testing that was negative to the entire panel, including percutaneous and intradermal testing.  We continued her Flonase. We did some selected food allergy testing due to her history of urticaria and this was negative. We did order some labs which have not been collected.   Since the last visit, she has done well.   Asthma/Respiratory Symptom History: She remains on the Symbicort two puffs twice daily. This is controlling her symptoms well. She does not remember the last time that she picked up the emergency inhaler, but maybe around 3 years ago. She got aggravated with something and "got to coughing and could not quit".   Allergic Rhinitis Symptom History: She does continue to have sinus issues every day. She will take a sinus pill every day and Benadryl in the morning. The sinus pills dry it up too much and the Benadryl helps with the ohter allergies causing issues. She remains on the fluticasone, but she does not use it nightly due to nose bleeds. It is rather annoying when this happens. It helps to not use it. She uses it around three times per week. She has not had antibiotics for sinus infections.   She did go to seen ENT around 40 years ago. She is unsure what they did,  but denies that she had surgery. She seems to have a leftward septal deviation.  She did go to see Dr. Modena Nunnery and was on doxycycline for a couple of weeks which helps. Then she uses an antibiotic cream on the places that are itching. She is seeing him twice annually usually. She has had some skin cancers that have been removed.   She is on twice daily Amitiza and rabeprazole. She sees Dr. Gala Romney, whom has followed her for a number of years.   Otherwise, there have been no changes to her past medical history,  surgical history, family history, or social history.    Review of Systems  Constitutional: Negative.  Negative for chills, fever, malaise/fatigue and weight loss.  HENT: Positive for congestion. Negative for ear discharge, ear pain and sinus pain.   Eyes: Negative for pain, discharge and redness.  Respiratory: Positive for cough and shortness of breath. Negative for sputum production and wheezing.   Cardiovascular: Negative.  Negative for chest pain and palpitations.  Gastrointestinal: Negative for abdominal pain, constipation, diarrhea, heartburn, nausea and vomiting.  Skin: Negative.  Negative for itching and rash.  Neurological: Negative for dizziness and headaches.  Endo/Heme/Allergies: Positive for environmental allergies. Does not bruise/bleed easily.       Objective:   Blood pressure 118/70, pulse 73, temperature 98 F (36.7 C), temperature source Temporal, resp. rate 18, height 5' 3.78" (1.62 m), weight 207 lb (93.9 kg), SpO2 98 %. Body mass index is 35.78 kg/m.   Physical Exam:  Physical Exam Constitutional:      Appearance: She is well-developed.     Comments: Talkative. Wearing a camouflage T-shirt.   HENT:     Head: Normocephalic and atraumatic.     Right Ear: Tympanic membrane, ear canal and external ear normal.     Left Ear: Tympanic membrane, ear canal and external ear normal.     Nose: No nasal deformity, septal deviation, mucosal edema, rhinorrhea or epistaxis.     Right Turbinates: Enlarged and swollen.     Left Turbinates: Enlarged and swollen.     Right Sinus: No maxillary sinus tenderness or frontal sinus tenderness.     Left Sinus: No maxillary sinus tenderness or frontal sinus tenderness.     Mouth/Throat:     Mouth: Oropharynx is clear and moist. Mucous membranes are not pale and not dry.     Pharynx: Uvula midline.     Comments: Cobblestoning in the posterior oropharynx. Tonsils normally sized bilaterally.  Eyes:     General:        Right eye:  No discharge.        Left eye: No discharge.     Extraocular Movements: EOM normal.     Conjunctiva/sclera: Conjunctivae normal.     Right eye: Right conjunctiva is not injected. No chemosis.    Left eye: Left conjunctiva is not injected. No chemosis.    Pupils: Pupils are equal, round, and reactive to light.  Cardiovascular:     Rate and Rhythm: Normal rate and regular rhythm.     Heart sounds: Normal heart sounds.  Pulmonary:     Effort: Pulmonary effort is normal. No tachypnea, accessory muscle usage or respiratory distress.     Breath sounds: Normal breath sounds. No wheezing, rhonchi or rales.  Chest:     Chest wall: No tenderness.  Lymphadenopathy:     Cervical: No cervical adenopathy.  Skin:    General: Skin is warm.     Capillary Refill: Capillary  refill takes less than 2 seconds.     Coloration: Skin is not pale.     Findings: No abrasion, erythema, petechiae or rash. Rash is not papular, urticarial or vesicular.     Comments: No rashes or urticarial lesions noted.   Neurological:     Mental Status: She is alert.  Psychiatric:        Mood and Affect: Mood and affect normal.      Diagnostic studies: none     Salvatore Marvel, MD  Allergy and [facility]

## 2020-04-29 ENCOUNTER — Telehealth: Payer: Self-pay

## 2020-04-29 NOTE — Telephone Encounter (Signed)
-----   Message from Valentina Shaggy, MD sent at 04/27/2020  5:10 PM EST ----- ENT referral for chronic rhinitis.

## 2020-04-29 NOTE — Telephone Encounter (Signed)
Referral placed to Dr Benjamine Mola.  Patient has been informed of this information & will call us if she doesn't hear from their office next week.   Thanks

## 2020-05-10 DIAGNOSIS — J342 Deviated nasal septum: Secondary | ICD-10-CM | POA: Diagnosis not present

## 2020-05-10 DIAGNOSIS — R040 Epistaxis: Secondary | ICD-10-CM | POA: Diagnosis not present

## 2020-05-10 DIAGNOSIS — J343 Hypertrophy of nasal turbinates: Secondary | ICD-10-CM | POA: Diagnosis not present

## 2020-05-10 DIAGNOSIS — J310 Chronic rhinitis: Secondary | ICD-10-CM | POA: Diagnosis not present

## 2020-05-10 DIAGNOSIS — R070 Pain in throat: Secondary | ICD-10-CM | POA: Diagnosis not present

## 2020-05-11 DIAGNOSIS — M9903 Segmental and somatic dysfunction of lumbar region: Secondary | ICD-10-CM | POA: Diagnosis not present

## 2020-05-11 DIAGNOSIS — M542 Cervicalgia: Secondary | ICD-10-CM | POA: Diagnosis not present

## 2020-05-11 DIAGNOSIS — M9901 Segmental and somatic dysfunction of cervical region: Secondary | ICD-10-CM | POA: Diagnosis not present

## 2020-05-11 DIAGNOSIS — M546 Pain in thoracic spine: Secondary | ICD-10-CM | POA: Diagnosis not present

## 2020-05-11 DIAGNOSIS — M9902 Segmental and somatic dysfunction of thoracic region: Secondary | ICD-10-CM | POA: Diagnosis not present

## 2020-05-11 DIAGNOSIS — M5441 Lumbago with sciatica, right side: Secondary | ICD-10-CM | POA: Diagnosis not present

## 2020-05-12 ENCOUNTER — Ambulatory Visit: Payer: PPO | Admitting: Gastroenterology

## 2020-05-12 ENCOUNTER — Other Ambulatory Visit: Payer: Self-pay

## 2020-05-12 ENCOUNTER — Encounter: Payer: Self-pay | Admitting: Gastroenterology

## 2020-05-12 VITALS — BP 126/78 | HR 74 | Temp 96.9°F | Ht 64.0 in | Wt 206.0 lb

## 2020-05-12 DIAGNOSIS — K642 Third degree hemorrhoids: Secondary | ICD-10-CM | POA: Diagnosis not present

## 2020-05-12 NOTE — Progress Notes (Signed)
[facility] Banding Note:   75 year old female with history of constipation, symptomatic hemorrhoids with prolapsing, rare rectal bleeding, some itching, and pressure. Last colonoscopy in 2019 with non-bleeding internal hemorrhoids and cecal AVMs. Here to pursue banding.   The patient presents with symptomatic grade 2-3 hemorrhoids, unresponsive to maximal medical therapy, requesting rubber band ligation of her hemorrhoidal disease. All risks, benefits, and alternative forms of therapy were described and informed consent was obtained.  The decision was made to band the left lateral internal hemorrhoid, and the [place] was used to perform band ligation without complication. Digital anorectal examination was then performed to assure proper positioning of the band, and to adjust the banded tissue as required. The patient was discharged home without pain or other issues. Dietary and behavioral recommendations were given and (if necessary prescriptions were given), along with follow-up instructions. The patient will return in several weeks for followup and possible additional banding as required.  No complications were encountered and the patient tolerated the procedure well.   Annitta Needs, PhD, ANP-BC [facility] Gastroenterology

## 2020-05-12 NOTE — Patient Instructions (Signed)
I will see you in several weeks for repeat banding!   Continue to avoid straining, prolonged toilet time.  We will see you soon!  I enjoyed seeing you again today! As you know, I value our relationship and want to provide genuine, compassionate, and quality care. I welcome your feedback. If you receive a survey regarding your visit,  I greatly appreciate you taking time to fill this out. See you next time!  Annitta Needs, PhD, ANP-BC [facility] Gastroenterology

## 2020-05-13 NOTE — Telephone Encounter (Signed)
Made in error

## 2020-05-14 DIAGNOSIS — E782 Mixed hyperlipidemia: Secondary | ICD-10-CM | POA: Diagnosis not present

## 2020-05-14 DIAGNOSIS — J4520 Mild intermittent asthma, uncomplicated: Secondary | ICD-10-CM | POA: Diagnosis not present

## 2020-05-14 DIAGNOSIS — F411 Generalized anxiety disorder: Secondary | ICD-10-CM | POA: Diagnosis not present

## 2020-05-14 DIAGNOSIS — J309 Allergic rhinitis, unspecified: Secondary | ICD-10-CM | POA: Diagnosis not present

## 2020-05-14 DIAGNOSIS — I10 Essential (primary) hypertension: Secondary | ICD-10-CM | POA: Diagnosis not present

## 2020-05-14 DIAGNOSIS — E669 Obesity, unspecified: Secondary | ICD-10-CM | POA: Diagnosis not present

## 2020-05-14 DIAGNOSIS — G4700 Insomnia, unspecified: Secondary | ICD-10-CM | POA: Diagnosis not present

## 2020-05-14 DIAGNOSIS — K588 Other irritable bowel syndrome: Secondary | ICD-10-CM | POA: Diagnosis not present

## 2020-05-14 DIAGNOSIS — R351 Nocturia: Secondary | ICD-10-CM | POA: Diagnosis not present

## 2020-05-14 DIAGNOSIS — K649 Unspecified hemorrhoids: Secondary | ICD-10-CM | POA: Diagnosis not present

## 2020-05-14 DIAGNOSIS — K219 Gastro-esophageal reflux disease without esophagitis: Secondary | ICD-10-CM | POA: Diagnosis not present

## 2020-05-18 DIAGNOSIS — M542 Cervicalgia: Secondary | ICD-10-CM | POA: Diagnosis not present

## 2020-05-18 DIAGNOSIS — M546 Pain in thoracic spine: Secondary | ICD-10-CM | POA: Diagnosis not present

## 2020-05-18 DIAGNOSIS — M5441 Lumbago with sciatica, right side: Secondary | ICD-10-CM | POA: Diagnosis not present

## 2020-05-18 DIAGNOSIS — M9902 Segmental and somatic dysfunction of thoracic region: Secondary | ICD-10-CM | POA: Diagnosis not present

## 2020-05-18 DIAGNOSIS — M9903 Segmental and somatic dysfunction of lumbar region: Secondary | ICD-10-CM | POA: Diagnosis not present

## 2020-05-18 DIAGNOSIS — M9901 Segmental and somatic dysfunction of cervical region: Secondary | ICD-10-CM | POA: Diagnosis not present

## 2020-05-25 DIAGNOSIS — M9903 Segmental and somatic dysfunction of lumbar region: Secondary | ICD-10-CM | POA: Diagnosis not present

## 2020-05-25 DIAGNOSIS — M5441 Lumbago with sciatica, right side: Secondary | ICD-10-CM | POA: Diagnosis not present

## 2020-05-25 DIAGNOSIS — M9902 Segmental and somatic dysfunction of thoracic region: Secondary | ICD-10-CM | POA: Diagnosis not present

## 2020-05-25 DIAGNOSIS — M542 Cervicalgia: Secondary | ICD-10-CM | POA: Diagnosis not present

## 2020-05-25 DIAGNOSIS — M546 Pain in thoracic spine: Secondary | ICD-10-CM | POA: Diagnosis not present

## 2020-05-25 DIAGNOSIS — M9901 Segmental and somatic dysfunction of cervical region: Secondary | ICD-10-CM | POA: Diagnosis not present

## 2020-05-28 DIAGNOSIS — M545 Low back pain, unspecified: Secondary | ICD-10-CM | POA: Diagnosis not present

## 2020-05-31 DIAGNOSIS — U071 COVID-19: Secondary | ICD-10-CM | POA: Diagnosis not present

## 2020-05-31 DIAGNOSIS — R197 Diarrhea, unspecified: Secondary | ICD-10-CM | POA: Diagnosis not present

## 2020-05-31 DIAGNOSIS — R051 Acute cough: Secondary | ICD-10-CM | POA: Diagnosis not present

## 2020-06-06 ENCOUNTER — Observation Stay (HOSPITAL_COMMUNITY)
Admission: EM | Admit: 2020-06-06 | Discharge: 2020-06-07 | Disposition: A | Discharge status: Home / Self Care | Payer: PPO | Attending: Internal Medicine | Admitting: Internal Medicine

## 2020-06-06 ENCOUNTER — Other Ambulatory Visit: Payer: Self-pay

## 2020-06-06 ENCOUNTER — Encounter (HOSPITAL_COMMUNITY): Payer: Self-pay

## 2020-06-06 ENCOUNTER — Emergency Department (HOSPITAL_COMMUNITY): Payer: PPO

## 2020-06-06 DIAGNOSIS — E871 Hypo-osmolality and hyponatremia: Secondary | ICD-10-CM | POA: Diagnosis not present

## 2020-06-06 DIAGNOSIS — Z91040 Latex allergy status: Secondary | ICD-10-CM | POA: Insufficient documentation

## 2020-06-06 DIAGNOSIS — U071 COVID-19: Secondary | ICD-10-CM | POA: Diagnosis present

## 2020-06-06 DIAGNOSIS — E860 Dehydration: Secondary | ICD-10-CM | POA: Diagnosis not present

## 2020-06-06 DIAGNOSIS — E878 Other disorders of electrolyte and fluid balance, not elsewhere classified: Secondary | ICD-10-CM

## 2020-06-06 DIAGNOSIS — K5909 Other constipation: Secondary | ICD-10-CM

## 2020-06-06 DIAGNOSIS — I10 Essential (primary) hypertension: Secondary | ICD-10-CM | POA: Diagnosis not present

## 2020-06-06 DIAGNOSIS — J45909 Unspecified asthma, uncomplicated: Secondary | ICD-10-CM | POA: Diagnosis not present

## 2020-06-06 DIAGNOSIS — Z87891 Personal history of nicotine dependence: Secondary | ICD-10-CM | POA: Diagnosis not present

## 2020-06-06 DIAGNOSIS — R7401 Elevation of levels of liver transaminase levels: Secondary | ICD-10-CM | POA: Insufficient documentation

## 2020-06-06 DIAGNOSIS — Z85828 Personal history of other malignant neoplasm of skin: Secondary | ICD-10-CM | POA: Insufficient documentation

## 2020-06-06 DIAGNOSIS — K219 Gastro-esophageal reflux disease without esophagitis: Secondary | ICD-10-CM | POA: Diagnosis not present

## 2020-06-06 DIAGNOSIS — Z79899 Other long term (current) drug therapy: Secondary | ICD-10-CM | POA: Diagnosis not present

## 2020-06-06 DIAGNOSIS — E876 Hypokalemia: Secondary | ICD-10-CM | POA: Insufficient documentation

## 2020-06-06 DIAGNOSIS — R0602 Shortness of breath: Secondary | ICD-10-CM | POA: Diagnosis not present

## 2020-06-06 DIAGNOSIS — Z85038 Personal history of other malignant neoplasm of large intestine: Secondary | ICD-10-CM | POA: Diagnosis not present

## 2020-06-06 LAB — BASIC METABOLIC PANEL
Anion gap: 9 (ref 5–15)
Anion gap: 8 (ref 5–15)
Anion gap: 8 (ref 5–15)
BUN: 13 mg/dL (ref 8–23)
BUN: 13 mg/dL (ref 8–23)
BUN: 12 mg/dL (ref 8–23)
CO2: 22 mmol/L (ref 22–32)
CO2: 24 mmol/L (ref 22–32)
CO2: 23 mmol/L (ref 22–32)
Calcium: 8.5 mg/dL — ABNORMAL LOW (ref 8.9–10.3)
Calcium: 8.3 mg/dL — ABNORMAL LOW (ref 8.9–10.3)
Calcium: 8.7 mg/dL — ABNORMAL LOW (ref 8.9–10.3)
Chloride: 87 mmol/L — ABNORMAL LOW (ref 98–111)
Chloride: 88 mmol/L — ABNORMAL LOW (ref 98–111)
Chloride: 90 mmol/L — ABNORMAL LOW (ref 98–111)
Creatinine, Ser: 0.66 mg/dL (ref 0.44–1.00)
Creatinine, Ser: 0.7 mg/dL (ref 0.44–1.00)
Creatinine, Ser: 0.68 mg/dL (ref 0.44–1.00)
GFR, Estimated: >60 mL/min (ref >60)
GFR, Estimated: >60 mL/min (ref >60)
GFR, Estimated: >60 mL/min (ref >60)
Glucose, Bld: 97 mg/dL (ref 70–99)
Glucose, Bld: 100 mg/dL — ABNORMAL HIGH (ref 70–99)
Glucose, Bld: 100 mg/dL — ABNORMAL HIGH (ref 70–99)
Potassium: 3.3 mmol/L — ABNORMAL LOW (ref 3.5–5.1)
Potassium: 4.3 mmol/L (ref 3.5–5.1)
Potassium: 3.8 mmol/L (ref 3.5–5.1)
Sodium: 118 mmol/L — CL (ref 135–145)
Sodium: 120 mmol/L — ABNORMAL LOW (ref 135–145)
Sodium: 121 mmol/L — ABNORMAL LOW (ref 135–145)

## 2020-06-06 LAB — CBC WITH DIFFERENTIAL/PLATELET
Abs Immature Granulocytes: 0.2 10*3/uL — ABNORMAL HIGH (ref 0.00–0.07)
Basophils Absolute: 0 10*3/uL (ref 0.0–0.1)
Basophils Relative: 0 %
Eosinophils Absolute: 0 10*3/uL (ref 0.0–0.5)
Eosinophils Relative: 0 %
HCT: 37.9 % (ref 36.0–46.0)
Hemoglobin: 13 g/dL (ref 12.0–15.0)
Immature Granulocytes: 2 %
Lymphocytes Relative: 23 %
Lymphs Abs: 2.4 10*3/uL (ref 0.7–4.0)
MCH: 27.5 pg (ref 26.0–34.0)
MCHC: 34.3 g/dL (ref 30.0–36.0)
MCV: 80.1 fL (ref 80.0–100.0)
Monocytes Absolute: 0.9 10*3/uL (ref 0.1–1.0)
Monocytes Relative: 8 %
Neutro Abs: 7.1 10*3/uL (ref 1.7–7.7)
Neutrophils Relative %: 67 %
Platelets: 279 10*3/uL (ref 150–400)
RBC: 4.73 MIL/uL (ref 3.87–5.11)
RDW: 12.7 % (ref 11.5–15.5)
WBC: 10.7 10*3/uL — ABNORMAL HIGH (ref 4.0–10.5)
nRBC: 0 % (ref 0.0–0.2)

## 2020-06-06 LAB — COMPREHENSIVE METABOLIC PANEL
ALT: 89 U/L — ABNORMAL HIGH (ref 0–44)
AST: 67 U/L — ABNORMAL HIGH (ref 15–41)
Albumin: 3.8 g/dL (ref 3.5–5.0)
Alkaline Phosphatase: 106 U/L (ref 38–126)
Anion gap: 11 (ref 5–15)
BUN: 15 mg/dL (ref 8–23)
CO2: 23 mmol/L (ref 22–32)
Calcium: 8.7 mg/dL — ABNORMAL LOW (ref 8.9–10.3)
Chloride: 82 mmol/L — ABNORMAL LOW (ref 98–111)
Creatinine, Ser: 0.8 mg/dL (ref 0.44–1.00)
GFR, Estimated: >60 mL/min (ref >60)
Glucose, Bld: 109 mg/dL — ABNORMAL HIGH (ref 70–99)
Potassium: 3.3 mmol/L — ABNORMAL LOW (ref 3.5–5.1)
Sodium: 116 mmol/L — CL (ref 135–145)
Total Bilirubin: 0.5 mg/dL (ref 0.3–1.2)
Total Protein: 7.5 g/dL (ref 6.5–8.1)

## 2020-06-06 LAB — RESP PANEL BY RT-PCR (FLU A&B, COVID) ARPGX2
Influenza A by PCR: NEGATIVE
Influenza B by PCR: NEGATIVE
SARS Coronavirus 2 by RT PCR: POSITIVE — AB

## 2020-06-06 LAB — TRIGLYCERIDES: Triglycerides: 98 mg/dL (ref <150)

## 2020-06-06 LAB — D-DIMER, QUANTITATIVE: D-Dimer, Quant: 0.56 ug/mL-FEU — ABNORMAL HIGH (ref 0.00–0.50)

## 2020-06-06 LAB — PROCALCITONIN: Procalcitonin: <0.1 ng/mL

## 2020-06-06 LAB — LACTATE DEHYDROGENASE: LDH: 148 U/L (ref 98–192)

## 2020-06-06 LAB — C-REACTIVE PROTEIN: CRP: 0.8 mg/dL (ref <1.0)

## 2020-06-06 LAB — LACTIC ACID, PLASMA
Lactic Acid, Venous: 2.2 mmol/L (ref 0.5–1.9)
Lactic Acid, Venous: 1.1 mmol/L (ref 0.5–1.9)

## 2020-06-06 LAB — FIBRINOGEN: Fibrinogen: 414 mg/dL (ref 210–475)

## 2020-06-06 LAB — FERRITIN: Ferritin: 424 ng/mL — ABNORMAL HIGH (ref 11–307)

## 2020-06-06 MED ORDER — FLUTICASONE FUROATE-VILANTEROL 200-25 MCG/INH IN AEPB: 1.0000 | INHALATION_SPRAY | Freq: Every day | RESPIRATORY_TRACT | Status: DC

## 2020-06-06 MED ORDER — SODIUM CHLORIDE 0.9 % IV SOLN: INTRAVENOUS | Status: DC

## 2020-06-06 MED ORDER — ASCORBIC ACID 500 MG PO TABS
500.0000 mg | ORAL_TABLET | Freq: Every day | ORAL | Status: DC
  Administered 2020-06-07: 500 mg via ORAL
  Filled 2020-06-06: qty 1

## 2020-06-06 MED ORDER — LEVOCETIRIZINE DIHYDROCHLORIDE 5 MG PO TABS
5.0000 mg | ORAL_TABLET | Freq: Every day | ORAL | Status: DC
Start: 2020-06-06 — End: 2020-06-06

## 2020-06-06 MED ORDER — UMECLIDINIUM BROMIDE 62.5 MCG/INH IN AEPB: 1.0000 | INHALATION_SPRAY | Freq: Every day | RESPIRATORY_TRACT | Status: DC

## 2020-06-06 MED ORDER — SODIUM CHLORIDE 0.9 % IV SOLN: Freq: Once | INTRAVENOUS | Status: AC

## 2020-06-06 MED ORDER — SODIUM CHLORIDE 0.9 % IV BOLUS
500.0000 mL | Freq: Once | INTRAVENOUS | Status: AC
  Administered 2020-06-06: 500 mL via INTRAVENOUS

## 2020-06-06 MED ORDER — LORAZEPAM 0.5 MG PO TABS: 0.2500 mg | ORAL_TABLET | Freq: Three times a day (TID) | ORAL | Status: DC | PRN

## 2020-06-06 MED ORDER — FAMOTIDINE 20 MG PO TABS
20.0000 mg | ORAL_TABLET | Freq: Every day | ORAL | Status: DC
  Administered 2020-06-07: 20 mg via ORAL
  Filled 2020-06-06: qty 1

## 2020-06-06 MED ORDER — ZINC SULFATE 220 (50 ZN) MG PO CAPS
220.0000 mg | ORAL_CAPSULE | Freq: Every day | ORAL | Status: DC
  Administered 2020-06-07: 220 mg via ORAL
  Filled 2020-06-06: qty 1

## 2020-06-06 MED ORDER — BUDESONIDE 180 MCG/ACT IN AEPB: 2.0000 | INHALATION_SPRAY | Freq: Two times a day (BID) | RESPIRATORY_TRACT | Status: DC

## 2020-06-06 MED ORDER — ONDANSETRON HCL 4 MG PO TABS: 4.0000 mg | ORAL_TABLET | Freq: Four times a day (QID) | ORAL | Status: DC | PRN

## 2020-06-06 MED ORDER — ONDANSETRON HCL 4 MG/2ML IJ SOLN: 4.0000 mg | Freq: Four times a day (QID) | INTRAMUSCULAR | Status: DC | PRN

## 2020-06-06 MED ORDER — ADULT MULTIVITAMIN W/MINERALS CH
1.0000 | ORAL_TABLET | Freq: Every day | ORAL | Status: DC
  Administered 2020-06-07: 1 via ORAL
  Filled 2020-06-06: qty 1

## 2020-06-06 MED ORDER — IPRATROPIUM-ALBUTEROL 20-100 MCG/ACT IN AERS: 1.0000 | INHALATION_SPRAY | Freq: Four times a day (QID) | RESPIRATORY_TRACT | Status: DC | PRN

## 2020-06-06 MED ORDER — LORATADINE 10 MG PO TABS
10.0000 mg | ORAL_TABLET | Freq: Every day | ORAL | Status: DC
  Administered 2020-06-06: 10 mg via ORAL
  Filled 2020-06-06: qty 1

## 2020-06-06 MED ORDER — BUDESON-GLYCOPYRROL-FORMOTEROL 160-9-4.8 MCG/ACT IN AERO: 1.0000 | INHALATION_SPRAY | Freq: Two times a day (BID) | RESPIRATORY_TRACT | Status: DC

## 2020-06-06 MED ORDER — POTASSIUM CHLORIDE CRYS ER 20 MEQ PO TBCR
40.0000 meq | EXTENDED_RELEASE_TABLET | ORAL | Status: AC
  Administered 2020-06-06 (×2): 40 meq via ORAL
  Filled 2020-06-06 (×2): qty 2

## 2020-06-06 MED ORDER — GUAIFENESIN-DM 100-10 MG/5ML PO SYRP: 5.0000 mL | ORAL_SOLUTION | ORAL | Status: DC | PRN

## 2020-06-06 MED ORDER — HEPARIN SODIUM (PORCINE) 5000 UNIT/ML IJ SOLN
5000.0000 [IU] | Freq: Three times a day (TID) | INTRAMUSCULAR | Status: DC
  Administered 2020-06-06 – 2020-06-07 (×2): 5000 [IU] via SUBCUTANEOUS
  Filled 2020-06-06 (×2): qty 1

## 2020-06-06 MED ORDER — ACETAMINOPHEN 325 MG PO TABS
650.0000 mg | ORAL_TABLET | Freq: Four times a day (QID) | ORAL | Status: DC | PRN
  Administered 2020-06-06: 650 mg via ORAL
  Filled 2020-06-06: qty 2

## 2020-06-06 MED ORDER — ADULT MULTIVITAMIN W/MINERALS CH: 1.0000 | ORAL_TABLET | Freq: Every day | ORAL | Status: DC

## 2020-06-06 NOTE — H&P (Signed)
History and Physical    KAIDYN HERNANDES JME:[id number] DOB: [birth year] DOA: 06/06/2020  PCP: Celene Squibb, MD   Patient coming from: Home  I have personally briefly reviewed patient's old medical records in [place]  Chief Complaint: General malaise, anorexia, shortness of breath.  HPI: Kendra Orozco is a 75 y.o. female with medical history significant of history of asthma, gastroesophageal reflux disease, hypertension, anxiety and gout; who presented to the hospital secondary to general malaise, worsening shortness of breath sensation and positive history of abnormal Covid test at home.  Patient reports symptoms have been present for approximately 10 days now, associated with decreased appetite, nausea, vomiting and diarrhea.  She felt significantly weak and is slightly more short of breath today the reason why she decided to be evaluated at the emergency department.  No fever, no chills, no productive cough.   Patient also denies dysuria, hematuria, melena, hematochezia, chest pain, focal weakness or any other complaints.  Of note, patient is not vaccinated against COVID.  ED Course: Inflammatory markers reassuring, chest x-ray without acute infiltrates and patient found not to be hypoxic.  Slightly tachypneic with exertion no significant abnormalities in her electrolytes demonstrating hyponatremia, hypochloremia and hypokalemia.  IV fluids given.  TRH consulted to place in the hospital for further evaluation and management.  Review of Systems: As per HPI otherwise all other systems reviewed and are negative.   Past Medical History:  Diagnosis Date  . Angio-edema   . Anxiety   . Arthritis   . Asthma   . Cancer ([place])    skin and colon  . GERD (gastroesophageal reflux disease)   . Gout   . Hypertension   . Urticaria     Past Surgical History:  Procedure Laterality Date  . ABDOMINAL HYSTERECTOMY     fibroid tumors  . APPENDECTOMY    . BACK SURGERY    . BREAST CYST  EXCISION Right   . CATARACT EXTRACTION W/PHACO Right 11/20/2019   Procedure: CATARACT EXTRACTION PHACO AND INTRAOCULAR LENS PLACEMENT ([place]) RIGHT EYE;  Surgeon: Baruch Goldmann, MD;  Location: [facility];  Service: Ophthalmology;  Laterality: Right;  CDE: 17.30  . CATARACT EXTRACTION W/PHACO Left 12/04/2019   Procedure: CATARACT EXTRACTION PHACO AND INTRAOCULAR LENS PLACEMENT LEFT EYE;  Surgeon: Baruch Goldmann, MD;  Location: [facility];  Service: Ophthalmology;  Laterality: Left;  CDE: 14.59  . COLONOSCOPY  09/16/2007   IWL:NLGX canal hemorrhoids, otherwise normal rectum left-sided diverticula and colonic mucosa appeared normal.  . COLONOSCOPY N/A 03/02/2013   Dr. Chauncy Lean adenoma. Colonic diverticulosis. Anal canal hemorrhoids-likely source of hematochezia. Surveillance 2019  . COLONOSCOPY WITH PROPOFOL N/A 07/29/2017   Non-bleeding internal hemorrhoids, cecal AVMs.   . ESOPHAGOGASTRODUODENOSCOPY  09/16/2007   QJJ:HERDEYCXKGY undulating Z-line, tiny distal esophageal  erosions consistent with mild erosive reflux esophagitis, patulous EG junction, small hiatal hernia, otherwise normal stomach, D1, and D2.  . HEMORRHOID SURGERY     X 2     Social History  reports that she has quit smoking. Her smoking use included cigarettes. She has a 0.75 pack-year smoking history. She has never used smokeless tobacco. She reports current alcohol use. She reports that she does not use drugs.  Allergies  Allergen Reactions  . Pantoprazole Sodium     Constipation, GI pain  . Latex Rash  . Sulfa Antibiotics Swelling    Family History  Problem Relation Age of Onset  . Colon cancer Brother        51,  deceased  . Breast cancer Sister 57  . Breast cancer Maternal Aunt 28  . Heart attack Mother   . Allergic rhinitis Mother   . Allergic rhinitis Father     Prior to Admission medications   Medication Sig Start Date End Date Taking? Authorizing Provider  Budeson-Glycopyrrol-Formoterol (BREZTRI AEROSPHERE)  160-9-4.8 MCG/ACT AERO Inhale 1-2 puffs into the lungs 2 (two) times daily.    [provider]  calcium carbonate (OS-CAL) 1250 MG chewable tablet Chew 1 tablet by mouth 2 (two) times a week.     [provider]  Cholecalciferol ([employer] VIT D 5000 HIGH-POTENCY) 125 MCG (5000 UT) capsule Take 5,000 Units by mouth 2 (two) times a week.    [provider]  diphenhydrAMINE (BENADRYL) 25 mg capsule Take 25 mg by mouth daily as needed for allergies.    [provider]  docusate sodium (COLACE) 250 MG capsule Take 250 mg by mouth daily as needed for constipation.    [provider]  FLUZONE HIGH-DOSE QUADRIVALENT 0.7 ML SUSY  02/26/20   [provider]  hydrochlorothiazide (HYDRODIURIL) 25 MG tablet Take 25 mg by mouth daily.    [provider]  ibuprofen (ADVIL,MOTRIN) 200 MG tablet Take 200-400 mg by mouth daily as needed.     [provider]  levocetirizine (XYZAL) 5 MG tablet Take 5 mg by mouth at bedtime.    [provider]  LORazepam (ATIVAN) 0.5 MG tablet Take 0.5 mg by mouth 2 (two) times daily as needed for anxiety.     [provider]  lubiprostone (AMITIZA) 24 MCG capsule TAKE ONE CAPSULE BY MOUTH TWICE DAILY WITH A MEAL. Patient taking differently: Take 24 mcg by mouth 2 (two) times daily with a meal. TAKE ONE CAPSULE BY MOUTH TWICE DAILY WITH A MEAL. 10/30/19   Carlis Stable, NP  Menthol-Methyl Salicylate (MUSCLE RUB) 10-15 % CREA Apply 1 application topically as needed for muscle pain.    [provider]  Multiple Vitamin (MULTIVITAMIN WITH MINERALS) TABS tablet Take 1 tablet by mouth daily.    [provider]  olmesartan (BENICAR) 40 MG tablet Take 40 mg by mouth every morning. 10/26/19   [provider]  Polyvinyl Alcohol-Povidone ([place] OP) Apply to eye as needed.    [provider]  RABEprazole (ACIPHEX) 20 MG tablet Take 1 tablet (20 mg total) by mouth 2 (two) times  daily before a meal. 11/24/19   Annitta Needs, NP  triamcinolone (KENALOG) 0.1 % SMARTSIG:Topical 1 to 2 Times Daily PRN 03/23/20   [provider]  Wheat Dextrin (BENEFIBER) POWD Take 1 Package by mouth. 1-2 times per day    [provider]    Physical Exam: Vitals:   06/06/20 1600 06/06/20 1630 06/06/20 1700 06/06/20 1730  BP: 134/76 131/86 120/62 121/68  Pulse: 70 91 67 67  Resp: 17 20 19 14   Temp:      TempSrc:      SpO2: 99% 100% 98% 99%  Weight:      Height:        Constitutional: No chest pain, currently no nausea vomiting.  Patient expressing general malaise.  She is afebrile. Vitals:   06/06/20 1600 06/06/20 1630 06/06/20 1700 06/06/20 1730  BP: 134/76 131/86 120/62 121/68  Pulse: 70 91 67 67  Resp: 17 20 19 14   Temp:      TempSrc:      SpO2: 99% 100% 98% 99%  Weight:  Height:       Eyes: PERRL, lids and conjunctivae normal; no icterus, no nystagmus. ENMT: Mucous membranes are moist. Posterior pharynx clear of any exudate or lesions. Neck: normal, supple, no masses, no thyromegaly Respiratory: No use of accessory muscles appreciated; no wheezing or crackles.  Good saturation recliner. Cardiovascular: Regular rate, no rubs, no gallops, no JVD, sinus rhythm appreciated on telemetry.  No lower extremity edema appreciated. Abdomen: no tenderness, no masses palpated. No hepatosplenomegaly. Bowel sounds positive.  Musculoskeletal: no clubbing / cyanosis. No joint deformity upper and lower extremities. Good ROM, no contractures. Normal muscle tone.  Skin: no rashes, no petechiae. Neurologic: CN 2-12 grossly intact. Sensation intact, muscle strength 4 out of 5 bilaterally and symmetrically in the setting of poor effort. Psychiatric: Normal judgment and insight. Alert and oriented x 3. Normal mood.   Labs on Admission: I have personally reviewed following labs and imaging studies  CBC: Recent Labs  Lab 06/06/20 0949  WBC 10.7*  NEUTROABS 7.1  HGB  13.0  HCT 37.9  MCV 80.1  PLT 517    Basic Metabolic Panel: Recent Labs  Lab 06/06/20 0949 06/06/20 1407  NA 116* 118*  K 3.3* 3.3*  CL 82* 87*  CO2 23 22  GLUCOSE 109* 97  BUN 15 13  CREATININE 0.80 0.66  CALCIUM 8.7* 8.5*    GFR: Estimated Creatinine Clearance: 63.8 mL/min (by C-G formula based on SCr of 0.66 mg/dL).  Liver Function Tests: Recent Labs  Lab 06/06/20 0949  AST 67*  ALT 89*  ALKPHOS 106  BILITOT 0.5  PROT 7.5  ALBUMIN 3.8    Urine analysis: No results found for: COLORURINE, APPEARANCEUR, LABSPEC, PHURINE, GLUCOSEU, HGBUR, BILIRUBINUR, KETONESUR, PROTEINUR, UROBILINOGEN, NITRITE, LEUKOCYTESUR  Radiological Exams on Admission: DG Chest Port 1 View  Result Date: 06/06/2020 CLINICAL DATA:  Increased shortness of breath.  COVID 1 week ago. EXAM: PORTABLE CHEST 1 VIEW COMPARISON:  05/15/2005. FINDINGS: The heart size and mediastinal contours are within normal limits. Both lungs are clear. No visible pleural effusions or pneumothorax. No acute osseous abnormality. IMPRESSION: No active disease. Electronically Signed   By: Margaretha Sheffield MD   On: 06/06/2020 10:09    EKG: Independently reviewed.  Sinus rhythm; normal rate, normal QT.  No acute ischemic changes.  Assessment/Plan 1-Hyponatremia -In the setting of poor oral intake, dehydration and continue use of diuretics. -Holding diuretics.  Providing fluid resuscitation with close monitoring of patient electrolytes level to prevent too fast repletion. -Patient will be admitted to telemetry bed -Supportive care will be provided -She was initially complaining of nausea/vomiting and diarrhea we will slowly advance diet starting with full liquids and provide as needed antiemetics.  2-COVID-19 infection -No hypoxia, no chest x-ray abnormalities and reassuring inflammatory markers. -Patient is out of the therapeutic window for remdesivir at this time as she has had symptoms for over 10 days now. -No need  for a steroid initiation. -Will provide antitussive medications: Fluid resuscitation and supportive care. -Follow clinical response and continue isolation. -Patient is not vaccinated against Covid.  3-GERD (gastroesophageal reflux disease) -Allergic to Protonix -Will use Pepcid -resume home PPI at discharge  4-HTN (hypertension) -Stable overall -While acutely providing fluid resuscitation will hold antihypertensive agents -Follow vital signs.  5-hypokalemia/hypochloremia -In the setting of GI losses, dehydration and poor oral intake -will replete and follow trend -Checking magnesium and phosphorus levels as well.  6-mild transaminitis -In the setting of COVID-19 infection most likely -Will provide fluid resuscitation -Follow LFTs trend  7-history of asthma -No wheezing currently -Continue as needed bronchodilators and home maintenance inhaler equivalent.   DVT prophylaxis: Heparin Code Status:   Full code. Family Communication:  No family at bedside. Disposition Plan:   Patient is from:  Home  Anticipated DC to:  Home  Anticipated DC date:  06/07/20  Anticipated DC barriers: Fluid resuscitation and electrolyte instability; if unable to get patient is stable in the next 24-48 hours might qualify for inpatient status.  Consults called:  None Admission status:  Observation, telemetry, length of stay less than 2 midnights.  Severity of Illness: Moderate severity; patient with positive Covid infection for approximately 10 days now; presenting with decreased appetite, general malaise and feeling more short of breath.  Chest x-ray, oxygen saturation normalized and on ambulation along with inflammatory markers reassuring and no qualifying her for remdesivir or steroids management.  Patient found to be dehydrated and with hyponatremia (sodium level 118).  Patient will be admitted for fluids mentation and supportive care.    Barton Dubois MD [facility]  How to contact  the [facility] Attending or Consulting provider [place] or covering provider during after hours [place], for this patient?   1. Check the care team in [facility] and look for a) attending/consulting TR[facility] provider listed and b) the [facility] team listed 2. Log into [email or web address] and use [place] universal password to access. If you do not have the password, please contact the hospital operator. 3. Locate the [facility] provider you are looking for under [facility] and page to a number that you can be directly reached. 4. If you still have difficulty reaching the provider, please page the [facility] (Director on Call) for the Hospitalists listed on amion for assistance.  06/06/2020, 6:20 PM

## 2020-06-06 NOTE — ED Notes (Signed)
Pt ambulating from bathroom to room, O2 stats 98-99.

## 2020-06-06 NOTE — ED Notes (Addendum)
Date and time results received: 06/06/20 1130 (use smartphrase ".now" to insert current time)  Test: Lactic Acid Critical Value: 2.2  Test: Sodium Critical Value: 116  Name of Provider Notified: Long, MD  Orders Received? Or Actions Taken?:

## 2020-06-06 NOTE — ED Notes (Signed)
Date and time results received: 06/06/20 1257 (use smartphrase ".now" to insert current time)  Test: Covid Critical Value: Positive  Name of Provider Notified: Long, MD  Orders Received? Or Actions Taken?:

## 2020-06-06 NOTE — ED Triage Notes (Signed)
Pt presents to ED for increased SOB, decreased urinary output, diarrhea. Pt tested positive for covid on Monday.

## 2020-06-06 NOTE — ED Notes (Signed)
Date and time results received: 06/06/20 1458 (use smartphrase ".now" to insert current time)  Test: Sodium Critical Value: 118  Name of Provider Notified: Dr Laverta Baltimore Orders Received? Or Actions Taken?: NA

## 2020-06-06 NOTE — ED Provider Notes (Signed)
Emergency Department Provider Note   I have reviewed the triage vital signs and the nursing notes.   HISTORY  Chief Complaint Shortness of Breath   HPI Kendra Orozco is a 75 y.o. female with past medical history reviewed below including smoking history presents to the emergency department for evaluation of generalized weakness, diarrhea, nausea, shortness of breath in the setting of known Covid infection.  Patient is currently on day 10 of symptoms.  She took a home COVID test on day 3 which was positive.  She has not sought care and has been managing symptoms at home.  She has not received monoclonal antiviral medications.  She is unvaccinated.  She denies any chest pain but feels increasingly short of breath especially with movement.  She is feeling very fatigued with very poor oral intake. Notes HA and body aches along with cough. No radiation of symptoms or other modifying factors.   Past Medical History:  Diagnosis Date  . Angio-edema   . Anxiety   . Arthritis   . Asthma   . Cancer ([place])    skin and colon  . GERD (gastroesophageal reflux disease)   . Gout   . Hypertension   . Urticaria     Patient Active Problem List   Diagnosis Date Noted  . Prolapsed internal hemorrhoids, grade 3 05/12/2020  . Constipation 04/15/2013  . Rectal bleeding 02/11/2013  . GERD (gastroesophageal reflux disease) 02/11/2013    Past Surgical History:  Procedure Laterality Date  . ABDOMINAL HYSTERECTOMY     fibroid tumors  . APPENDECTOMY    . BACK SURGERY    . BREAST CYST EXCISION Right   . CATARACT EXTRACTION W/PHACO Right 11/20/2019   Procedure: CATARACT EXTRACTION PHACO AND INTRAOCULAR LENS PLACEMENT ([place]) RIGHT EYE;  Surgeon: Baruch Goldmann, MD;  Location: [facility];  Service: Ophthalmology;  Laterality: Right;  CDE: 17.30  . CATARACT EXTRACTION W/PHACO Left 12/04/2019   Procedure: CATARACT EXTRACTION PHACO AND INTRAOCULAR LENS PLACEMENT LEFT EYE;  Surgeon: Baruch Goldmann, MD;   Location: [facility];  Service: Ophthalmology;  Laterality: Left;  CDE: 14.59  . COLONOSCOPY  09/16/2007   ZOX:WRUE canal hemorrhoids, otherwise normal rectum left-sided diverticula and colonic mucosa appeared normal.  . COLONOSCOPY N/A 03/02/2013   Dr. Chauncy Lean adenoma. Colonic diverticulosis. Anal canal hemorrhoids-likely source of hematochezia. Surveillance 2019  . COLONOSCOPY WITH PROPOFOL N/A 07/29/2017   Non-bleeding internal hemorrhoids, cecal AVMs.   . ESOPHAGOGASTRODUODENOSCOPY  09/16/2007   AVW:UJWJXBJYNWG undulating Z-line, tiny distal esophageal  erosions consistent with mild erosive reflux esophagitis, patulous EG junction, small hiatal hernia, otherwise normal stomach, D1, and D2.  . HEMORRHOID SURGERY     X 2     Allergies Pantoprazole sodium, Latex, and Sulfa antibiotics  Family History  Problem Relation Age of Onset  . Colon cancer Brother        29, deceased  . Breast cancer Sister 26  . Breast cancer Maternal Aunt 28  . Heart attack Mother   . Allergic rhinitis Mother   . Allergic rhinitis Father     Social History Social History   Tobacco Use  . Smoking status: Former Smoker    Packs/day: 0.25    Years: 3.00    Pack years: 0.75    Types: Cigarettes  . Smokeless tobacco: Never Used  Vaping Use  . Vaping Use: Never used  Substance Use Topics  . Alcohol use: Yes    Comment: occ  . Drug use: No    Review  of Systems  Constitutional: Positive fever/chills w/ body aches and fatigue.  Eyes: No visual changes. ENT: mild sore throat. Cardiovascular: Denies chest pain. Respiratory: Positive shortness of breath and cough.  Gastrointestinal: No abdominal pain. Positive nausea, no vomiting. Positive diarrhea.  No constipation. Genitourinary: Negative for dysuria. Musculoskeletal: Negative for back pain. Skin: Negative for rash. Neurological: Negative for focal weakness or numbness. Positive HA.   10-point ROS otherwise  negative.  ____________________________________________   PHYSICAL EXAM:  VITAL SIGNS: ED Triage Vitals  Enc Vitals Group     BP 06/06/20 0918 116/74     Pulse Rate 06/06/20 0918 85     Resp 06/06/20 0918 18     Temp 06/06/20 0918 99.1 F (37.3 C)     Temp Source 06/06/20 0918 Oral     SpO2 06/06/20 0918 100 %     Weight 06/06/20 0915 180 lb (81.6 kg)     Height 06/06/20 0915 5\' 4"  (1.626 m)   Constitutional: Alert and oriented. Well appearing and in no acute distress. Eyes: Conjunctivae are normal.  Head: Atraumatic. Nose: No congestion/rhinnorhea. Mouth/Throat: Mucous membranes are slightly dry.  Neck: No stridor.   Cardiovascular: Normal rate, regular rhythm. Good peripheral circulation. Grossly normal heart sounds.   Respiratory: Increased respiratory effort.  No retractions. Lungs w/o wheezing but diminished at the bases.  Gastrointestinal: Soft and nontender. No distention.  Musculoskeletal: No lower extremity tenderness nor edema. No gross deformities of extremities. Neurologic:  Normal speech and language. No gross focal neurologic deficits are appreciated.  Skin:  Skin is warm, dry and intact. No rash noted.   ____________________________________________   LABS (all labs ordered are listed, but only abnormal results are displayed)  Labs Reviewed  RESP PANEL BY RT-PCR (FLU A&B, COVID) ARPGX2 - Abnormal; Notable for the following components:      Result Value   SARS Coronavirus 2 by RT PCR POSITIVE (*)    All other components within normal limits  LACTIC ACID, PLASMA - Abnormal; Notable for the following components:   Lactic Acid, Venous 2.2 (*)    All other components within normal limits  CBC WITH DIFFERENTIAL/PLATELET - Abnormal; Notable for the following components:   WBC 10.7 (*)    Abs Immature Granulocytes 0.20 (*)    All other components within normal limits  COMPREHENSIVE METABOLIC PANEL - Abnormal; Notable for the following components:   Sodium 116  (*)    Potassium 3.3 (*)    Chloride 82 (*)    Glucose, Bld 109 (*)    Calcium 8.7 (*)    AST 67 (*)    ALT 89 (*)    All other components within normal limits  D-DIMER, QUANTITATIVE - Abnormal; Notable for the following components:   D-Dimer, Quant 0.56 (*)    All other components within normal limits  FERRITIN - Abnormal; Notable for the following components:   Ferritin 424 (*)    All other components within normal limits  CULTURE, BLOOD (ROUTINE X 2)  CULTURE, BLOOD (ROUTINE X 2)  LACTIC ACID, PLASMA  PROCALCITONIN  LACTATE DEHYDROGENASE  TRIGLYCERIDES  FIBRINOGEN  C-REACTIVE PROTEIN  BASIC METABOLIC PANEL  BASIC METABOLIC PANEL  BASIC METABOLIC PANEL   ____________________________________________  EKG   EKG Interpretation  Date/Time:  Monday June 06 2020 09:18:23 EST Ventricular Rate:  88 PR Interval:    QRS Duration: 99 QT Interval:  380 QTC Calculation: 452 R Axis:   -22 Text Interpretation: Sinus or ectopic atrial rhythm Atrial premature complex  Borderline left axis deviation Confirmed by Nanda Quinton [phone]) on 06/06/2020 9:38:36 AM       ____________________________________________  RADIOLOGY  DG Chest Port 1 View  Result Date: 06/06/2020 CLINICAL DATA:  Increased shortness of breath.  COVID 1 week ago. EXAM: PORTABLE CHEST 1 VIEW COMPARISON:  05/15/2005. FINDINGS: The heart size and mediastinal contours are within normal limits. Both lungs are clear. No visible pleural effusions or pneumothorax. No acute osseous abnormality. IMPRESSION: No active disease. Electronically Signed   By: Margaretha Sheffield MD   On: 06/06/2020 10:09    ____________________________________________   PROCEDURES  Procedure(s) performed:   .Critical Care Performed by: Margette Fast, MD Authorized by: Margette Fast, MD   Critical care provider statement:    Critical care time (minutes):  35   Critical care time was exclusive of:  Separately billable procedures and  treating other patients and teaching time   Critical care was necessary to treat or prevent imminent or life-threatening deterioration of the following conditions:  Dehydration and metabolic crisis   Critical care was time spent personally by me on the following activities:  Discussions with consultants, evaluation of patient's response to treatment, examination of patient, ordering and performing treatments and interventions, ordering and review of laboratory studies, ordering and review of radiographic studies, pulse oximetry, re-evaluation of patient's condition, obtaining history from patient or surrogate, review of old charts, blood draw for specimens and development of treatment plan with patient or surrogate   I assumed direction of critical care for this patient from another provider in my specialty: no     Care discussed with: admitting provider       ____________________________________________   INITIAL IMPRESSION / [place] / ED COURSE  Pertinent labs & imaging results that were available during my care of the patient were reviewed by me and considered in my medical decision making (see chart for details).   Patient presents emergency department for evaluation of shortness of breath with fatigue, body aches, fevers, diarrhea.  Symptoms are consistent with COVID-19 infection and patient tested positive with a home test.  Currently day 10 of symptoms and so outside the range for outpatient antivirals are monoclonal antibodies.  Patient is unvaccinated.  Plan for labs, chest x-ray.  EKG is overall reassuring and interpreted by me as above. Not hypoxemic here but some increased WOB noted on exam which is relatively mild but notable.   Patient's Na coming back very low at 116. This correlates with patient's generalized fatigue and history of poor PO intake. COVID is positive but no hypoxemia, infiltrate on CXR, or severe SOB or CP. With approx 10 days of symptoms patient not  likely to benefit from antivirals or MAB/steroid treatments. Plan for IVF and admit.   Discussed patient's case with TRH, Dr. Dyann Kief to request admission. Patient and family (if present) updated with plan. Care transferred to [facility] service.  I reviewed all nursing notes, vitals, pertinent old records, EKGs, labs, imaging (as available).  ____________________________________________  FINAL CLINICAL IMPRESSION(S) / ED DIAGNOSES  Final diagnoses:  Hyponatremia  Dehydration  COVID-19    MEDICATIONS GIVEN DURING THIS VISIT:  Medications  0.9 %  sodium chloride infusion (has no administration in time range)  sodium chloride 0.9 % bolus 500 mL (0 mLs Intravenous Stopped 06/06/20 1200)    Note:  This document was prepared using Dragon voice recognition software and may include unintentional dictation errors.  Nanda Quinton, MD, [facility] Emergency Medicine    , Vonna Kotyk  G, MD 06/06/20 1418

## 2020-06-07 DIAGNOSIS — E860 Dehydration: Secondary | ICD-10-CM

## 2020-06-07 DIAGNOSIS — U071 COVID-19: Secondary | ICD-10-CM | POA: Diagnosis not present

## 2020-06-07 DIAGNOSIS — E871 Hypo-osmolality and hyponatremia: Secondary | ICD-10-CM

## 2020-06-07 DIAGNOSIS — I10 Essential (primary) hypertension: Secondary | ICD-10-CM | POA: Diagnosis not present

## 2020-06-07 LAB — COMPREHENSIVE METABOLIC PANEL
ALT: 86 U/L — ABNORMAL HIGH (ref 0–44)
AST: 61 U/L — ABNORMAL HIGH (ref 15–41)
Albumin: 3.1 g/dL — ABNORMAL LOW (ref 3.5–5.0)
Alkaline Phosphatase: 87 U/L (ref 38–126)
Anion gap: 7 (ref 5–15)
BUN: 10 mg/dL (ref 8–23)
CO2: 22 mmol/L (ref 22–32)
Calcium: 8.4 mg/dL — ABNORMAL LOW (ref 8.9–10.3)
Chloride: 98 mmol/L (ref 98–111)
Creatinine, Ser: 0.57 mg/dL (ref 0.44–1.00)
GFR, Estimated: >60 mL/min (ref >60)
Glucose, Bld: 84 mg/dL (ref 70–99)
Potassium: 4.4 mmol/L (ref 3.5–5.1)
Sodium: 127 mmol/L — ABNORMAL LOW (ref 135–145)
Total Bilirubin: 0.5 mg/dL (ref 0.3–1.2)
Total Protein: 6.1 g/dL — ABNORMAL LOW (ref 6.5–8.1)

## 2020-06-07 LAB — CBC WITH DIFFERENTIAL/PLATELET
Abs Immature Granulocytes: 0.14 10*3/uL — ABNORMAL HIGH (ref 0.00–0.07)
Basophils Absolute: 0 10*3/uL (ref 0.0–0.1)
Basophils Relative: 0 %
Eosinophils Absolute: 0.1 10*3/uL (ref 0.0–0.5)
Eosinophils Relative: 1 %
HCT: 34.2 % — ABNORMAL LOW (ref 36.0–46.0)
Hemoglobin: 11.4 g/dL — ABNORMAL LOW (ref 12.0–15.0)
Immature Granulocytes: 2 %
Lymphocytes Relative: 28 %
Lymphs Abs: 2.5 10*3/uL (ref 0.7–4.0)
MCH: 27.1 pg (ref 26.0–34.0)
MCHC: 33.3 g/dL (ref 30.0–36.0)
MCV: 81.4 fL (ref 80.0–100.0)
Monocytes Absolute: 0.7 10*3/uL (ref 0.1–1.0)
Monocytes Relative: 8 %
Neutro Abs: 5.6 10*3/uL (ref 1.7–7.7)
Neutrophils Relative %: 61 %
Platelets: 224 10*3/uL (ref 150–400)
RBC: 4.2 MIL/uL (ref 3.87–5.11)
RDW: 13.1 % (ref 11.5–15.5)
WBC: 9 10*3/uL (ref 4.0–10.5)
nRBC: 0 % (ref 0.0–0.2)

## 2020-06-07 LAB — C-REACTIVE PROTEIN: CRP: 0.8 mg/dL (ref <1.0)

## 2020-06-07 LAB — PHOSPHORUS: Phosphorus: 2.5 mg/dL (ref 2.5–4.6)

## 2020-06-07 LAB — MAGNESIUM: Magnesium: 2.1 mg/dL (ref 1.7–2.4)

## 2020-06-07 LAB — GLUCOSE, CAPILLARY: Glucose-Capillary: 105 mg/dL — ABNORMAL HIGH (ref 70–99)

## 2020-06-07 MED ORDER — ONDANSETRON HCL 4 MG PO TABS: 4.0000 mg | ORAL_TABLET | Freq: Four times a day (QID) | ORAL | 0 refills | Status: DC | PRN

## 2020-06-07 MED ORDER — LUBIPROSTONE 24 MCG PO CAPS: ORAL_CAPSULE | ORAL | 5 refills | Status: DC

## 2020-06-07 MED ORDER — SODIUM CHLORIDE 1 G PO TABS: 1.0000 g | ORAL_TABLET | Freq: Two times a day (BID) | ORAL | 0 refills | Status: DC

## 2020-06-07 NOTE — Plan of Care (Signed)

## 2020-06-07 NOTE — Discharge Summary (Signed)
Physician Discharge Summary  Kendra Orozco ZOX:[id number] DOB: 1945-08-12 DOA: 06/06/2020  PCP: Celene Squibb, MD  Admit date: 06/06/2020 Discharge date: 06/07/2020  Admitted From: Home Disposition: Home  Recommendations for Outpatient Follow-up:  1. Follow up with PCP in 1-2 weeks 2. Please obtain BMP/CBC in one week  Home Health: Equipment/Devices:  Discharge Condition: Stable CODE STATUS: Full code Diet recommendation: Heart healthy  Brief/Interim Summary: 75 year old female who was recently diagnosed with COVID-19, admitted to the hospital with vomiting, loose stools, generalized weakness.  She was found to be significantly hyponatremic.  She was taking hydrochlorothiazide prior to admission and likely had significant volume loss from GI illness related to COVID.  She was hydrated with IV fluids and overall lab work has improved.  Clinically she is also feeling better.  GI symptoms appear to be resolving and she is able to keep down solid food/liquids.  Diarrhea has also improved and she is only had 1 bowel movement today.  She is ambulating around her room.  She is felt stable to discharge home and continue oral hydration.  She will need repeat basic metabolic panel in 1 week.  Discharge Diagnoses:  Principal Problem:   Hyponatremia Active Problems:   GERD (gastroesophageal reflux disease)   COVID-19 virus infection   HTN (hypertension)  Hyponatremia In the setting of GI losses from multiple loose stools, poor p.o. intake as well as hydrochlorothiazide use She was started on IV hydration with improvement of serum sodium Overall GI symptoms have also improved Since she is able to keep down oral fluids, I suspect that her serum sodium will continue to improve Currently, she does not appear to have any mental status changes She will need a repeat basic metabolic panel in 1 week  COVID-19 infection No hypoxia, chest x-ray abnormalities Inflammatory markers are reassuring No  indication for steroids or remdesivir  GERD Continue on PPI at discharge  Hypertension Holding hydrochlorothiazide in light of hyponatremia Resume ARB    Discharge Instructions  Discharge Instructions    Diet - low sodium heart healthy   Complete by: As directed    Increase activity slowly   Complete by: As directed      Allergies as of 06/07/2020      Reactions   Pantoprazole Sodium    Constipation, GI pain   Latex Rash   Sulfa Antibiotics Swelling      Medication List    STOP taking these medications   hydrochlorothiazide 25 MG tablet Commonly known as: HYDRODIURIL     TAKE these medications   Benefiber Powd Take 1 Package by mouth. 1-2 times per day   Breztri Aerosphere 160-9-4.8 MCG/ACT Aero Generic drug: Budeson-Glycopyrrol-Formoterol Inhale 1-2 puffs into the lungs 2 (two) times daily.   calcium carbonate 1250 (500 Ca) MG chewable tablet Commonly known as: OS-CAL Chew 1 tablet by mouth 2 (two) times a week.   [employer] Vit D 5000 High-Potency 125 MCG (5000 UT) capsule Generic drug: Cholecalciferol Take 5,000 Units by mouth 2 (two) times a week.   diphenhydrAMINE 25 mg capsule Commonly known as: BENADRYL Take 25 mg by mouth daily as needed for allergies.   docusate sodium 250 MG capsule Commonly known as: COLACE Take 250 mg by mouth daily as needed for constipation.   Fluzone High-Dose Quadrivalent 0.7 ML Susy Generic drug: Influenza Vac High-Dose Quad   ibuprofen 200 MG tablet Commonly known as: ADVIL Take 200-400 mg by mouth daily as needed.   levocetirizine 5 MG tablet Commonly known  asHarlow Ohms Take 5 mg by mouth at bedtime.   LORazepam 0.5 MG tablet Commonly known as: ATIVAN Take 0.5 mg by mouth 2 (two) times daily as needed for anxiety.   lubiprostone 24 MCG capsule Commonly known as: Amitiza TAKE ONE CAPSULE BY MOUTH TWICE DAILY WITH A MEAL. Restart on 2/25 What changed: additional instructions   multivitamin with minerals Tabs  tablet Take 1 tablet by mouth daily.   Muscle Rub 10-15 % Crea Apply 1 application topically as needed for muscle pain.   olmesartan 40 MG tablet Commonly known as: BENICAR Take 40 mg by mouth every morning.   ondansetron 4 MG tablet Commonly known as: ZOFRAN Take 1 tablet (4 mg total) by mouth every 6 (six) hours as needed for nausea.   RABEprazole 20 MG tablet Commonly known as: ACIPHEX Take 1 tablet (20 mg total) by mouth 2 (two) times daily before a meal.   REFRESH OP Apply to eye as needed.   sodium chloride 1 g tablet Take 1 tablet (1 g total) by mouth 2 (two) times daily with a meal.   triamcinolone 0.1 % Commonly known as: KENALOG SMARTSIG:Topical 1 to 2 Times Daily PRN       Allergies  Allergen Reactions  . Pantoprazole Sodium     Constipation, GI pain  . Latex Rash  . Sulfa Antibiotics Swelling    Consultations:     Procedures/Studies: DG Chest Port 1 View  Result Date: 06/06/2020 CLINICAL DATA:  Increased shortness of breath.  COVID 1 week ago. EXAM: PORTABLE CHEST 1 VIEW COMPARISON:  05/15/2005. FINDINGS: The heart size and mediastinal contours are within normal limits. Both lungs are clear. No visible pleural effusions or pneumothorax. No acute osseous abnormality. IMPRESSION: No active disease. Electronically Signed   By: Margaretha Sheffield MD   On: 06/06/2020 10:09       Subjective: She is feeling better.  Had only 1 stool today.  No nausea or vomiting.  Keeping down liquids and solids.  Discharge Exam: Vitals:   06/06/20 2000 06/06/20 2030 06/06/20 2137 06/07/20 1528  BP: 129/77 122/73 125/69 124/67  Pulse: 85 79 80 77  Resp: 20 15 18 20   Temp:  99 F (37.2 C) 98.2 F (36.8 C) 98.6 F (37 C)  TempSrc:   Oral Oral  SpO2: 99% 98% 100% 100%  Weight:      Height:        General: Pt is alert, awake, not in acute distress Cardiovascular: RRR, S1/S2 +, no rubs, no gallops Respiratory: CTA bilaterally, no wheezing, no  rhonchi Abdominal: Soft, NT, ND, bowel sounds + Extremities: no edema, no cyanosis    The results of significant diagnostics from this hospitalization (including imaging, microbiology, ancillary and laboratory) are listed below for reference.     Microbiology: Recent Results (from the past 240 hour(s))  Blood Culture (routine x 2)     Status: None (Preliminary result)   Collection Time: 06/06/20  9:49 AM   Specimen: BLOOD  Result Value Ref Range Status   Specimen Description BLOOD RIGHT ANTECUBITAL  Final   Special Requests   Final    Blood Culture adequate volume BOTTLES DRAWN AEROBIC AND ANAEROBIC   Culture   Final    NO GROWTH < 24 HOURS Performed at [facility], [address] [place], [place] [address]    Report Status PENDING  Incomplete  Resp Panel by RT-PCR (Flu A&B, Covid) Nasopharyngeal Swab     Status: Abnormal   Collection  Time: 06/06/20 10:13 AM   Specimen: Nasopharyngeal Swab; Nasopharyngeal(NP) swabs in vial transport medium  Result Value Ref Range Status   SARS Coronavirus 2 by RT PCR POSITIVE (A) NEGATIVE Final    Comment: RESULT CALLED TO, READ BACK BY AND VERIFIED WITH: EASTER,T. RN @1257  06/06/20 BILLINGSLEY,L (NOTE) SARS-CoV-2 target nucleic acids are DETECTED.  The SARS-CoV-2 RNA is generally detectable in upper respiratory specimens during the acute phase of infection. Positive results are indicative of the presence of the identified virus, but do not rule out bacterial infection or co-infection with other pathogens not detected by the test. Clinical correlation with patient history and other diagnostic information is necessary to determine patient infection status. The expected result is Negative.  Fact Sheet for Patients: EntrepreneurPulse.com.au  Fact Sheet for Healthcare Providers: IncredibleEmployment.be  This test is not yet approved or cleared by the [place] FDA and  has been authorized for  detection and/or diagnosis of SARS-CoV-2 by FDA under an Emergency Use Authorization (EUA).  This EUA will remain in effect (meaning this test  can be used) for the duration of  the COVID-19 declaration under Section 564(b)(1) of the Act, 21 U.S.C. section 360bbb-3(b)(1), unless the authorization is terminated or revoked sooner.     Influenza A by PCR NEGATIVE NEGATIVE Final   Influenza B by PCR NEGATIVE NEGATIVE Final    Comment: (NOTE) The Xpert Xpress SARS-CoV-2/FLU/RSV plus assay is intended as an aid in the diagnosis of influenza from Nasopharyngeal swab specimens and should not be used as a sole basis for treatment. Nasal washings and aspirates are unacceptable for Xpert Xpress SARS-CoV-2/FLU/RSV testing.  Fact Sheet for Patients: EntrepreneurPulse.com.au  Fact Sheet for Healthcare Providers: IncredibleEmployment.be  This test is not yet approved or cleared by the [place] FDA and has been authorized for detection and/or diagnosis of SARS-CoV-2 by FDA under an Emergency Use Authorization (EUA). This EUA will remain in effect (meaning this test can be used) for the duration of the COVID-19 declaration under Section 564(b)(1) of the Act, 21 U.S.C. section 360bbb-3(b)(1), unless the authorization is terminated or revoked.  Performed at [facility], [address], [place], [place] [address]   Blood Culture (routine x 2)     Status: None (Preliminary result)   Collection Time: 06/06/20 11:14 AM   Specimen: BLOOD  Result Value Ref Range Status   Specimen Description BLOOD BLOOD RIGHT HAND  Final   Special Requests   Final    Blood Culture adequate volume BOTTLES DRAWN AEROBIC AND ANAEROBIC   Culture   Final    NO GROWTH < 24 HOURS Performed at [facility], [address], [place], [place] [address]    Report Status PENDING  Incomplete     Labs: BNP (last 3 results) No results for input(s): BNP in the last 8760 hours. Basic  Metabolic Panel: Recent Labs  Lab 06/06/20 0949 06/06/20 1407 06/06/20 1645 06/06/20 1949 06/07/20 0508  NA 116* 118* 120* 121* 127*  K 3.3* 3.3* 4.3 3.8 4.4  CL 82* 87* 88* 90* 98  CO2 23 22 24 23 22   GLUCOSE 109* 97 100* 100* 84  BUN 15 13 13 12 10   CREATININE 0.80 0.66 0.70 0.68 0.57  CALCIUM 8.7* 8.5* 8.3* 8.7* 8.4*  MG  --   --   --   --  2.1  PHOS  --   --   --   --  2.5   Liver Function Tests: Recent Labs  Lab 06/06/20 0949 06/07/20  0508  AST 67* 61*  ALT 89* 86*  ALKPHOS 106 87  BILITOT 0.5 0.5  PROT 7.5 6.1*  ALBUMIN 3.8 3.1*   No results for input(s): LIPASE, AMYLASE in the last 168 hours. No results for input(s): AMMONIA in the last 168 hours. CBC: Recent Labs  Lab 06/06/20 0949 06/07/20 0508  WBC 10.7* 9.0  NEUTROABS 7.1 5.6  HGB 13.0 11.4*  HCT 37.9 34.2*  MCV 80.1 81.4  PLT 279 224   Cardiac Enzymes: No results for input(s): CKTOTAL, CKMB, CKMBINDEX, TROPONINI in the last 168 hours. BNP: Invalid input(s): POCBNP CBG: Recent Labs  Lab 06/06/20 2129  GLUCAP 105*   D-Dimer Recent Labs    06/06/20 0949  DDIMER 0.56*   Hgb A1c No results for input(s): HGBA1C in the last 72 hours. Lipid Profile Recent Labs    06/06/20 0949  TRIG 98   Thyroid function studies No results for input(s): TSH, T4TOTAL, T3FREE, THYROIDAB in the last 72 hours.  Invalid input(s): FREET3 Anemia work up [employer]    06/06/20 [place]*   Urinalysis No results found for: COLORURINE, APPEARANCEUR, [place], [place], [place], [place], [place], [place], [place], UROBILINOGEN, NITRITE, LEUKOCYTESUR Sepsis Labs Invalid input(s): PROCALCITONIN,  WBC,  LACTICIDVEN Microbiology Recent Results (from the past 240 hour(s))  Blood Culture (routine x 2)     Status: None (Preliminary result)   Collection Time: 06/06/20  9:49 AM   Specimen: BLOOD  Result Value Ref Range Status   Specimen Description BLOOD RIGHT ANTECUBITAL  Final   Special  Requests   Final    Blood Culture adequate volume BOTTLES DRAWN AEROBIC AND ANAEROBIC   Culture   Final    NO GROWTH < 24 HOURS Performed at [facility], [address] [place], [place] [address]    Report Status PENDING  Incomplete  Resp Panel by RT-PCR (Flu A&B, Covid) Nasopharyngeal Swab     Status: Abnormal   Collection Time: 06/06/20 10:13 AM   Specimen: Nasopharyngeal Swab; Nasopharyngeal(NP) swabs in vial transport medium  Result Value Ref Range Status   SARS Coronavirus 2 by RT PCR POSITIVE (A) NEGATIVE Final    Comment: RESULT CALLED TO, READ BACK BY AND VERIFIED WITH: EASTER,T. RN @1257  06/06/20 BILLINGSLEY,L (NOTE) SARS-CoV-2 target nucleic acids are DETECTED.  The SARS-CoV-2 RNA is generally detectable in upper respiratory specimens during the acute phase of infection. Positive results are indicative of the presence of the identified virus, but do not rule out bacterial infection or co-infection with other pathogens not detected by the test. Clinical correlation with patient history and other diagnostic information is necessary to determine patient infection status. The expected result is Negative.  Fact Sheet for Patients: EntrepreneurPulse.com.au  Fact Sheet for Healthcare Providers: IncredibleEmployment.be  This test is not yet approved or cleared by the [place] FDA and  has been authorized for detection and/or diagnosis of SARS-CoV-2 by FDA under an Emergency Use Authorization (EUA).  This EUA will remain in effect (meaning this test  can be used) for the duration of  the COVID-19 declaration under Section 564(b)(1) of the Act, 21 U.S.C. section 360bbb-3(b)(1), unless the authorization is terminated or revoked sooner.     Influenza A by PCR NEGATIVE NEGATIVE Final   Influenza B by PCR NEGATIVE NEGATIVE Final    Comment: (NOTE) The Xpert Xpress SARS-CoV-2/FLU/RSV plus assay is intended as an aid in the diagnosis of  influenza from Nasopharyngeal swab specimens and should not be used as a sole basis  for treatment. Nasal washings and aspirates are unacceptable for Xpert Xpress SARS-CoV-2/FLU/RSV testing.  Fact Sheet for Patients: EntrepreneurPulse.com.au  Fact Sheet for Healthcare Providers: IncredibleEmployment.be  This test is not yet approved or cleared by the [place] FDA and has been authorized for detection and/or diagnosis of SARS-CoV-2 by FDA under an Emergency Use Authorization (EUA). This EUA will remain in effect (meaning this test can be used) for the duration of the COVID-19 declaration under Section 564(b)(1) of the Act, 21 U.S.C. section 360bbb-3(b)(1), unless the authorization is terminated or revoked.  Performed at [facility], [address], [place], [place] [address]   Blood Culture (routine x 2)     Status: None (Preliminary result)   Collection Time: 06/06/20 11:14 AM   Specimen: BLOOD  Result Value Ref Range Status   Specimen Description BLOOD BLOOD RIGHT HAND  Final   Special Requests   Final    Blood Culture adequate volume BOTTLES DRAWN AEROBIC AND ANAEROBIC   Culture   Final    NO GROWTH < 24 HOURS Performed at [facility], [address], [place], [place] [address]    Report Status PENDING  Incomplete     Time coordinating discharge: 70mins  SIGNED:   Kathie Dike, MD  [facility] 06/07/2020, 8:45 PM   If 7PM-7AM, please contact night-coverage [email or web address]

## 2020-06-07 NOTE — Progress Notes (Signed)
Pt stated that she was able to hold down food and was ready to go. Pt was wheeled to POV in NAD.

## 2020-06-11 LAB — CULTURE, BLOOD (ROUTINE X 2)
Culture: NO GROWTH
Culture: NO GROWTH
Special Requests: ADEQUATE
Special Requests: ADEQUATE

## 2020-06-24 ENCOUNTER — Encounter: Payer: PPO | Admitting: Gastroenterology

## 2020-07-05 DIAGNOSIS — L0201 Cutaneous abscess of face: Secondary | ICD-10-CM | POA: Diagnosis not present

## 2020-07-05 DIAGNOSIS — M1711 Unilateral primary osteoarthritis, right knee: Secondary | ICD-10-CM | POA: Diagnosis not present

## 2020-07-05 DIAGNOSIS — Z0000 Encounter for general adult medical examination without abnormal findings: Secondary | ICD-10-CM | POA: Diagnosis not present

## 2020-07-05 DIAGNOSIS — Z23 Encounter for immunization: Secondary | ICD-10-CM | POA: Diagnosis not present

## 2020-07-05 DIAGNOSIS — R062 Wheezing: Secondary | ICD-10-CM | POA: Diagnosis not present

## 2020-07-05 DIAGNOSIS — G4700 Insomnia, unspecified: Secondary | ICD-10-CM | POA: Diagnosis not present

## 2020-07-05 DIAGNOSIS — K649 Unspecified hemorrhoids: Secondary | ICD-10-CM | POA: Diagnosis not present

## 2020-07-05 DIAGNOSIS — Z6830 Body mass index (BMI) 30.0-30.9, adult: Secondary | ICD-10-CM | POA: Diagnosis not present

## 2020-07-05 DIAGNOSIS — R059 Cough, unspecified: Secondary | ICD-10-CM | POA: Diagnosis not present

## 2020-07-05 DIAGNOSIS — M25511 Pain in right shoulder: Secondary | ICD-10-CM | POA: Diagnosis not present

## 2020-07-05 DIAGNOSIS — J209 Acute bronchitis, unspecified: Secondary | ICD-10-CM | POA: Diagnosis not present

## 2020-07-05 DIAGNOSIS — U099 Post covid-19 condition, unspecified: Secondary | ICD-10-CM | POA: Diagnosis not present

## 2020-07-05 DIAGNOSIS — M25561 Pain in right knee: Secondary | ICD-10-CM | POA: Diagnosis not present

## 2020-07-05 DIAGNOSIS — R609 Edema, unspecified: Secondary | ICD-10-CM | POA: Diagnosis not present

## 2020-07-05 DIAGNOSIS — Z6829 Body mass index (BMI) 29.0-29.9, adult: Secondary | ICD-10-CM | POA: Diagnosis not present

## 2020-07-06 DIAGNOSIS — L304 Erythema intertrigo: Secondary | ICD-10-CM | POA: Diagnosis not present

## 2020-07-06 DIAGNOSIS — Z85820 Personal history of malignant melanoma of skin: Secondary | ICD-10-CM | POA: Diagnosis not present

## 2020-07-06 DIAGNOSIS — L570 Actinic keratosis: Secondary | ICD-10-CM | POA: Diagnosis not present

## 2020-07-12 DIAGNOSIS — R0982 Postnasal drip: Secondary | ICD-10-CM | POA: Diagnosis not present

## 2020-07-12 DIAGNOSIS — J342 Deviated nasal septum: Secondary | ICD-10-CM | POA: Diagnosis not present

## 2020-07-12 DIAGNOSIS — J310 Chronic rhinitis: Secondary | ICD-10-CM | POA: Diagnosis not present

## 2020-07-13 DIAGNOSIS — F411 Generalized anxiety disorder: Secondary | ICD-10-CM | POA: Diagnosis not present

## 2020-07-13 DIAGNOSIS — E782 Mixed hyperlipidemia: Secondary | ICD-10-CM | POA: Diagnosis not present

## 2020-07-13 DIAGNOSIS — I10 Essential (primary) hypertension: Secondary | ICD-10-CM | POA: Diagnosis not present

## 2020-07-13 DIAGNOSIS — K219 Gastro-esophageal reflux disease without esophagitis: Secondary | ICD-10-CM | POA: Diagnosis not present

## 2020-07-13 DIAGNOSIS — G4700 Insomnia, unspecified: Secondary | ICD-10-CM | POA: Diagnosis not present

## 2020-07-13 DIAGNOSIS — K588 Other irritable bowel syndrome: Secondary | ICD-10-CM | POA: Diagnosis not present

## 2020-07-13 DIAGNOSIS — R351 Nocturia: Secondary | ICD-10-CM | POA: Diagnosis not present

## 2020-07-13 DIAGNOSIS — K649 Unspecified hemorrhoids: Secondary | ICD-10-CM | POA: Diagnosis not present

## 2020-07-13 DIAGNOSIS — E871 Hypo-osmolality and hyponatremia: Secondary | ICD-10-CM | POA: Diagnosis not present

## 2020-07-13 DIAGNOSIS — J309 Allergic rhinitis, unspecified: Secondary | ICD-10-CM | POA: Diagnosis not present

## 2020-07-13 DIAGNOSIS — E669 Obesity, unspecified: Secondary | ICD-10-CM | POA: Diagnosis not present

## 2020-07-13 DIAGNOSIS — J4520 Mild intermittent asthma, uncomplicated: Secondary | ICD-10-CM | POA: Diagnosis not present

## 2020-07-27 ENCOUNTER — Ambulatory Visit: Payer: PPO | Admitting: Allergy & Immunology

## 2020-07-27 ENCOUNTER — Other Ambulatory Visit: Payer: Self-pay

## 2020-07-27 VITALS — BP 112/60 | HR 112 | Temp 98.4°F | Resp 16 | Ht 64.0 in | Wt 196.0 lb

## 2020-07-27 DIAGNOSIS — R21 Rash and other nonspecific skin eruption: Secondary | ICD-10-CM | POA: Diagnosis not present

## 2020-07-27 DIAGNOSIS — J310 Chronic rhinitis: Secondary | ICD-10-CM | POA: Diagnosis not present

## 2020-07-27 DIAGNOSIS — J4520 Mild intermittent asthma, uncomplicated: Secondary | ICD-10-CM

## 2020-07-27 NOTE — Patient Instructions (Addendum)
1. Moderate persistent asthma, uncomplicated - Lung testing looks excellent today!  - Continue with the Symbicort twice daily.  - Try to use twice daily every day for the best effect. - I think we are in a good spot.  - Daily controller medication(s): Symbicort 160/4.34mcg one puff twice daily with spacer - Prior to physical activity: albuterol 2 puffs 10-15 minutes before physical activity. - Rescue medications: albuterol 4 puffs every 4-6 hours as needed - Asthma control goals:  * Full participation in all desired activities (may need albuterol before activity) * Albuterol use two time or less a week on average (not counting use with activity) * Cough interfering with sleep two time or less a month * Oral steroids no more than once a year * No hospitalizations  2. Chronic rhinitis - I am glad that your nose is doing well. - We are not going to make any changes. - I am glad that Dr. Benjamine Mola has this under control.  - Continue with: Flonase (fluticasone) one spray per nostril on Mondays, Wednesdays, and Fridays  3. Return in about 6 months (around 01/26/2021).    Please inform us of any Emergency Department visits, hospitalizations, or changes in symptoms. Call us before going to the ED for breathing or allergy symptoms since we might be able to fit you in for a sick visit. Feel free to contact us anytime with any questions, problems, or concerns.  It was a pleasure to see you again today!  Websites that have reliable patient information: 1. American Academy of Asthma, Allergy, and Immunology: www.aaaai.org 2. Food Allergy Research and Education (FARE): foodallergy.org 3. Mothers of Asthmatics: http://www.asthmacommunitynetwork.org 4. American College of Allergy, Asthma, and Immunology: www.acaai.org   COVID-19 Vaccine Information can be found at: ShippingScam.co.uk For questions related to vaccine distribution or appointments,  please email vaccine@ .com or call [phone].   We realize that you might be concerned about having an allergic reaction to the COVID19 vaccines. To help with that concern, WE ARE OFFERING THE COVID19 VACCINES IN OUR OFFICE! Ask the front desk for dates!     "Like" [place] on Facebook and Instagram for our latest updates!      A healthy democracy works best when [employer] participate! Make sure you are registered to vote! If you have moved or changed any of your contact information, you will need to get this updated before voting!  In some cases, you MAY be able to register to vote online: [email or web address]

## 2020-07-27 NOTE — Progress Notes (Signed)
FOLLOW UP  Date of Service/Encounter:  07/27/20   Assessment:   Mild intermittent asthma, uncomplicated  Chronicnon-allergicrhinitis  Food intolerance  Rash  Plan/Recommendations:   1. Moderate persistent asthma, uncomplicated - Lung testing looks excellent today!  - Continue with the Symbicort twice daily.  - Try to use twice daily every day for the best effect. - I think we are in a good spot.  - Daily controller medication(s): Symbicort 160/4.82mcg one puff twice daily with spacer - Prior to physical activity: albuterol 2 puffs 10-15 minutes before physical activity. - Rescue medications: albuterol 4 puffs every 4-6 hours as needed - Asthma control goals:  * Full participation in all desired activities (may need albuterol before activity) * Albuterol use two time or less a week on average (not counting use with activity) * Cough interfering with sleep two time or less a month * Oral steroids no more than once a year * No hospitalizations  2. Chronic rhinitis - I am glad that your nose is doing well. - We are not going to make any changes. - I am glad that Dr. Benjamine Mola has this under control.  - Continue with: Flonase (fluticasone) one spray per nostril on Mondays, Wednesdays, and Fridays  3. Return in about 6 months (around 01/26/2021).   Subjective:   Kendra Orozco is a 75 y.o. female presenting today for follow up of  Chief Complaint  Patient presents with  . Asthma    Kendra Orozco has a history of the following: Patient Active Problem List   Diagnosis Date Noted  . Hyponatremia 06/06/2020  . COVID-19 virus infection 06/06/2020  . HTN (hypertension) 06/06/2020  . Prolapsed internal hemorrhoids, grade 3 05/12/2020  . Constipation 04/15/2013  . Rectal bleeding 02/11/2013  . GERD (gastroesophageal reflux disease) 02/11/2013    History obtained from: chart review and patient.  Kendra Orozco is a 75 y.o. female presenting for a follow up visit.  She  was last seen in January 2022.  At that time, we did not do Lyme testing.  She was doing well on Symbicort 1 puff twice daily as well as albuterol as needed.  We continued with that.  For her rhinitis, we gave her 2 weeks of antibiotics due to an overlying sinusitis.  We felt that an ENT referral can be useful for her due to her rhinitis.  We continue with Flonase 3 times weekly.  She was admitted to the hospital for hyponatremia in mid February. She was there for dehydration and was in the hospital for two days. Her lungs were OK and she was positive to [place]. She had just take a home test and she was having stomach issues with diarrhea from the [place]. She turned a corner and is doing well now.  Asthma/Respiratory Symptom History: She uses her albuterol 2-3 times per week. She is on the Symbicort and this has been controlling her symptoms well. She has skipped her nighttime dosing occasionally. She uses the Breath Right strips at night. She has had three uses of her rescue inhaler in the last month. She has not needed prednisone at all and during her hospitalization, she had no breathing problems at all.   Allergic Rhinitis Symptom History: She is doing well from a nasal perspective. She went to see Dr. Benjamine Mola and she has a follow up appointment in six months. Overall she is very happy with her nose. There was no cauterization necessary.  She is NOT vaccinated, but is interested in doing  so. She was told to wait six months from her monoclonal antibody.   She missed doing fishing. She apparently has a hard time doing this with her arthritis. She would throw out her shoulder if she has to cast a line.   Otherwise, there have been no changes to her past medical history, surgical history, family history, or social history.    Review of Systems  Constitutional: Negative.  Negative for chills, fever, malaise/fatigue and weight loss.  HENT: Negative for congestion, ear discharge, ear pain and sinus  pain.   Eyes: Negative for pain, discharge and redness.  Respiratory: Positive for cough. Negative for sputum production, shortness of breath and wheezing.   Cardiovascular: Positive for palpitations. Negative for chest pain.  Gastrointestinal: Negative for abdominal pain, constipation, diarrhea, heartburn, nausea and vomiting.  Skin: Negative.  Negative for itching and rash.  Neurological: Negative for dizziness and headaches.  Endo/Heme/Allergies: Positive for environmental allergies. Does not bruise/bleed easily.  All other systems reviewed and are negative.      Objective:   Blood pressure 112/60, pulse (!) 112, temperature 98.4 F (36.9 C), temperature source Temporal, resp. rate 16, height 5\' 4"  (1.626 m), weight 196 lb (88.9 kg), SpO2 98 %. Body mass index is 33.64 kg/m.   Physical Exam:  Physical Exam Constitutional:      Appearance: She is well-developed.     Comments: Smiling and pleasant.   HENT:     Head: Normocephalic and atraumatic.     Right Ear: Tympanic membrane, ear canal and external ear normal.     Left Ear: Tympanic membrane, ear canal and external ear normal.     Nose: No nasal deformity, septal deviation, mucosal edema or rhinorrhea.     Right Turbinates: Enlarged and swollen.     Left Turbinates: Enlarged and swollen.     Right Sinus: No maxillary sinus tenderness or frontal sinus tenderness.     Left Sinus: No maxillary sinus tenderness or frontal sinus tenderness.     Mouth/Throat:     Mouth: Mucous membranes are not pale and not dry.     Pharynx: Uvula midline.  Eyes:     General:        Right eye: No discharge.        Left eye: No discharge.     Conjunctiva/sclera: Conjunctivae normal.     Right eye: Right conjunctiva is not injected. No chemosis.    Left eye: Left conjunctiva is not injected. No chemosis.    Pupils: Pupils are equal, round, and reactive to light.  Cardiovascular:     Rate and Rhythm: Normal rate and regular rhythm.      Heart sounds: Normal heart sounds.  Pulmonary:     Effort: Pulmonary effort is normal. No tachypnea, accessory muscle usage or respiratory distress.     Breath sounds: Normal breath sounds. No wheezing, rhonchi or rales.     Comments: Moving air well in all lung fields. No increased work of breathing noted.  Chest:     Chest wall: No tenderness.  Lymphadenopathy:     Cervical: No cervical adenopathy.  Skin:    General: Skin is warm.     Capillary Refill: Capillary refill takes less than 2 seconds.     Coloration: Skin is not pale.     Findings: No abrasion, erythema, petechiae or rash. Rash is not papular, urticarial or vesicular.     Comments: No eczematous or urticarial lesions noted.   Neurological:  Mental Status: She is alert.  Psychiatric:        Behavior: Behavior is cooperative.      Diagnostic studies:    Spirometry: results normal (FEV1: 1.65/77%, FVC: 2.10/74%, FEV1/FVC: 79%).    Spirometry consistent with normal pattern.   Allergy Studies: none        Salvatore Marvel, MD  [facility]

## 2020-07-28 ENCOUNTER — Encounter: Payer: Self-pay | Admitting: Allergy & Immunology

## 2020-08-02 DIAGNOSIS — Z6830 Body mass index (BMI) 30.0-30.9, adult: Secondary | ICD-10-CM | POA: Diagnosis not present

## 2020-08-02 DIAGNOSIS — Z23 Encounter for immunization: Secondary | ICD-10-CM | POA: Diagnosis not present

## 2020-08-02 DIAGNOSIS — M1711 Unilateral primary osteoarthritis, right knee: Secondary | ICD-10-CM | POA: Diagnosis not present

## 2020-08-02 DIAGNOSIS — I10 Essential (primary) hypertension: Secondary | ICD-10-CM | POA: Diagnosis not present

## 2020-08-02 DIAGNOSIS — M25511 Pain in right shoulder: Secondary | ICD-10-CM | POA: Diagnosis not present

## 2020-08-02 DIAGNOSIS — E782 Mixed hyperlipidemia: Secondary | ICD-10-CM | POA: Diagnosis not present

## 2020-08-02 DIAGNOSIS — R351 Nocturia: Secondary | ICD-10-CM | POA: Diagnosis not present

## 2020-08-02 DIAGNOSIS — K588 Other irritable bowel syndrome: Secondary | ICD-10-CM | POA: Diagnosis not present

## 2020-08-02 DIAGNOSIS — E669 Obesity, unspecified: Secondary | ICD-10-CM | POA: Diagnosis not present

## 2020-08-02 DIAGNOSIS — Z6829 Body mass index (BMI) 29.0-29.9, adult: Secondary | ICD-10-CM | POA: Diagnosis not present

## 2020-08-02 DIAGNOSIS — Z0000 Encounter for general adult medical examination without abnormal findings: Secondary | ICD-10-CM | POA: Diagnosis not present

## 2020-08-02 DIAGNOSIS — L0201 Cutaneous abscess of face: Secondary | ICD-10-CM | POA: Diagnosis not present

## 2020-08-02 DIAGNOSIS — J209 Acute bronchitis, unspecified: Secondary | ICD-10-CM | POA: Diagnosis not present

## 2020-08-02 DIAGNOSIS — G4700 Insomnia, unspecified: Secondary | ICD-10-CM | POA: Diagnosis not present

## 2020-08-02 DIAGNOSIS — E871 Hypo-osmolality and hyponatremia: Secondary | ICD-10-CM | POA: Diagnosis not present

## 2020-08-02 DIAGNOSIS — J309 Allergic rhinitis, unspecified: Secondary | ICD-10-CM | POA: Diagnosis not present

## 2020-08-02 DIAGNOSIS — F411 Generalized anxiety disorder: Secondary | ICD-10-CM | POA: Diagnosis not present

## 2020-08-02 DIAGNOSIS — R062 Wheezing: Secondary | ICD-10-CM | POA: Diagnosis not present

## 2020-08-02 DIAGNOSIS — M25561 Pain in right knee: Secondary | ICD-10-CM | POA: Diagnosis not present

## 2020-08-02 DIAGNOSIS — K219 Gastro-esophageal reflux disease without esophagitis: Secondary | ICD-10-CM | POA: Diagnosis not present

## 2020-08-02 DIAGNOSIS — K649 Unspecified hemorrhoids: Secondary | ICD-10-CM | POA: Diagnosis not present

## 2020-08-02 DIAGNOSIS — J4520 Mild intermittent asthma, uncomplicated: Secondary | ICD-10-CM | POA: Diagnosis not present

## 2020-08-14 DIAGNOSIS — I10 Essential (primary) hypertension: Secondary | ICD-10-CM | POA: Diagnosis not present

## 2020-08-14 DIAGNOSIS — K219 Gastro-esophageal reflux disease without esophagitis: Secondary | ICD-10-CM | POA: Diagnosis not present

## 2020-08-24 DIAGNOSIS — M7989 Other specified soft tissue disorders: Secondary | ICD-10-CM | POA: Diagnosis not present

## 2020-08-24 DIAGNOSIS — R5382 Chronic fatigue, unspecified: Secondary | ICD-10-CM | POA: Diagnosis not present

## 2020-08-24 DIAGNOSIS — Z6834 Body mass index (BMI) 34.0-34.9, adult: Secondary | ICD-10-CM | POA: Diagnosis not present

## 2020-08-24 DIAGNOSIS — E790 Hyperuricemia without signs of inflammatory arthritis and tophaceous disease: Secondary | ICD-10-CM | POA: Diagnosis not present

## 2020-08-24 DIAGNOSIS — E669 Obesity, unspecified: Secondary | ICD-10-CM | POA: Diagnosis not present

## 2020-08-24 DIAGNOSIS — R700 Elevated erythrocyte sedimentation rate: Secondary | ICD-10-CM | POA: Diagnosis not present

## 2020-08-24 DIAGNOSIS — R202 Paresthesia of skin: Secondary | ICD-10-CM | POA: Diagnosis not present

## 2020-08-24 DIAGNOSIS — Z8261 Family history of arthritis: Secondary | ICD-10-CM | POA: Diagnosis not present

## 2020-08-24 DIAGNOSIS — M797 Fibromyalgia: Secondary | ICD-10-CM | POA: Diagnosis not present

## 2020-09-15 DIAGNOSIS — M159 Polyosteoarthritis, unspecified: Secondary | ICD-10-CM | POA: Insufficient documentation

## 2020-09-15 DIAGNOSIS — E782 Mixed hyperlipidemia: Secondary | ICD-10-CM | POA: Insufficient documentation

## 2020-09-15 DIAGNOSIS — I10 Essential (primary) hypertension: Secondary | ICD-10-CM | POA: Diagnosis not present

## 2020-09-15 DIAGNOSIS — K581 Irritable bowel syndrome with constipation: Secondary | ICD-10-CM | POA: Diagnosis not present

## 2020-09-15 DIAGNOSIS — F411 Generalized anxiety disorder: Secondary | ICD-10-CM | POA: Diagnosis not present

## 2020-09-15 DIAGNOSIS — E669 Obesity, unspecified: Secondary | ICD-10-CM | POA: Diagnosis not present

## 2020-09-15 DIAGNOSIS — J4530 Mild persistent asthma, uncomplicated: Secondary | ICD-10-CM | POA: Diagnosis not present

## 2020-09-15 DIAGNOSIS — J309 Allergic rhinitis, unspecified: Secondary | ICD-10-CM | POA: Diagnosis not present

## 2020-09-15 DIAGNOSIS — K644 Residual hemorrhoidal skin tags: Secondary | ICD-10-CM | POA: Diagnosis not present

## 2020-09-15 DIAGNOSIS — M545 Low back pain, unspecified: Secondary | ICD-10-CM | POA: Insufficient documentation

## 2020-09-15 DIAGNOSIS — D509 Iron deficiency anemia, unspecified: Secondary | ICD-10-CM | POA: Diagnosis not present

## 2020-09-15 DIAGNOSIS — R7989 Other specified abnormal findings of blood chemistry: Secondary | ICD-10-CM | POA: Diagnosis not present

## 2020-09-15 DIAGNOSIS — K219 Gastro-esophageal reflux disease without esophagitis: Secondary | ICD-10-CM | POA: Diagnosis not present

## 2020-09-15 DIAGNOSIS — D729 Disorder of white blood cells, unspecified: Secondary | ICD-10-CM | POA: Insufficient documentation

## 2020-09-21 DIAGNOSIS — M7989 Other specified soft tissue disorders: Secondary | ICD-10-CM | POA: Diagnosis not present

## 2020-09-21 DIAGNOSIS — R700 Elevated erythrocyte sedimentation rate: Secondary | ICD-10-CM | POA: Diagnosis not present

## 2020-09-21 DIAGNOSIS — E790 Hyperuricemia without signs of inflammatory arthritis and tophaceous disease: Secondary | ICD-10-CM | POA: Diagnosis not present

## 2020-09-21 DIAGNOSIS — E669 Obesity, unspecified: Secondary | ICD-10-CM | POA: Diagnosis not present

## 2020-09-21 DIAGNOSIS — R5382 Chronic fatigue, unspecified: Secondary | ICD-10-CM | POA: Diagnosis not present

## 2020-09-21 DIAGNOSIS — M797 Fibromyalgia: Secondary | ICD-10-CM | POA: Diagnosis not present

## 2020-09-21 DIAGNOSIS — Z8261 Family history of arthritis: Secondary | ICD-10-CM | POA: Diagnosis not present

## 2020-09-21 DIAGNOSIS — Z6834 Body mass index (BMI) 34.0-34.9, adult: Secondary | ICD-10-CM | POA: Diagnosis not present

## 2020-09-21 DIAGNOSIS — R202 Paresthesia of skin: Secondary | ICD-10-CM | POA: Diagnosis not present

## 2020-09-28 ENCOUNTER — Other Ambulatory Visit: Payer: Self-pay

## 2020-09-28 ENCOUNTER — Ambulatory Visit: Payer: PPO | Admitting: Cardiology

## 2020-09-28 ENCOUNTER — Encounter: Payer: Self-pay | Admitting: Cardiology

## 2020-09-28 VITALS — BP 118/72 | HR 82 | Ht 63.5 in | Wt 196.0 lb

## 2020-09-28 DIAGNOSIS — I499 Cardiac arrhythmia, unspecified: Secondary | ICD-10-CM | POA: Diagnosis not present

## 2020-09-28 DIAGNOSIS — I491 Atrial premature depolarization: Secondary | ICD-10-CM

## 2020-09-28 DIAGNOSIS — R0789 Other chest pain: Secondary | ICD-10-CM

## 2020-09-28 NOTE — Patient Instructions (Signed)
Medication Instructions:  Your physician recommends that you continue on your current medications as directed. Please refer to the Current Medication list given to you today.NONE ORDERED  TODAY  *If you need a refill on your cardiac medications before your next appointment, please call your pharmacy*   Lab Work: NONE ORDERED  TODAY   If you have labs (blood work) drawn today and your tests are completely normal, you will receive your results only by: [place] (if you have MyChart) OR A paper copy in the mail If you have any lab test that is abnormal or we need to change your treatment, we will call you to review the results.   Testing/Procedures: NONE ORDERED  TODAY     Follow-Up: At [facility], you and your health needs are our priority.  As part of our continuing mission to provide you with exceptional heart care, we have created designated Provider Care Teams.  These Care Teams include your primary Cardiologist (physician) and Advanced Practice Providers (APPs -  Physician Assistants and Nurse Practitioners) who all work together to provide you with the care you need, when you need it.  We recommend signing up for the patient portal called "MyChart".  Sign up information is provided on this After Visit Summary.  MyChart is used to connect with patients for Virtual Visits (Telemedicine).  Patients are able to view lab/test results, encounter notes, upcoming appointments, etc.  Non-urgent messages can be sent to your provider as well.   To learn more about what you can do with MyChart, go to [email or web address].    Your next appointment:   1 year(s)  The format for your next appointment:   In Person  Provider:   Carlyle Dolly, MD   Other Instructions

## 2020-09-28 NOTE — Progress Notes (Signed)
Clinical Summary Kendra Orozco is a 75 y.o.female seen today for follow up of the following medical problems.   1. Chest pain - at prior visit described squeezing feeling midchest. Tends to occur at rest or exertion. Sometimes with mental stress - occurs 2-3 times a years. Ongoing for about 5 years, unchanged. - lasts about 5 minutes. 8-9/10 in severity. Worst with deep breathing, but no SOB - can be better with leaning forward - no relation to food  CAD risk factors: HTN, age   - chronic symptoms unchanged, somewhat less frequent. 2-3 episodes per year.     Past Medical History:  Diagnosis Date   Angio-edema    Anxiety    Arthritis    Asthma    Cancer ([place])    skin and colon   GERD (gastroesophageal reflux disease)    Gout    Hypertension    Urticaria      Allergies  Allergen Reactions   Pantoprazole Sodium     Constipation, GI pain   Latex Rash   Sulfa Antibiotics Swelling     Current Outpatient Medications  Medication Sig Dispense Refill   benzonatate (TESSALON) 100 MG capsule Take 100 mg by mouth 3 (three) times daily as needed.     Budeson-Glycopyrrol-Formoterol (BREZTRI AEROSPHERE) 160-9-4.8 MCG/ACT AERO Inhale 1-2 puffs into the lungs 2 (two) times daily.     calcium carbonate (OS-CAL) 1250 MG chewable tablet Chew 1 tablet by mouth 2 (two) times a week.      Cholecalciferol ([employer] VIT D 5000 HIGH-POTENCY) 125 MCG (5000 UT) capsule Take 5,000 Units by mouth 2 (two) times a week.     diclofenac (VOLTAREN) 50 MG EC tablet Take 50 mg by mouth 2 (two) times daily.     diphenhydrAMINE (BENADRYL) 25 mg capsule Take 25 mg by mouth daily as needed for allergies.     docusate sodium (COLACE) 250 MG capsule Take 250 mg by mouth daily as needed for constipation.     FLUZONE HIGH-DOSE QUADRIVALENT 0.7 ML SUSY      ibuprofen (ADVIL,MOTRIN) 200 MG tablet Take 200-400 mg by mouth daily as needed.      ketoconazole (NIZORAL) 2 % cream Apply topically every morning.      levocetirizine (XYZAL) 5 MG tablet Take 5 mg by mouth at bedtime.     LORazepam (ATIVAN) 0.5 MG tablet Take 0.5 mg by mouth 2 (two) times daily as needed for anxiety.      lubiprostone (AMITIZA) 24 MCG capsule TAKE ONE CAPSULE BY MOUTH TWICE DAILY WITH A MEAL. Restart on 2/25 60 capsule 5   Menthol-Methyl Salicylate (MUSCLE RUB) 10-15 % CREA Apply 1 application topically as needed for muscle pain.     Multiple Vitamin (MULTIVITAMIN WITH MINERALS) TABS tablet Take 1 tablet by mouth daily.     olmesartan (BENICAR) 40 MG tablet Take 40 mg by mouth every morning.     ondansetron (ZOFRAN) 4 MG tablet Take 1 tablet (4 mg total) by mouth every 6 (six) hours as needed for nausea. 20 tablet 0   Polyvinyl Alcohol-Povidone (REFRESH OP) Apply to eye as needed.     potassium chloride (KLOR-CON) 10 MEQ tablet Take 10 mEq by mouth 2 (two) times daily.     RABEprazole (ACIPHEX) 20 MG tablet Take 1 tablet (20 mg total) by mouth 2 (two) times daily before a meal. 180 tablet 3   sodium chloride 1 g tablet Take 1 tablet (1 g total) by mouth 2 (  two) times daily with a meal. (Patient not taking: Reported on 07/27/2020) 4 tablet 0   torsemide (DEMADEX) 20 MG tablet Take 20 mg by mouth daily.     triamcinolone (KENALOG) 0.1 % SMARTSIG:Topical 1 to 2 Times Daily PRN     Wheat Dextrin (BENEFIBER) POWD Take 1 Package by mouth. 1-2 times per day     No current facility-administered medications for this visit.     Past Surgical History:  Procedure Laterality Date   ABDOMINAL HYSTERECTOMY     fibroid tumors   APPENDECTOMY     BACK SURGERY     BREAST CYST EXCISION Right    CATARACT EXTRACTION W/PHACO Right 11/20/2019   Procedure: CATARACT EXTRACTION PHACO AND INTRAOCULAR LENS PLACEMENT ([place]) RIGHT EYE;  Surgeon: Baruch Goldmann, MD;  Location: [facility];  Service: Ophthalmology;  Laterality: Right;  CDE: 17.30   CATARACT EXTRACTION W/PHACO Left 12/04/2019   Procedure: CATARACT EXTRACTION PHACO AND INTRAOCULAR LENS PLACEMENT  LEFT EYE;  Surgeon: Baruch Goldmann, MD;  Location: [facility];  Service: Ophthalmology;  Laterality: Left;  CDE: 14.59   COLONOSCOPY  09/16/2007   IRJ:JOAC canal hemorrhoids, otherwise normal rectum left-sided diverticula and colonic mucosa appeared normal.   COLONOSCOPY N/A 03/02/2013   Dr. Chauncy Lean adenoma. Colonic diverticulosis. Anal canal hemorrhoids-likely source of hematochezia. Surveillance 2019   COLONOSCOPY WITH PROPOFOL N/A 07/29/2017   Non-bleeding internal hemorrhoids, cecal AVMs.    ESOPHAGOGASTRODUODENOSCOPY  09/16/2007   ZYS:[id number] undulating Z-line, tiny distal esophageal  erosions consistent with mild erosive reflux esophagitis, patulous EG junction, small hiatal hernia, otherwise normal stomach, D1, and D2.   HEMORRHOID SURGERY     X 2      Allergies  Allergen Reactions   Pantoprazole Sodium     Constipation, GI pain   Latex Rash   Sulfa Antibiotics Swelling      Family History  Problem Relation Age of Onset   Colon cancer Brother        65, deceased   Breast cancer Sister 22   Breast cancer Maternal Aunt 28   Heart attack Mother    Allergic rhinitis Mother    Allergic rhinitis Father      Social History Kendra Orozco reports that she has quit smoking. Her smoking use included cigarettes. She has a 0.75 pack-year smoking history. She has never used smokeless tobacco. Kendra Orozco reports current alcohol use.   Review of Systems CONSTITUTIONAL: No weight loss, fever, chills, weakness or fatigue.  HEENT: Eyes: No visual loss, blurred vision, double vision or yellow sclerae.No hearing loss, sneezing, congestion, runny nose or sore throat.  SKIN: No rash or itching.  CARDIOVASCULAR: per hpi RESPIRATORY: No shortness of breath, cough or sputum.  GASTROINTESTINAL: No anorexia, nausea, vomiting or diarrhea. No abdominal pain or blood.  GENITOURINARY: No burning on urination, no polyuria NEUROLOGICAL: No headache, dizziness, syncope, paralysis, ataxia,  numbness or tingling in the extremities. No change in bowel or bladder control.  MUSCULOSKELETAL: No muscle, back pain, joint pain or stiffness.  LYMPHATICS: No enlarged nodes. No history of splenectomy.  PSYCHIATRIC: No history of depression or anxiety.  ENDOCRINOLOGIC: No reports of sweating, cold or heat intolerance. No polyuria or polydipsia.  Marland Kitchen   Physical Examination Today's Vitals   09/28/20 0816  BP: 118/72  Pulse: 82  SpO2: 97%  Weight: 196 lb (88.9 kg)  Height: 5' 3.5" (1.613 m)   Body mass index is 34.18 kg/m.  Gen: resting comfortably, no acute distress HEENT: no scleral icterus, pupils equal  round and reactive, no palptable cervical adenopathy,  CV: irregular, no m/r/g, no jvd Resp: Clear to auscultation bilaterally GI: abdomen is soft, non-tender, non-distended, normal bowel sounds, no hepatosplenomegaly MSK: extremities are warm, no edema.  Skin: warm, no rash Neuro:  no focal deficits Psych: appropriate affect      Assessment and Plan   1. Chest pain - long history of atypical symptoms that are infrequent and unchanged - continue to monitor at this time  2. Irregular heart beat - irregular heart beat on exam, EKG shows SR with PACs.  - mild infrequent symptoms, monitor at this time.    F/u 1 year   Arnoldo Lenis, M.D.

## 2020-10-05 DIAGNOSIS — J019 Acute sinusitis, unspecified: Secondary | ICD-10-CM | POA: Diagnosis not present

## 2020-10-05 DIAGNOSIS — R059 Cough, unspecified: Secondary | ICD-10-CM | POA: Diagnosis not present

## 2020-10-05 DIAGNOSIS — E1165 Type 2 diabetes mellitus with hyperglycemia: Secondary | ICD-10-CM | POA: Diagnosis not present

## 2020-10-05 DIAGNOSIS — I10 Essential (primary) hypertension: Secondary | ICD-10-CM | POA: Diagnosis not present

## 2020-10-06 ENCOUNTER — Other Ambulatory Visit: Payer: Self-pay

## 2020-10-06 MED ORDER — BUDESONIDE-FORMOTEROL FUMARATE 160-4.5 MCG/ACT IN AERO: 2.0000 | INHALATION_SPRAY | Freq: Two times a day (BID) | RESPIRATORY_TRACT | 3 refills | Status: DC

## 2020-10-11 DIAGNOSIS — J310 Chronic rhinitis: Secondary | ICD-10-CM | POA: Diagnosis not present

## 2020-10-11 DIAGNOSIS — J343 Hypertrophy of nasal turbinates: Secondary | ICD-10-CM | POA: Diagnosis not present

## 2020-10-11 DIAGNOSIS — J342 Deviated nasal septum: Secondary | ICD-10-CM | POA: Diagnosis not present

## 2020-10-19 DIAGNOSIS — E790 Hyperuricemia without signs of inflammatory arthritis and tophaceous disease: Secondary | ICD-10-CM | POA: Diagnosis not present

## 2020-10-20 DIAGNOSIS — R051 Acute cough: Secondary | ICD-10-CM | POA: Diagnosis not present

## 2020-10-20 DIAGNOSIS — J029 Acute pharyngitis, unspecified: Secondary | ICD-10-CM | POA: Insufficient documentation

## 2020-10-20 DIAGNOSIS — R0600 Dyspnea, unspecified: Secondary | ICD-10-CM | POA: Insufficient documentation

## 2020-10-20 DIAGNOSIS — U071 COVID-19: Secondary | ICD-10-CM | POA: Diagnosis not present

## 2020-10-20 DIAGNOSIS — R067 Sneezing: Secondary | ICD-10-CM | POA: Insufficient documentation

## 2020-11-13 DIAGNOSIS — I10 Essential (primary) hypertension: Secondary | ICD-10-CM | POA: Diagnosis not present

## 2020-11-13 DIAGNOSIS — E1165 Type 2 diabetes mellitus with hyperglycemia: Secondary | ICD-10-CM | POA: Diagnosis not present

## 2020-12-12 DIAGNOSIS — Z8261 Family history of arthritis: Secondary | ICD-10-CM | POA: Diagnosis not present

## 2020-12-12 DIAGNOSIS — R700 Elevated erythrocyte sedimentation rate: Secondary | ICD-10-CM | POA: Diagnosis not present

## 2020-12-12 DIAGNOSIS — E669 Obesity, unspecified: Secondary | ICD-10-CM | POA: Diagnosis not present

## 2020-12-12 DIAGNOSIS — R5382 Chronic fatigue, unspecified: Secondary | ICD-10-CM | POA: Diagnosis not present

## 2020-12-12 DIAGNOSIS — M25511 Pain in right shoulder: Secondary | ICD-10-CM | POA: Diagnosis not present

## 2020-12-12 DIAGNOSIS — M797 Fibromyalgia: Secondary | ICD-10-CM | POA: Diagnosis not present

## 2020-12-12 DIAGNOSIS — M7989 Other specified soft tissue disorders: Secondary | ICD-10-CM | POA: Diagnosis not present

## 2020-12-12 DIAGNOSIS — R202 Paresthesia of skin: Secondary | ICD-10-CM | POA: Diagnosis not present

## 2020-12-12 DIAGNOSIS — M1A09X0 Idiopathic chronic gout, multiple sites, without tophus (tophi): Secondary | ICD-10-CM | POA: Diagnosis not present

## 2020-12-12 DIAGNOSIS — Z6834 Body mass index (BMI) 34.0-34.9, adult: Secondary | ICD-10-CM | POA: Diagnosis not present

## 2020-12-14 DIAGNOSIS — I10 Essential (primary) hypertension: Secondary | ICD-10-CM | POA: Diagnosis not present

## 2020-12-14 DIAGNOSIS — E1165 Type 2 diabetes mellitus with hyperglycemia: Secondary | ICD-10-CM | POA: Diagnosis not present

## 2020-12-15 DIAGNOSIS — M109 Gout, unspecified: Secondary | ICD-10-CM | POA: Insufficient documentation

## 2020-12-16 DIAGNOSIS — E790 Hyperuricemia without signs of inflammatory arthritis and tophaceous disease: Secondary | ICD-10-CM | POA: Diagnosis not present

## 2020-12-16 DIAGNOSIS — D509 Iron deficiency anemia, unspecified: Secondary | ICD-10-CM | POA: Diagnosis not present

## 2020-12-16 DIAGNOSIS — E669 Obesity, unspecified: Secondary | ICD-10-CM | POA: Diagnosis not present

## 2020-12-22 DIAGNOSIS — E782 Mixed hyperlipidemia: Secondary | ICD-10-CM | POA: Diagnosis not present

## 2020-12-22 DIAGNOSIS — I10 Essential (primary) hypertension: Secondary | ICD-10-CM | POA: Diagnosis not present

## 2020-12-22 DIAGNOSIS — Z0001 Encounter for general adult medical examination with abnormal findings: Secondary | ICD-10-CM | POA: Diagnosis not present

## 2020-12-22 DIAGNOSIS — K581 Irritable bowel syndrome with constipation: Secondary | ICD-10-CM | POA: Diagnosis not present

## 2020-12-22 DIAGNOSIS — K219 Gastro-esophageal reflux disease without esophagitis: Secondary | ICD-10-CM | POA: Diagnosis not present

## 2020-12-22 DIAGNOSIS — K644 Residual hemorrhoidal skin tags: Secondary | ICD-10-CM | POA: Diagnosis not present

## 2020-12-22 DIAGNOSIS — M353 Polymyalgia rheumatica: Secondary | ICD-10-CM | POA: Insufficient documentation

## 2020-12-22 DIAGNOSIS — E669 Obesity, unspecified: Secondary | ICD-10-CM | POA: Diagnosis not present

## 2020-12-22 DIAGNOSIS — R7989 Other specified abnormal findings of blood chemistry: Secondary | ICD-10-CM | POA: Diagnosis not present

## 2020-12-22 DIAGNOSIS — M545 Low back pain, unspecified: Secondary | ICD-10-CM | POA: Diagnosis not present

## 2020-12-22 DIAGNOSIS — F411 Generalized anxiety disorder: Secondary | ICD-10-CM | POA: Diagnosis not present

## 2020-12-22 DIAGNOSIS — J309 Allergic rhinitis, unspecified: Secondary | ICD-10-CM | POA: Diagnosis not present

## 2020-12-22 DIAGNOSIS — J4530 Mild persistent asthma, uncomplicated: Secondary | ICD-10-CM | POA: Diagnosis not present

## 2020-12-27 ENCOUNTER — Other Ambulatory Visit (HOSPITAL_COMMUNITY): Payer: Self-pay | Admitting: Internal Medicine

## 2020-12-27 DIAGNOSIS — Z1231 Encounter for screening mammogram for malignant neoplasm of breast: Secondary | ICD-10-CM

## 2021-01-05 ENCOUNTER — Ambulatory Visit (HOSPITAL_COMMUNITY)
Admission: RE | Admit: 2021-01-05 | Discharge: 2021-01-05 | Disposition: A | Discharge status: Home / Self Care | Payer: PPO | Source: Ambulatory Visit | Attending: Internal Medicine | Admitting: Internal Medicine

## 2021-01-05 ENCOUNTER — Other Ambulatory Visit: Payer: Self-pay

## 2021-01-05 DIAGNOSIS — Z1231 Encounter for screening mammogram for malignant neoplasm of breast: Secondary | ICD-10-CM | POA: Insufficient documentation

## 2021-01-13 DIAGNOSIS — I10 Essential (primary) hypertension: Secondary | ICD-10-CM | POA: Diagnosis not present

## 2021-01-13 DIAGNOSIS — E1165 Type 2 diabetes mellitus with hyperglycemia: Secondary | ICD-10-CM | POA: Diagnosis not present

## 2021-02-07 DIAGNOSIS — R7989 Other specified abnormal findings of blood chemistry: Secondary | ICD-10-CM | POA: Diagnosis not present

## 2021-02-07 DIAGNOSIS — E669 Obesity, unspecified: Secondary | ICD-10-CM | POA: Diagnosis not present

## 2021-02-07 DIAGNOSIS — I10 Essential (primary) hypertension: Secondary | ICD-10-CM | POA: Diagnosis not present

## 2021-02-07 DIAGNOSIS — E790 Hyperuricemia without signs of inflammatory arthritis and tophaceous disease: Secondary | ICD-10-CM | POA: Diagnosis not present

## 2021-02-07 DIAGNOSIS — D509 Iron deficiency anemia, unspecified: Secondary | ICD-10-CM | POA: Diagnosis not present

## 2021-02-07 DIAGNOSIS — M353 Polymyalgia rheumatica: Secondary | ICD-10-CM | POA: Diagnosis not present

## 2021-02-07 DIAGNOSIS — M159 Polyosteoarthritis, unspecified: Secondary | ICD-10-CM | POA: Diagnosis not present

## 2021-02-13 DIAGNOSIS — I10 Essential (primary) hypertension: Secondary | ICD-10-CM | POA: Diagnosis not present

## 2021-02-13 DIAGNOSIS — E1165 Type 2 diabetes mellitus with hyperglycemia: Secondary | ICD-10-CM | POA: Diagnosis not present

## 2021-03-15 DIAGNOSIS — E669 Obesity, unspecified: Secondary | ICD-10-CM | POA: Diagnosis not present

## 2021-03-15 DIAGNOSIS — M1A09X0 Idiopathic chronic gout, multiple sites, without tophus (tophi): Secondary | ICD-10-CM | POA: Diagnosis not present

## 2021-03-15 DIAGNOSIS — I10 Essential (primary) hypertension: Secondary | ICD-10-CM | POA: Diagnosis not present

## 2021-03-15 DIAGNOSIS — R5382 Chronic fatigue, unspecified: Secondary | ICD-10-CM | POA: Diagnosis not present

## 2021-03-15 DIAGNOSIS — Z6835 Body mass index (BMI) 35.0-35.9, adult: Secondary | ICD-10-CM | POA: Diagnosis not present

## 2021-03-15 DIAGNOSIS — E1165 Type 2 diabetes mellitus with hyperglycemia: Secondary | ICD-10-CM | POA: Diagnosis not present

## 2021-03-15 DIAGNOSIS — M797 Fibromyalgia: Secondary | ICD-10-CM | POA: Diagnosis not present

## 2021-03-28 DIAGNOSIS — J343 Hypertrophy of nasal turbinates: Secondary | ICD-10-CM | POA: Diagnosis not present

## 2021-03-28 DIAGNOSIS — J342 Deviated nasal septum: Secondary | ICD-10-CM | POA: Diagnosis not present

## 2021-03-28 DIAGNOSIS — J310 Chronic rhinitis: Secondary | ICD-10-CM | POA: Diagnosis not present

## 2021-04-14 DIAGNOSIS — E1165 Type 2 diabetes mellitus with hyperglycemia: Secondary | ICD-10-CM | POA: Diagnosis not present

## 2021-04-14 DIAGNOSIS — I10 Essential (primary) hypertension: Secondary | ICD-10-CM | POA: Diagnosis not present

## 2021-05-16 DIAGNOSIS — E1165 Type 2 diabetes mellitus with hyperglycemia: Secondary | ICD-10-CM | POA: Diagnosis not present

## 2021-05-16 DIAGNOSIS — I10 Essential (primary) hypertension: Secondary | ICD-10-CM | POA: Diagnosis not present

## 2021-06-09 DIAGNOSIS — R110 Nausea: Secondary | ICD-10-CM | POA: Insufficient documentation

## 2021-06-13 DIAGNOSIS — I10 Essential (primary) hypertension: Secondary | ICD-10-CM | POA: Diagnosis not present

## 2021-06-13 DIAGNOSIS — E1165 Type 2 diabetes mellitus with hyperglycemia: Secondary | ICD-10-CM | POA: Diagnosis not present

## 2021-06-19 DIAGNOSIS — D509 Iron deficiency anemia, unspecified: Secondary | ICD-10-CM | POA: Diagnosis not present

## 2021-06-19 DIAGNOSIS — E669 Obesity, unspecified: Secondary | ICD-10-CM | POA: Diagnosis not present

## 2021-06-23 DIAGNOSIS — I10 Essential (primary) hypertension: Secondary | ICD-10-CM | POA: Diagnosis not present

## 2021-06-23 DIAGNOSIS — K219 Gastro-esophageal reflux disease without esophagitis: Secondary | ICD-10-CM | POA: Diagnosis not present

## 2021-06-23 DIAGNOSIS — E669 Obesity, unspecified: Secondary | ICD-10-CM | POA: Diagnosis not present

## 2021-06-23 DIAGNOSIS — F411 Generalized anxiety disorder: Secondary | ICD-10-CM | POA: Diagnosis not present

## 2021-06-23 DIAGNOSIS — J309 Allergic rhinitis, unspecified: Secondary | ICD-10-CM | POA: Diagnosis not present

## 2021-06-23 DIAGNOSIS — R7989 Other specified abnormal findings of blood chemistry: Secondary | ICD-10-CM | POA: Diagnosis not present

## 2021-06-23 DIAGNOSIS — K581 Irritable bowel syndrome with constipation: Secondary | ICD-10-CM | POA: Diagnosis not present

## 2021-06-23 DIAGNOSIS — J4530 Mild persistent asthma, uncomplicated: Secondary | ICD-10-CM | POA: Diagnosis not present

## 2021-06-23 DIAGNOSIS — K644 Residual hemorrhoidal skin tags: Secondary | ICD-10-CM | POA: Diagnosis not present

## 2021-06-23 DIAGNOSIS — D509 Iron deficiency anemia, unspecified: Secondary | ICD-10-CM | POA: Diagnosis not present

## 2021-06-23 DIAGNOSIS — M545 Low back pain, unspecified: Secondary | ICD-10-CM | POA: Diagnosis not present

## 2021-06-23 DIAGNOSIS — E782 Mixed hyperlipidemia: Secondary | ICD-10-CM | POA: Diagnosis not present

## 2021-07-05 DIAGNOSIS — L570 Actinic keratosis: Secondary | ICD-10-CM | POA: Diagnosis not present

## 2021-07-05 DIAGNOSIS — Z85828 Personal history of other malignant neoplasm of skin: Secondary | ICD-10-CM | POA: Diagnosis not present

## 2021-07-05 DIAGNOSIS — Z85820 Personal history of malignant melanoma of skin: Secondary | ICD-10-CM | POA: Diagnosis not present

## 2021-07-14 DIAGNOSIS — E785 Hyperlipidemia, unspecified: Secondary | ICD-10-CM | POA: Diagnosis not present

## 2021-07-14 DIAGNOSIS — I10 Essential (primary) hypertension: Secondary | ICD-10-CM | POA: Diagnosis not present

## 2021-08-01 ENCOUNTER — Other Ambulatory Visit (HOSPITAL_COMMUNITY): Payer: Self-pay | Admitting: Family Medicine

## 2021-08-01 DIAGNOSIS — M25561 Pain in right knee: Secondary | ICD-10-CM

## 2021-08-01 DIAGNOSIS — J4530 Mild persistent asthma, uncomplicated: Secondary | ICD-10-CM | POA: Diagnosis not present

## 2021-08-01 DIAGNOSIS — B370 Candidal stomatitis: Secondary | ICD-10-CM | POA: Diagnosis not present

## 2021-08-09 ENCOUNTER — Ambulatory Visit: Payer: PPO | Admitting: Allergy & Immunology

## 2021-08-09 ENCOUNTER — Encounter: Payer: Self-pay | Admitting: Allergy & Immunology

## 2021-08-09 VITALS — BP 120/76 | HR 89 | Temp 97.5°F | Resp 16 | Ht 63.5 in

## 2021-08-09 DIAGNOSIS — J310 Chronic rhinitis: Secondary | ICD-10-CM

## 2021-08-09 DIAGNOSIS — R21 Rash and other nonspecific skin eruption: Secondary | ICD-10-CM | POA: Diagnosis not present

## 2021-08-09 DIAGNOSIS — K9049 Malabsorption due to intolerance, not elsewhere classified: Secondary | ICD-10-CM

## 2021-08-09 DIAGNOSIS — J4540 Moderate persistent asthma, uncomplicated: Secondary | ICD-10-CM | POA: Diagnosis not present

## 2021-08-09 MED ORDER — NYSTATIN 100000 UNIT/ML MT SUSP: 5.0000 mL | Freq: Four times a day (QID) | OROMUCOSAL | 0 refills | Status: DC

## 2021-08-09 MED ORDER — BUDESONIDE-FORMOTEROL FUMARATE 160-4.5 MCG/ACT IN AERO: 2.0000 | INHALATION_SPRAY | Freq: Two times a day (BID) | RESPIRATORY_TRACT | 5 refills | Status: DC

## 2021-08-09 NOTE — Patient Instructions (Addendum)
1. Moderate persistent asthma, uncomplicated - with oral candidiasis ?- Lung testing not done today. ?- We are going to jump through hoops to get your back on the Symbicort (from the Advair). ?- The Advair is notorious for causing hoarseness and oral yeast infections.  ?- Start nystatin suspension swish and spit 4 times daily for one week. ?- Daily controller medication(s): Symbicort 160/4.20mg one puff twice daily with spacer ?- Prior to physical activity: albuterol 2 puffs 10-15 minutes before physical activity. ?- Rescue medications: albuterol 4 puffs every 4-6 hours as needed ?- Asthma control goals:  ?* Full participation in all desired activities (may need albuterol before activity) ?* Albuterol use two time or less a week on average (not counting use with activity) ?* Cough interfering with sleep two time or less a month ?* Oral steroids no more than once a year ?* No hospitalizations ? ?2. Chronic rhinitis ?- I am glad that your nose is doing well. ?- We are not going to make any changes. ?- We will see what Dr. TBenjamine Molarecommends for long term control.  ?- Continue with: ipratropium one spray per nostril twice daily as needed like you are doing.  ? ?3. Return in about 6 months (around 02/08/2022).  ? ? ?Please inform [place] any Emergency Department visits, hospitalizations, or changes in symptoms. Call [place]efore going to the ED for breathing or allergy symptoms since we might be able to fit you in for a sick visit. Feel free to contact u[place]anytime with any questions, problems, or concerns. ? ?It was a pleasure to see you again today! ? ?Websites that have reliable patient information: ?1. American Academy of Asthma, Allergy, and Immunology: www.aaaai.org ?2. Food Allergy Research and Education (FARE): foodallergy.org ?3. Mothers of Asthmatics: http://www.asthmacommunitynetwork.org ?4. [employer] Allergy, Asthma, and Immunology: [email or web address].co.uk? ? ?COVID-19 Vaccine Information can be found at:  [email or web address] questions related to vaccine distribution or appointments, please email [email or web address] or call [phone]  ? ?We realize that you might be concerned about having an allergic reaction to the COVID19 vaccines. To help with that concern, WE ARE OFFERING THE COVID19 VACCINES IN OUR OFFICE! Ask the front desk for dates!  ? ? ? ??Like? u[place]on Facebook and Instagram for our latest updates!  ?  ? ? ?A healthy democracy works best when ANew [employer] Life Insuranceparticipate! Make sure you are registered to vote! If you have moved or changed any of your contact information, you will need to get this updated before voting! ? ?In some cases, you MAY be able to register to vote online: [email or web address]? ? ? ? ?

## 2021-08-09 NOTE — Progress Notes (Signed)
? ?FOLLOW UP ? ?Date of Service/Encounter:  08/09/21 ? ? ?Assessment:  ? ?Mild intermittent asthma, uncomplicated ?  ?Chronic non-allergic rhinitis ?  ?Food intolerance ?  ?Rash ? ?Plan/Recommendations:  ? ?1. Moderate persistent asthma, uncomplicated - with oral candidiasis ?- Lung testing not done today. ?- We are going to jump through hoops to get your back on the Symbicort (from the Advair). ?- The Advair is notorious for causing hoarseness and oral yeast infections.  ?- Start nystatin suspension swish and spit 4 times daily for one week. ?- Daily controller medication(s): Symbicort 160/4.27mg one puff twice daily with spacer ?- Prior to physical activity: albuterol 2 puffs 10-15 minutes before physical activity. ?- Rescue medications: albuterol 4 puffs every 4-6 hours as needed ?- Asthma control goals:  ?* Full participation in all desired activities (may need albuterol before activity) ?* Albuterol use two time or less a week on average (not counting use with activity) ?* Cough interfering with sleep two time or less a month ?* Oral steroids no more than once a year ?* No hospitalizations ? ?2. Chronic rhinitis ?- I am glad that your nose is doing well. ?- We are not going to make any changes. ?- We will see what Dr. TBenjamine Molarecommends for long term control.  ?- Continue with: ipratropium one spray per nostril twice daily as needed like you are doing.  ? ?3. Return in about 6 months (around 02/08/2022).  ? ? ? ?Subjective:  ? ?Kendra EBNERis a 76y.o. female presenting today for follow up of  ?Chief Complaint  ?Patient presents with  ? Asthma  ?  Difficulty breathing and took 5 puffs of her rescue inhaler to get back right. Insurance company wants her to stop the Symbicort inhaler. fluticasone propionate diskus has caused all her issues   ? Other  ?  Had a fungle infection in her mouth - yeast infection in her throat and some issues with her lips.   ? Angioedema  ?  Tongue and around her bronchial tubes -  due to the powder inhaler   ? ? ?Kendra MCKAMEYhas a history of the following: ?Patient Active Problem List  ? Diagnosis Date Noted  ? Hyponatremia 06/06/2020  ? COVID-19 virus infection 06/06/2020  ? HTN (hypertension) 06/06/2020  ? Prolapsed internal hemorrhoids, grade 3 05/12/2020  ? Constipation 04/15/2013  ? Rectal bleeding 02/11/2013  ? GERD (gastroesophageal reflux disease) 02/11/2013  ? ? ?History obtained from: chart review and patient. ? ?RKinslyis a 76y.o. female presenting for a follow up visit.  She was last seen in April 2022.  At that time, her lung testing looked excellent.  We continued with Symbicort 1 puff twice daily.  She was doing very well on this regimen.  For her rhinitis, her nose was under good control with Flonase 3 times a week.  She was not using a nasal antihistamine or an oral antihistamine. ? ?Since last visit, she has mostly done well. ? ?Asthma/Respiratory Symptom History: She is on Advair 250/50 one puff twice daily. She thinks that she has bene having a lot of hoarseness with it.  She would have problems with breaking tout in her mouth and was diagnosed with thrush. She report a lot of pain with this especially with eating or drinking or anything at all. She has been using yogurt and that has been helping. Throat is still wore. She has been on the Advair for two months (changed from Symbicort). She  was placed on prednisone by an NP for her knee.  ? ?Allergic Rhinitis Symptom History: She has a visit with Dr. Benjamine Mola in one or two months.  She is not sure what his long-term plan is.  She remains on the Flonase.  She has not needed antibiotics. ? ? ?Otherwise, there have been no changes to her past medical history, surgical history, family history, or social history. ? ? ? ?Review of Systems  ?Constitutional: Negative.  Negative for chills, fever, malaise/fatigue and weight loss.  ?HENT:  Negative for congestion, ear discharge, ear pain and sinus pain.   ?Eyes:  Negative for pain,  discharge and redness.  ?Respiratory:  Positive for cough. Negative for sputum production, shortness of breath and wheezing.   ?Cardiovascular:  Positive for palpitations. Negative for chest pain.  ?Gastrointestinal:  Negative for abdominal pain, constipation, diarrhea, heartburn, nausea and vomiting.  ?Skin: Negative.  Negative for itching and rash.  ?Neurological:  Negative for dizziness and headaches.  ?Endo/Heme/Allergies:  Positive for environmental allergies. Does not bruise/bleed easily.  ?All other systems reviewed and are negative.  ? ? ? ?Objective:  ? ?Blood pressure 120/76, pulse 89, temperature (!) 97.5 ?F (36.4 ?C), resp. rate 16, height 5' 3.5" (1.613 m), SpO2 97 %. ?Body mass index is 34.18 kg/m?. ? ? ? ?Physical Exam ?Vitals reviewed.  ?Constitutional:   ?   Appearance: She is well-developed.  ?   Comments: Smiling and pleasant.   ?HENT:  ?   Head: Normocephalic and atraumatic.  ?   Right Ear: Tympanic membrane, ear canal and external ear normal.  ?   Left Ear: Tympanic membrane, ear canal and external ear normal.  ?   Nose: No nasal deformity, septal deviation, mucosal edema or rhinorrhea.  ?   Right Turbinates: Enlarged and swollen.  ?   Left Turbinates: Enlarged and swollen.  ?   Right Sinus: No maxillary sinus tenderness or frontal sinus tenderness.  ?   Left Sinus: No maxillary sinus tenderness or frontal sinus tenderness.  ?   Mouth/Throat:  ?   Mouth: Mucous membranes are not pale and not dry.  ?   Pharynx: Uvula midline.  ?Eyes:  ?   General:     ?   Right eye: No discharge.     ?   Left eye: No discharge.  ?   Conjunctiva/sclera: Conjunctivae normal.  ?   Right eye: Right conjunctiva is not injected. No chemosis. ?   Left eye: Left conjunctiva is not injected. No chemosis. ?   Pupils: Pupils are equal, round, and reactive to light.  ?Cardiovascular:  ?   Rate and Rhythm: Normal rate and regular rhythm.  ?   Heart sounds: Normal heart sounds.  ?Pulmonary:  ?   Effort: Pulmonary effort is  normal. No tachypnea, accessory muscle usage or respiratory distress.  ?   Breath sounds: Normal breath sounds. No wheezing, rhonchi or rales.  ?   Comments: Moving air well in all lung fields. No increased work of breathing noted.  ?Chest:  ?   Chest wall: No tenderness.  ?Lymphadenopathy:  ?   Cervical: No cervical adenopathy.  ?Skin: ?   General: Skin is warm.  ?   Capillary Refill: Capillary refill takes less than 2 seconds.  ?   Coloration: Skin is not pale.  ?   Findings: No abrasion, erythema, petechiae or rash. Rash is not papular, urticarial or vesicular.  ?   Comments: No eczematous or urticarial lesions noted.   ?  Neurological:  ?   Mental Status: She is alert.  ?Psychiatric:     ?   Behavior: Behavior is cooperative.  ?  ? ?Diagnostic studies: none ? ? ? ? ?  ?Salvatore Marvel, MD  ?[facility] ? ? ? ? ? ? ?

## 2021-08-10 ENCOUNTER — Encounter: Payer: Self-pay | Admitting: Allergy & Immunology

## 2021-08-11 MED ORDER — SYMBICORT 160-4.5 MCG/ACT IN AERO: 2.0000 | INHALATION_SPRAY | Freq: Two times a day (BID) | RESPIRATORY_TRACT | 11 refills | Status: DC

## 2021-08-11 NOTE — Addendum Note (Signed)
Addended by: Herbie Drape on: 08/11/2021 12:32 PM ? ? Modules accepted: Orders ? ?

## 2021-08-13 DIAGNOSIS — K219 Gastro-esophageal reflux disease without esophagitis: Secondary | ICD-10-CM | POA: Diagnosis not present

## 2021-08-13 DIAGNOSIS — E782 Mixed hyperlipidemia: Secondary | ICD-10-CM | POA: Diagnosis not present

## 2021-08-13 DIAGNOSIS — I10 Essential (primary) hypertension: Secondary | ICD-10-CM | POA: Diagnosis not present

## 2021-09-13 DIAGNOSIS — K219 Gastro-esophageal reflux disease without esophagitis: Secondary | ICD-10-CM | POA: Diagnosis not present

## 2021-09-13 DIAGNOSIS — E782 Mixed hyperlipidemia: Secondary | ICD-10-CM | POA: Diagnosis not present

## 2021-09-13 DIAGNOSIS — Z6835 Body mass index (BMI) 35.0-35.9, adult: Secondary | ICD-10-CM | POA: Diagnosis not present

## 2021-09-13 DIAGNOSIS — M1A09X0 Idiopathic chronic gout, multiple sites, without tophus (tophi): Secondary | ICD-10-CM | POA: Diagnosis not present

## 2021-09-13 DIAGNOSIS — R5382 Chronic fatigue, unspecified: Secondary | ICD-10-CM | POA: Diagnosis not present

## 2021-09-13 DIAGNOSIS — M797 Fibromyalgia: Secondary | ICD-10-CM | POA: Diagnosis not present

## 2021-09-13 DIAGNOSIS — E669 Obesity, unspecified: Secondary | ICD-10-CM | POA: Diagnosis not present

## 2021-09-13 DIAGNOSIS — I10 Essential (primary) hypertension: Secondary | ICD-10-CM | POA: Diagnosis not present

## 2021-09-27 DIAGNOSIS — J342 Deviated nasal septum: Secondary | ICD-10-CM | POA: Diagnosis not present

## 2021-09-27 DIAGNOSIS — J310 Chronic rhinitis: Secondary | ICD-10-CM | POA: Diagnosis not present

## 2021-09-27 DIAGNOSIS — J343 Hypertrophy of nasal turbinates: Secondary | ICD-10-CM | POA: Diagnosis not present

## 2021-11-01 DIAGNOSIS — E669 Obesity, unspecified: Secondary | ICD-10-CM | POA: Diagnosis not present

## 2021-11-01 DIAGNOSIS — D509 Iron deficiency anemia, unspecified: Secondary | ICD-10-CM | POA: Diagnosis not present

## 2021-11-01 DIAGNOSIS — E790 Hyperuricemia without signs of inflammatory arthritis and tophaceous disease: Secondary | ICD-10-CM | POA: Diagnosis not present

## 2021-11-08 DIAGNOSIS — J4530 Mild persistent asthma, uncomplicated: Secondary | ICD-10-CM | POA: Diagnosis not present

## 2021-11-08 DIAGNOSIS — E669 Obesity, unspecified: Secondary | ICD-10-CM | POA: Diagnosis not present

## 2021-11-08 DIAGNOSIS — F411 Generalized anxiety disorder: Secondary | ICD-10-CM | POA: Diagnosis not present

## 2021-11-08 DIAGNOSIS — N1831 Chronic kidney disease, stage 3a: Secondary | ICD-10-CM | POA: Insufficient documentation

## 2021-11-08 DIAGNOSIS — J310 Chronic rhinitis: Secondary | ICD-10-CM | POA: Diagnosis not present

## 2021-11-08 DIAGNOSIS — M25561 Pain in right knee: Secondary | ICD-10-CM | POA: Insufficient documentation

## 2021-11-08 DIAGNOSIS — L732 Hidradenitis suppurativa: Secondary | ICD-10-CM | POA: Insufficient documentation

## 2021-11-08 DIAGNOSIS — K644 Residual hemorrhoidal skin tags: Secondary | ICD-10-CM | POA: Diagnosis not present

## 2021-11-08 DIAGNOSIS — M545 Low back pain, unspecified: Secondary | ICD-10-CM | POA: Diagnosis not present

## 2021-11-08 DIAGNOSIS — I10 Essential (primary) hypertension: Secondary | ICD-10-CM | POA: Diagnosis not present

## 2021-11-08 DIAGNOSIS — E782 Mixed hyperlipidemia: Secondary | ICD-10-CM | POA: Diagnosis not present

## 2021-11-08 DIAGNOSIS — R7989 Other specified abnormal findings of blood chemistry: Secondary | ICD-10-CM | POA: Diagnosis not present

## 2021-11-08 DIAGNOSIS — K219 Gastro-esophageal reflux disease without esophagitis: Secondary | ICD-10-CM | POA: Diagnosis not present

## 2021-11-08 DIAGNOSIS — D509 Iron deficiency anemia, unspecified: Secondary | ICD-10-CM | POA: Diagnosis not present

## 2021-11-08 DIAGNOSIS — K581 Irritable bowel syndrome with constipation: Secondary | ICD-10-CM | POA: Diagnosis not present

## 2021-11-08 DIAGNOSIS — R1011 Right upper quadrant pain: Secondary | ICD-10-CM | POA: Insufficient documentation

## 2021-11-13 DIAGNOSIS — E782 Mixed hyperlipidemia: Secondary | ICD-10-CM | POA: Diagnosis not present

## 2021-11-13 DIAGNOSIS — K219 Gastro-esophageal reflux disease without esophagitis: Secondary | ICD-10-CM | POA: Diagnosis not present

## 2021-11-13 DIAGNOSIS — I10 Essential (primary) hypertension: Secondary | ICD-10-CM | POA: Diagnosis not present

## 2021-12-08 DIAGNOSIS — N1831 Chronic kidney disease, stage 3a: Secondary | ICD-10-CM | POA: Diagnosis not present

## 2021-12-13 NOTE — H&P (View-Only) (Signed)
Referring Provider: Celene Squibb, MD Primary Care Physician:  Celene Squibb, MD Primary GI Physician: Dr. Gala Romney  Chief Complaint  Patient presents with   Constipation    Constipation and hemorrhoids. A lot of burping and hiccups     HPI:   Kendra Orozco is a 76 y.o. female with history of constipation, hemorrhoids GERD, early satiety with normal gastric emptying study in 2021.  Had offered EGD in 2021 to further evaluate upper GI symptoms but patient declined.  We have not seen patient since January 2022 when she was seen for hemorrhoid banding and underwent banding of left lateral internal hemorrhoid.  Today:   Constipation:  Bms once a day. Stools are soft for the most part. Taking benefiber BID. Also taking Amitiza 24 mcg BID. Colace as needed. Has been needing colace more frequently over the last 3 months, 2-3 times a week. Intermittent lower abdominal discomfort. No rectal bleeding unless hemorrhoids flare, but hasn't had this is a very long time. No black stool.   Doesn't want to change Amitiza.   Also having more reflux over the last 3 months. Worse at night when asleep. Taking aciphex twice daily. Breakthrough every other day. Also with daytime symptoms.Trying to limit fried/fatty foods and spicy foods.  Increased belching and hiccups. Occasional nausea, but not routine. No vomiting. Feels solid foods are not going down all the way. Occasional regurgitation.   No recent medication changes.   Ibuprofen for back pain occasionally. 1-2 doses every couple of weeks. Takes 2 at a time. Primarily using tylenol.   No unintentional weight loss.   Past Medical History:  Diagnosis Date   Angio-edema    Anxiety    Arthritis    Asthma    Cancer ([place])    skin and colon   GERD (gastroesophageal reflux disease)    Gout    Hypertension    Urticaria     Past Surgical History:  Procedure Laterality Date   ABDOMINAL HYSTERECTOMY     fibroid tumors   APPENDECTOMY     BACK  SURGERY     BREAST CYST EXCISION Right    CATARACT EXTRACTION W/PHACO Right 11/20/2019   Procedure: CATARACT EXTRACTION PHACO AND INTRAOCULAR LENS PLACEMENT ([place]) RIGHT EYE;  Surgeon: Baruch Goldmann, MD;  Location: [facility];  Service: Ophthalmology;  Laterality: Right;  CDE: 17.30   CATARACT EXTRACTION W/PHACO Left 12/04/2019   Procedure: CATARACT EXTRACTION PHACO AND INTRAOCULAR LENS PLACEMENT LEFT EYE;  Surgeon: Baruch Goldmann, MD;  Location: [facility];  Service: Ophthalmology;  Laterality: Left;  CDE: 14.59   COLONOSCOPY  09/16/2007   QQP:YPPJ canal hemorrhoids, otherwise normal rectum left-sided diverticula and colonic mucosa appeared normal.   COLONOSCOPY N/A 03/02/2013   Dr. Chauncy Lean adenoma. Colonic diverticulosis. Anal canal hemorrhoids-likely source of hematochezia. Surveillance 2019   COLONOSCOPY WITH PROPOFOL N/A 07/29/2017   Non-bleeding internal hemorrhoids, cecal AVMs.    ESOPHAGOGASTRODUODENOSCOPY  09/16/2007   [id number]:[id number] undulating Z-line, tiny distal esophageal  erosions consistent with mild erosive reflux esophagitis, patulous EG junction, small hiatal hernia, otherwise normal stomach, D1, and D2.   HEMORRHOID SURGERY     X 2     Current Outpatient Medications  Medication Sig Dispense Refill   allopurinol (ZYLOPRIM) 100 MG tablet Take 100 mg by mouth daily.     Budeson-Glycopyrrol-Formoterol (BREZTRI AEROSPHERE) 160-9-4.8 MCG/ACT AERO Inhale 1-2 puffs into the lungs 2 (two) times daily.     budesonide-formoterol (SYMBICORT) 160-4.5 MCG/ACT inhaler Inhale 2 puffs into  the lungs 2 (two) times daily. 1 each 3   calcium carbonate (OS-CAL) 1250 MG chewable tablet Chew 1 tablet by mouth 2 (two) times a week.      dexlansoprazole (DEXILANT) 60 MG capsule Take 1 capsule (60 mg total) by mouth daily. 30 capsule 3   docusate sodium (COLACE) 250 MG capsule Take 250 mg by mouth daily as needed for constipation.     FLUZONE HIGH-DOSE QUADRIVALENT 0.7 ML SUSY      ibuprofen  (ADVIL,MOTRIN) 200 MG tablet Take 200-400 mg by mouth daily as needed.      ketoconazole (NIZORAL) 2 % cream Apply topically as needed.     levocetirizine (XYZAL) 5 MG tablet Take 5 mg by mouth at bedtime.     LORazepam (ATIVAN) 0.5 MG tablet Take 0.5 mg by mouth 2 (two) times daily as needed for anxiety.      lubiprostone (AMITIZA) 24 MCG capsule Take 24 mcg by mouth 2 (two) times daily with a meal.     Multiple Vitamin (MULTIVITAMIN WITH MINERALS) TABS tablet Take 1 tablet by mouth daily.     OLMESARTAN MEDOXOMIL PO Take 40 mg by mouth daily.     ondansetron (ZOFRAN) 4 MG tablet Take 1 tablet (4 mg total) by mouth every 6 (six) hours as needed for nausea. 20 tablet 0   Polyvinyl Alcohol-Povidone (REFRESH OP) Apply to eye as needed.     potassium chloride (KLOR-CON) 10 MEQ tablet Take 10 mEq by mouth daily.     SYMBICORT 160-4.5 MCG/ACT inhaler Inhale 2 puffs into the lungs 2 (two) times daily. 10.2 g 11   torsemide (DEMADEX) 20 MG tablet Take 20 mg by mouth daily.     triamcinolone (KENALOG) 0.1 % SMARTSIG:Topical 1 to 2 Times Daily PRN     Wheat Dextrin (BENEFIBER) POWD Take 1 Package by mouth. 1-2 times per day     Menthol-Methyl Salicylate (MUSCLE RUB) 10-15 % CREA Apply 1 application topically as needed for muscle pain.     No current facility-administered medications for this visit.    Allergies as of 12/14/2021 - Review Complete 12/14/2021  Allergen Reaction Noted   Pantoprazole sodium  07/18/2017   Latex Rash 02/16/2013   Sulfa antibiotics Swelling 02/11/2013    Family History  Problem Relation Age of Onset   Colon cancer Brother        61, deceased   Breast cancer Sister 71   Breast cancer Maternal Aunt 28   Heart attack Mother    Allergic rhinitis Mother    Allergic rhinitis Father     Social History   Socioeconomic History   Marital status: Divorced    Spouse name: Not on file   Number of children: Not on file   Years of education: Not on file   Highest  education level: Not on file  Occupational History   Not on file  Tobacco Use   Smoking status: Former    Packs/day: 0.25    Years: 3.00    Total pack years: 0.75    Types: Cigarettes   Smokeless tobacco: Never  Vaping Use   Vaping Use: Never used  Substance and Sexual Activity   Alcohol use: Yes    Comment: occ   Drug use: No   Sexual activity: Not Currently    Birth control/protection: Surgical  Other Topics Concern   Not on file  Social History Narrative   Not on file   Social Determinants of Radio broadcast assistant  Strain: Not on file  Food Insecurity: Not on file  Transportation Needs: Not on file  Physical Activity: Not on file  Stress: Not on file  Social Connections: Not on file    Review of Systems: Gen: Denies fever, chills, cold or flulike symptoms, presyncope, syncope. CV: Denies chest pain, palpitations. Resp: Denies dyspnea, cough.  GI: See HPI  Heme: See HPI  Physical Exam: BP 109/70 (BP Location: Right Arm, Patient Position: Sitting, Cuff Size: Normal)   Pulse (!) 104   Temp 98.1 F (36.7 C) (Temporal)   Ht '5\' 4"'$  (1.626 m)   Wt 203 lb 12.8 oz (92.4 kg)   BMI 34.98 kg/m  General:   Alert and oriented. No distress noted. Pleasant and cooperative.  Head:  Normocephalic and atraumatic. Eyes:  Conjuctiva clear without scleral icterus. Heart:  S1, S2 present without murmurs appreciated. Lungs:  Clear to auscultation bilaterally. No wheezes, rales, or rhonchi. No distress.  Abdomen:  +BS, soft, non-tender and non-distended. No rebound or guarding. No HSM or masses noted. Msk:  Symmetrical without gross deformities. Normal posture. Extremities:  Without edema. Neurologic:  Alert and  oriented x4 Psych:  Normal mood and affect.    Assessment:  76 year old female with history of anxiety, asthma, HTN, gout, GERD, constipation, adenomatous colon polyps with colonoscopy up to date in 2019, presenting today for follow-up with chief complaint of  worsening constipation, uncontrolled GERD, and dysphagia.  Constipation: Chronic.  Symptoms previously fairly well controlled on Amitiza 24 mcg twice daily, Benefiber twice daily, and Colace as needed.  Of the last 3 months, she is been requiring Colace more frequently.  No alarm symptoms.  Discussed changing Amitiza, but patient prefers to continue with this.  I recommended adding Colace daily.  GERD: Chronic.  Not adequately managed on Aciphex 20 mg twice daily which has been on for quite some time.  Having breakthrough symptoms every couple of days, symptoms worse at night.  Trying to avoid known dietary triggers and sleeps propped up.  We will try changing Aciphex to [place].  Dysphagia: New onset solid food dysphagia over the last 3 months.  This is in the setting of uncontrolled GERD.  May have esophagitis, esophageal web, ring, stricture.  Less likely malignancy.  We will plan for EGD with possible dilation for further evaluation and therapeutic intervention.   Plan:  Proceed with upper endoscopy with propofol by Dr. Gala Romney in near future. The risks, benefits, and alternatives have been discussed with the patient in detail. The patient states understanding and desires to proceed.  ASA 2 BMP  Stop Aciphex and start Dexilant 60 mg daily. Patient will let me know if she continues with frequent breakthrough symptoms, especially at night after a few weeks of taking Dexilant.  Can consider adding famotidine at bedtime at that point. Counseled on GERD diet/lifestyle.  Separate written instructions provided. Dysphagia precautions:  Eat slowly, take small bites, chew thoroughly, drink plenty of liquids throughout meals.  Avoid trough textures All meats should be chopped finely.  If something gets hung in your esophagus and will not come up or go down, proceed to the emergency room.   Continue Benefiber twice daily. Continue Amitiza 24 mcg twice daily with meals. Start Colace 100 mg daily.   May increase to 100 mg twice daily if needed. Patient let me know of any persistent constipation. Follow-up after EGD.   Aliene Altes, PA-C [facility] 12/14/2021

## 2021-12-13 NOTE — Progress Notes (Unsigned)
Referring Provider: Celene Squibb, MD Primary Care Physician:  Celene Squibb, MD Primary GI Physician: Dr. Gala Romney  No chief complaint on file.   HPI:   Kendra Orozco is a 76 y.o. female with history of constipation, hemorrhoids GERD, early satiety with normal gastric emptying study in 2021.  Had offered EGD in 2021 to further evaluate upper GI symptoms but patient declined.  We have not seen patient since January 2022 when she was seen for hemorrhoid banding and underwent banding of left lateral internal hemorrhoid.  Today:   Past Medical History:  Diagnosis Date   Angio-edema    Anxiety    Arthritis    Asthma    Cancer ([place])    skin and colon   GERD (gastroesophageal reflux disease)    Gout    Hypertension    Urticaria     Past Surgical History:  Procedure Laterality Date   ABDOMINAL HYSTERECTOMY     fibroid tumors   APPENDECTOMY     BACK SURGERY     BREAST CYST EXCISION Right    CATARACT EXTRACTION W/PHACO Right 11/20/2019   Procedure: CATARACT EXTRACTION PHACO AND INTRAOCULAR LENS PLACEMENT ([place]) RIGHT EYE;  Surgeon: Baruch Goldmann, MD;  Location: [facility];  Service: Ophthalmology;  Laterality: Right;  CDE: 17.30   CATARACT EXTRACTION W/PHACO Left 12/04/2019   Procedure: CATARACT EXTRACTION PHACO AND INTRAOCULAR LENS PLACEMENT LEFT EYE;  Surgeon: Baruch Goldmann, MD;  Location: [facility];  Service: Ophthalmology;  Laterality: Left;  CDE: 14.59   COLONOSCOPY  09/16/2007   MHD:[id number] canal hemorrhoids, otherwise normal rectum left-sided diverticula and colonic mucosa appeared normal.   COLONOSCOPY N/A 03/02/2013   Dr. Chauncy Lean adenoma. Colonic diverticulosis. Anal canal hemorrhoids-likely source of hematochezia. Surveillance 2019   COLONOSCOPY WITH PROPOFOL N/A 07/29/2017   Non-bleeding internal hemorrhoids, cecal AVMs.    ESOPHAGOGASTRODUODENOSCOPY  09/16/2007   [id number] undulating Z-line, tiny distal esophageal  erosions consistent with mild erosive reflux  esophagitis, patulous EG junction, small hiatal hernia, otherwise normal stomach, D1, and D2.   HEMORRHOID SURGERY     X 2     Current Outpatient Medications  Medication Sig Dispense Refill   allopurinol (ZYLOPRIM) 100 MG tablet Take 100 mg by mouth daily.     Budeson-Glycopyrrol-Formoterol (BREZTRI AEROSPHERE) 160-9-4.8 MCG/ACT AERO Inhale 1-2 puffs into the lungs 2 (two) times daily. (Patient not taking: Reported on 08/09/2021)     budesonide-formoterol (SYMBICORT) 160-4.5 MCG/ACT inhaler Inhale 2 puffs into the lungs 2 (two) times daily. 1 each 3   calcium carbonate (OS-CAL) 1250 MG chewable tablet Chew 1 tablet by mouth 2 (two) times a week.      diphenhydrAMINE (BENADRYL) 25 mg capsule Take 25 mg by mouth daily as needed for allergies.     docusate sodium (COLACE) 250 MG capsule Take 250 mg by mouth daily as needed for constipation.     FLUZONE HIGH-DOSE QUADRIVALENT 0.7 ML SUSY      ibuprofen (ADVIL,MOTRIN) 200 MG tablet Take 200-400 mg by mouth daily as needed.      ketoconazole (NIZORAL) 2 % cream Apply topically as needed.     levocetirizine (XYZAL) 5 MG tablet Take 5 mg by mouth at bedtime.     LORazepam (ATIVAN) 0.5 MG tablet Take 0.5 mg by mouth 2 (two) times daily as needed for anxiety.      lubiprostone (AMITIZA) 24 MCG capsule Take 24 mcg by mouth 2 (two) times daily with a meal.     Menthol-Methyl  Salicylate (MUSCLE RUB) 10-15 % CREA Apply 1 application topically as needed for muscle pain.     Multiple Vitamin (MULTIVITAMIN WITH MINERALS) TABS tablet Take 1 tablet by mouth daily.     nystatin (MYCOSTATIN) 100000 UNIT/ML suspension Take 5 mLs (500,000 Units total) by mouth 4 (four) times daily. 60 mL 0   olmesartan (BENICAR) 40 MG tablet Take 40 mg by mouth every morning.     ondansetron (ZOFRAN) 4 MG tablet Take 1 tablet (4 mg total) by mouth every 6 (six) hours as needed for nausea. 20 tablet 0   Polyvinyl Alcohol-Povidone (REFRESH OP) Apply to eye as needed.     potassium  chloride (KLOR-CON) 10 MEQ tablet Take 10 mEq by mouth daily.     predniSONE (DELTASONE) 5 MG tablet Take 7.5 mg by mouth daily.     RABEprazole (ACIPHEX) 20 MG tablet Take 1 tablet (20 mg total) by mouth 2 (two) times daily before a meal. 180 tablet 3   SYMBICORT 160-4.5 MCG/ACT inhaler Inhale 2 puffs into the lungs 2 (two) times daily. 10.2 g 11   torsemide (DEMADEX) 20 MG tablet Take 20 mg by mouth daily.     triamcinolone (KENALOG) 0.1 % SMARTSIG:Topical 1 to 2 Times Daily PRN     Wheat Dextrin (BENEFIBER) POWD Take 1 Package by mouth. 1-2 times per day     No current facility-administered medications for this visit.    Allergies as of 12/14/2021 - Review Complete 08/10/2021  Allergen Reaction Noted   Pantoprazole sodium  07/18/2017   Latex Rash 02/16/2013   Sulfa antibiotics Swelling 02/11/2013    Family History  Problem Relation Age of Onset   Colon cancer Brother        41, deceased   Breast cancer Sister 46   Breast cancer Maternal Aunt 28   Heart attack Mother    Allergic rhinitis Mother    Allergic rhinitis Father     Social History   Socioeconomic History   Marital status: Divorced    Spouse name: Not on file   Number of children: Not on file   Years of education: Not on file   Highest education level: Not on file  Occupational History   Not on file  Tobacco Use   Smoking status: Former    Packs/day: 0.25    Years: 3.00    Total pack years: 0.75    Types: Cigarettes   Smokeless tobacco: Never  Vaping Use   Vaping Use: Never used  Substance and Sexual Activity   Alcohol use: Yes    Comment: occ   Drug use: No   Sexual activity: Not Currently    Birth control/protection: Surgical  Other Topics Concern   Not on file  Social History Narrative   Not on file   Social Determinants of Health   Financial Resource Strain: Not on file  Food Insecurity: Not on file  Transportation Needs: Not on file  Physical Activity: Not on file  Stress: Not on file   Social Connections: Not on file    Review of Systems: Gen: Denies fever, chills, cold or flulike symptoms, presyncope, syncope. CV: Denies chest pain, palpitations. Resp: Denies dyspnea, cough.  GI: See HPI  Heme: See HPI  Physical Exam: There were no vitals taken for this visit. General:   Alert and oriented. No distress noted. Pleasant and cooperative.  Head:  Normocephalic and atraumatic. Eyes:  Conjuctiva clear without scleral icterus. Heart:  S1, S2 present without murmurs appreciated.  Lungs:  Clear to auscultation bilaterally. No wheezes, rales, or rhonchi. No distress.  Abdomen:  +BS, soft, non-tender and non-distended. No rebound or guarding. No HSM or masses noted. Msk:  Symmetrical without gross deformities. Normal posture. Extremities:  Without edema. Neurologic:  Alert and  oriented x4 Psych:  Normal mood and affect.    Assessment:     Plan:  ***   Aliene Altes, PA-C [facility] 12/14/2021

## 2021-12-14 ENCOUNTER — Encounter: Payer: Self-pay | Admitting: *Deleted

## 2021-12-14 ENCOUNTER — Encounter: Payer: Self-pay | Admitting: Gastroenterology

## 2021-12-14 ENCOUNTER — Ambulatory Visit (INDEPENDENT_AMBULATORY_CARE_PROVIDER_SITE_OTHER): Payer: PPO | Admitting: Gastroenterology

## 2021-12-14 VITALS — BP 109/70 | HR 104 | Temp 98.1°F | Ht 64.0 in | Wt 203.8 lb

## 2021-12-14 DIAGNOSIS — R1310 Dysphagia, unspecified: Secondary | ICD-10-CM | POA: Diagnosis not present

## 2021-12-14 DIAGNOSIS — K5900 Constipation, unspecified: Secondary | ICD-10-CM

## 2021-12-14 DIAGNOSIS — K219 Gastro-esophageal reflux disease without esophagitis: Secondary | ICD-10-CM

## 2021-12-14 MED ORDER — DEXLANSOPRAZOLE 60 MG PO CPDR: 60.0000 mg | DELAYED_RELEASE_CAPSULE | Freq: Every day | ORAL | 3 refills | Status: DC

## 2021-12-14 NOTE — Patient Instructions (Addendum)
We will arrange for you to have an upper endoscopy with possible stretching of your esophagus in the near future with Dr. Gala Romney.  For reflux: Stop rabeprazole (Aciphex) and start dexlansoprazole (Dexilant) 60 mg daily. After a few weeks, if you continue with frequent breakthrough symptoms at nighttime, please let me know and I can prescribe something in addition to Dexilant to take at bedtime.  Follow a GERD diet:  Avoid fried, fatty, greasy, spicy, citrus foods. Avoid caffeine and carbonated beverages. Avoid chocolate. Try eating 4-6 small meals a day rather than 3 large meals. Do not eat within 3 hours of laying down. Prop head of bed up on wood or bricks to create a 6 inch incline or sleep on a wedge.  Swallowing precautions:  Eat slowly, take small bites, chew thoroughly, drink plenty of liquids throughout meals.  Avoid trough textures All meats should be chopped finely.  If something gets hung in your esophagus and will not come up or go down, proceed to the emergency room.    For constipation: Continue Benefiber twice daily. Continue Amitiza 24 mcg twice daily with meals. Start Colace 100 mg daily.  You may increase to 100 mg twice daily if needed. Let me know if you have any persistent constipation.  We will follow-up with you in the office after your upper endoscopy.   It was nice to meet you today!  Aliene Altes, PA-C [facility]

## 2022-01-02 DIAGNOSIS — H903 Sensorineural hearing loss, bilateral: Secondary | ICD-10-CM | POA: Diagnosis not present

## 2022-01-02 DIAGNOSIS — J342 Deviated nasal septum: Secondary | ICD-10-CM | POA: Diagnosis not present

## 2022-01-02 DIAGNOSIS — H6123 Impacted cerumen, bilateral: Secondary | ICD-10-CM | POA: Diagnosis not present

## 2022-01-02 DIAGNOSIS — J310 Chronic rhinitis: Secondary | ICD-10-CM | POA: Diagnosis not present

## 2022-01-02 DIAGNOSIS — J343 Hypertrophy of nasal turbinates: Secondary | ICD-10-CM | POA: Diagnosis not present

## 2022-01-02 DIAGNOSIS — H9313 Tinnitus, bilateral: Secondary | ICD-10-CM | POA: Diagnosis not present

## 2022-01-08 ENCOUNTER — Encounter (HOSPITAL_COMMUNITY)
Admission: RE | Disposition: A | Discharge status: Home / Self Care | Payer: Self-pay | Source: Home / Self Care | Attending: Internal Medicine

## 2022-01-08 ENCOUNTER — Ambulatory Visit (HOSPITAL_COMMUNITY): Payer: PPO | Admitting: Certified Registered Nurse Anesthetist

## 2022-01-08 ENCOUNTER — Encounter (HOSPITAL_COMMUNITY): Payer: Self-pay | Admitting: Internal Medicine

## 2022-01-08 ENCOUNTER — Ambulatory Visit (HOSPITAL_BASED_OUTPATIENT_CLINIC_OR_DEPARTMENT_OTHER): Payer: PPO | Admitting: Certified Registered Nurse Anesthetist

## 2022-01-08 ENCOUNTER — Ambulatory Visit (HOSPITAL_COMMUNITY)
Admission: RE | Admit: 2022-01-08 | Discharge: 2022-01-08 | Disposition: A | Discharge status: Home / Self Care | Payer: PPO | Attending: Internal Medicine | Admitting: Internal Medicine

## 2022-01-08 ENCOUNTER — Other Ambulatory Visit: Payer: Self-pay

## 2022-01-08 DIAGNOSIS — R1310 Dysphagia, unspecified: Secondary | ICD-10-CM | POA: Diagnosis not present

## 2022-01-08 DIAGNOSIS — K449 Diaphragmatic hernia without obstruction or gangrene: Secondary | ICD-10-CM | POA: Diagnosis not present

## 2022-01-08 DIAGNOSIS — J45909 Unspecified asthma, uncomplicated: Secondary | ICD-10-CM | POA: Insufficient documentation

## 2022-01-08 DIAGNOSIS — K317 Polyp of stomach and duodenum: Secondary | ICD-10-CM | POA: Diagnosis not present

## 2022-01-08 DIAGNOSIS — Z87891 Personal history of nicotine dependence: Secondary | ICD-10-CM | POA: Diagnosis not present

## 2022-01-08 DIAGNOSIS — I10 Essential (primary) hypertension: Secondary | ICD-10-CM | POA: Diagnosis not present

## 2022-01-08 DIAGNOSIS — F419 Anxiety disorder, unspecified: Secondary | ICD-10-CM | POA: Insufficient documentation

## 2022-01-08 DIAGNOSIS — K219 Gastro-esophageal reflux disease without esophagitis: Secondary | ICD-10-CM | POA: Insufficient documentation

## 2022-01-08 DIAGNOSIS — K5909 Other constipation: Secondary | ICD-10-CM | POA: Diagnosis not present

## 2022-01-08 DIAGNOSIS — K2950 Unspecified chronic gastritis without bleeding: Secondary | ICD-10-CM | POA: Insufficient documentation

## 2022-01-08 HISTORY — PX: ESOPHAGOGASTRODUODENOSCOPY (EGD) WITH PROPOFOL: SHX5813

## 2022-01-08 HISTORY — PX: BIOPSY: SHX5522

## 2022-01-08 HISTORY — PX: MALONEY DILATION: SHX5535

## 2022-01-08 SURGERY — ESOPHAGOGASTRODUODENOSCOPY (EGD) WITH PROPOFOL
Anesthesia: General

## 2022-01-08 MED ORDER — LACTATED RINGERS IV SOLN: INTRAVENOUS | Status: DC

## 2022-01-08 MED ORDER — LACTATED RINGERS IV SOLN: INTRAVENOUS | Status: DC | PRN

## 2022-01-08 MED ORDER — PROPOFOL 500 MG/50ML IV EMUL
INTRAVENOUS | Status: DC | PRN
  Administered 2022-01-08 (×2): 100 mg via INTRAVENOUS

## 2022-01-08 MED ORDER — PROPOFOL 10 MG/ML IV BOLUS
INTRAVENOUS | Status: AC
  Filled 2022-01-08: qty 20

## 2022-01-08 NOTE — Interval H&P Note (Signed)
History and Physical Interval Note:  01/08/2022 2:32 PM  Kendra Orozco  has presented today for surgery, with the diagnosis of dysphagia,GERD.  The various methods of treatment have been discussed with the patient and family. After consideration of risks, benefits and other options for treatment, the patient has consented to  Procedure(s) with comments: ESOPHAGOGASTRODUODENOSCOPY (EGD) WITH PROPOFOL (N/A) - 2:30 pm MALONEY DILATION (N/A) as a surgical intervention.  The patient's history has been reviewed, patient examined, no change in status, stable for surgery.  I have reviewed the patient's chart and labs.  Questions were answered to the patient's satisfaction.     Kendra Orozco   patient doing better with reflux control taking Dexilant 60 mg daily.  Still with esophageal dysphagia.  I have offered the patient an EGD with esophageal dilation  as feasible/appropriate per plan.  The risks, benefits, limitations, alternatives and imponderables have been reviewed with the patient. Potential for esophageal dilation, biopsy, etc. have also been reviewed.  Questions have been answered. All parties agreeable.

## 2022-01-08 NOTE — Transfer of Care (Signed)
Immediate Anesthesia Transfer of Care Note  Patient: Kendra Orozco  Procedure(s) Performed: ESOPHAGOGASTRODUODENOSCOPY (EGD) WITH PROPOFOL [place]  Patient Location: Short Stay  Anesthesia Type:General  Level of Consciousness: awake, alert  and oriented  Airway & Oxygen Therapy: Patient Spontanous Breathing  Post-op Assessment: Report given to RN and Post -op Vital signs reviewed and stable  Post vital signs: Reviewed and stable  Last Vitals:  Vitals Value Taken Time  BP 107/64   Temp 36   Pulse 74   Resp 16   SpO2 96     Last Pain:  Vitals:   01/08/22 1238  TempSrc: Oral  PainSc: 0-No pain      Patients Stated Pain Goal: 4 (16/10/96 0454)  Complications: No notable events documented.

## 2022-01-08 NOTE — Discharge Instructions (Addendum)
EGD Discharge instructions Please read the instructions outlined below and refer to this sheet in the next few weeks. These discharge instructions provide you with general information on caring for yourself after you leave the hospital. Your doctor may also give you specific instructions. While your treatment has been planned according to the most current medical practices available, unavoidable complications occasionally occur. If you have any problems or questions after discharge, please call your doctor. ACTIVITY You may resume your regular activity but move at a slower pace for the next 24 hours.  Take frequent rest periods for the next 24 hours.  Walking will help expel (get rid of) the air and reduce the bloated feeling in your abdomen.  No driving for 24 hours (because of the anesthesia (medicine) used during the test).  You may shower.  Do not sign any important legal documents or operate any machinery for 24 hours (because of the anesthesia used during the test).  NUTRITION Drink plenty of fluids.  You may resume your normal diet.  Begin with a light meal and progress to your normal diet.  Avoid alcoholic beverages for 24 hours or as instructed by your caregiver.  MEDICATIONS You may resume your normal medications unless your caregiver tells you otherwise.  WHAT YOU CAN EXPECT TODAY You may experience abdominal discomfort such as a feeling of fullness or "gas" pains.  FOLLOW-UP Your doctor will discuss the results of your test with you.  SEEK IMMEDIATE MEDICAL ATTENTION IF ANY OF THE FOLLOWING OCCUR: Excessive nausea (feeling sick to your stomach) and/or vomiting.  Severe abdominal pain and distention (swelling).  Trouble swallowing.  Temperature over 101 F (37.8 C).  Rectal bleeding or vomiting of blood.       Your esophagus was stretched today.  You had a stomach polyp which I removed.    Continue taking Dexilant 60 mg daily 30 minutes before breakfast   office visit  with Aliene Altes in 6 weeks     ***LEFT MESSAGE ON OFFICE VOICEMAIL TO SCHEDULE FOLLOW UP APPOINTMENT, PREFER AFTERNOON   at patient request, I called Dyke Brackett at [phone] rolled to voicemail.  Left a message.

## 2022-01-08 NOTE — H&P (Signed)
See interval H&P 9/25.

## 2022-01-08 NOTE — Op Note (Signed)
[facility] [place] Patient Name: Kendra Orozco Procedure Date: 01/08/2022 2:22 PM MRN: [id number] Date of Birth: [birth year] Attending MD: Norvel Richards , MD CSN: [id number] Age: 76 Admit Type: Outpatient Procedure:                Upper GI endoscopy Indications:              Dysphagia Providers:                Norvel Richards, MD, Janeece Riggers, RN, Ladoris Gene Technician, Technician, Aram Candela Referring MD:              Medicines:                Propofol per Anesthesia Complications:            No immediate complications. Estimated Blood Loss:     Estimated blood loss was minimal. Procedure:                Pre-Anesthesia Assessment:                           - Prior to the procedure, a History and Physical                            was performed, and patient medications and                            allergies were reviewed. The patient's tolerance of                            previous anesthesia was also reviewed. The risks                            and benefits of the procedure and the sedation                            options and risks were discussed with the patient.                            All questions were answered, and informed consent                            was obtained. Prior Anticoagulants: The patient has                            taken no previous anticoagulant or antiplatelet                            agents. ASA Grade Assessment: III - A patient with                            severe systemic disease. After reviewing the risks  and benefits, the patient was deemed in                            satisfactory condition to undergo the procedure.                           After obtaining informed consent, the endoscope was                            passed under direct vision. Throughout the                            procedure, the patient's blood pressure, pulse, and                             oxygen saturations were monitored continuously. The                            GIF-H190 ([id number]) scope was introduced through the                            mouth, and advanced to the second part of duodenum.                            The upper GI endoscopy was accomplished without                            difficulty. The patient tolerated the procedure                            well. Scope In: 2:41:41 PM Scope Out: 2:48:40 PM Total Procedure Duration: 0 hours 6 minutes 59 seconds  Findings:      The examined esophagus was normal.      A small hiatal hernia was present. multiple 3 to 7 mm hyperplastic       appearing polyps scattered throughout the gastric mucosa. No ulcer or       infiltrating process seen. Pylorus patent.      The duodenal bulb and second portion of the duodenum were normal. The       scope was withdrawn. Dilation was performed with a Maloney dilator with       mild resistance at 19 Fr. The scope was withdrawn. Dilation was       performed with a Maloney dilator with mild resistance at 56 Fr. The       dilation site was examined following endoscope reinsertion and showed no       change. Estimated blood loss: none. Finally, one of the gastric polyps       was removed with cold biopsy forceps. Gastric mucosal biopsies of the       antrum and Impression:               - Normal esophagus. Dilated.                           - Small hiatal hernia. Gastric polyps. Status post  biopsy. Gastric mucosa biopsy                           - Normal duodenal bulb and second portion of the                            duodenum.                           - patient states Dexilant has helped with her                            reflux better than Aciphex. We will continue                            Dexilant 60 mg daily. Follow-up on pathology.                            Office visit in 6 weeks. Moderate Sedation:      Moderate (conscious) sedation was  personally administered by an       anesthesia professional. The following parameters were monitored: oxygen       saturation, heart rate, blood pressure, and response to care. Recommendation:           - Patient has a contact number available for                            emergencies. The signs and symptoms of potential                            delayed complications were discussed with the                            patient. Return to normal activities tomorrow.                            Written discharge instructions were provided to the                            patient.                           - Advance diet as tolerated.                           - Continue present medications.                           - Return to my office in 6 weeks. Procedure Code(s):        --- Professional ---                           [id number], Esophagogastroduodenoscopy, flexible,                            transoral; diagnostic, including collection of  specimen(s) by brushing or washing, when performed                            (separate procedure)                           43450, Dilation of esophagus, by unguided sound or                            bougie, single or multiple passes Diagnosis Code(s):        --- Professional ---                           K44.9, Diaphragmatic hernia without obstruction or                            gangrene                           R13.10, Dysphagia, unspecified CPT copyright 2019 American Medical Association. All rights reserved. The codes documented in this report are preliminary and upon coder review may  be revised to meet current compliance requirements. Cristopher Estimable. , MD Norvel Richards, MD 01/08/2022 2:58:15 PM This report has been signed electronically. Number of Addenda: 0

## 2022-01-08 NOTE — Anesthesia Preprocedure Evaluation (Signed)
Anesthesia Evaluation  Patient identified by MRN, date of birth, ID band Patient awake    Reviewed: Allergy & Precautions, H&P , NPO status , Patient's Chart, lab work & pertinent test results, reviewed documented beta blocker date and time   Airway Mallampati: II  TM Distance: >3 FB Neck ROM: full    Dental no notable dental hx.    Pulmonary asthma , former smoker,    Pulmonary exam normal breath sounds clear to auscultation       Cardiovascular Exercise Tolerance: Good hypertension, negative cardio ROS   Rhythm:regular Rate:Normal     Neuro/Psych PSYCHIATRIC DISORDERS Anxiety negative neurological ROS     GI/Hepatic Neg liver ROS, GERD  Medicated,  Endo/Other  negative endocrine ROS  Renal/GU negative Renal ROS  negative genitourinary   Musculoskeletal   Abdominal   Peds  Hematology negative hematology ROS (+)   Anesthesia Other Findings   Reproductive/Obstetrics negative OB ROS                             Anesthesia Physical Anesthesia Plan  ASA: 2  Anesthesia Plan: General   Post-op Pain Management:    Induction:   PONV Risk Score and Plan: Propofol infusion  Airway Management Planned:   Additional Equipment:   Intra-op Plan:   Post-operative Plan:   Informed Consent: I have reviewed the patients History and Physical, chart, labs and discussed the procedure including the risks, benefits and alternatives for the proposed anesthesia with the patient or authorized representative who has indicated his/her understanding and acceptance.     Dental Advisory Given  Plan Discussed with: CRNA  Anesthesia Plan Comments:         Anesthesia Quick Evaluation

## 2022-01-10 NOTE — Anesthesia Postprocedure Evaluation (Signed)
Anesthesia Post Note  Patient: Kendra Orozco  Procedure(s) Performed: ESOPHAGOGASTRODUODENOSCOPY (EGD) WITH PROPOFOL [place]  Patient location during evaluation: Phase II Anesthesia Type: General Level of consciousness: awake Pain management: pain level controlled Vital Signs Assessment: post-procedure vital signs reviewed and stable Respiratory status: spontaneous breathing and respiratory function stable Cardiovascular status: blood pressure returned to baseline and stable Postop Assessment: no headache and no apparent nausea or vomiting Anesthetic complications: no Comments: Late entry   No notable events documented.   Last Vitals:  Vitals:   01/08/22 1238 01/08/22 1452  BP: 114/68 (!) 102/49  Pulse: 100 81  Resp: (!) 21 19  Temp: 36.8 C 36.5 C  SpO2: 97% 98%    Last Pain:  Vitals:   01/08/22 1452  TempSrc: Oral  PainSc: 0-No pain                 Louann Sjogren

## 2022-01-11 LAB — SURGICAL PATHOLOGY

## 2022-01-13 DIAGNOSIS — E782 Mixed hyperlipidemia: Secondary | ICD-10-CM | POA: Diagnosis not present

## 2022-01-13 DIAGNOSIS — I10 Essential (primary) hypertension: Secondary | ICD-10-CM | POA: Diagnosis not present

## 2022-01-13 DIAGNOSIS — K219 Gastro-esophageal reflux disease without esophagitis: Secondary | ICD-10-CM | POA: Diagnosis not present

## 2022-01-14 ENCOUNTER — Encounter: Payer: Self-pay | Admitting: Internal Medicine

## 2022-01-16 ENCOUNTER — Encounter (HOSPITAL_COMMUNITY): Payer: Self-pay | Admitting: Internal Medicine

## 2022-02-09 ENCOUNTER — Telehealth: Payer: Self-pay | Admitting: *Deleted

## 2022-02-09 ENCOUNTER — Other Ambulatory Visit: Payer: Self-pay | Admitting: Gastroenterology

## 2022-02-09 DIAGNOSIS — K219 Gastro-esophageal reflux disease without esophagitis: Secondary | ICD-10-CM

## 2022-02-09 MED ORDER — DEXLANSOPRAZOLE 60 MG PO CPDR: 60.0000 mg | DELAYED_RELEASE_CAPSULE | Freq: Every day | ORAL | 5 refills | Status: DC

## 2022-02-09 NOTE — Telephone Encounter (Signed)
Noted  

## 2022-02-09 NOTE — Telephone Encounter (Signed)
[facility] called and states pt needs a new prescription for Dexlansoprazole '60mg'$  sent in to pharmacy

## 2022-02-09 NOTE — Telephone Encounter (Signed)
Rx sent 

## 2022-02-16 DIAGNOSIS — E790 Hyperuricemia without signs of inflammatory arthritis and tophaceous disease: Secondary | ICD-10-CM | POA: Diagnosis not present

## 2022-02-16 DIAGNOSIS — E669 Obesity, unspecified: Secondary | ICD-10-CM | POA: Diagnosis not present

## 2022-02-16 DIAGNOSIS — D509 Iron deficiency anemia, unspecified: Secondary | ICD-10-CM | POA: Diagnosis not present

## 2022-02-19 NOTE — Progress Notes (Unsigned)
GI Office Note    Referring Provider: Celene Squibb, MD Primary Care Physician:  Celene Squibb, MD  Primary Gastroenterologist: Garfield Cornea, MD   Chief Complaint   No chief complaint on file.   History of Present Illness   Kendra Orozco is a 76 y.o. female presenting today for follow up. Seen in 11/2021 for constipation, GERD, hemorrhoids.     Amitiza 67mg BID. Aciphex BID.  EGD 12/2021: -normal esophagus -Small hiatal hernia. Gastric polyps. Status post biopsy. Gastric mucosa biopsy -Normal duodenal bulb and second portion of the duodenum. -fundic gland polyp -mild chronic gastritis. No H.pylori   patient states Dexilant has helped with her reflux better than Aciphex. -We will continueDexilant 60 mg daily. Follow-up on pathology. Office visit in 6 weeks.  Medications   Current Outpatient Medications  Medication Sig Dispense Refill   albuterol (VENTOLIN HFA) 108 (90 Base) MCG/ACT inhaler Inhale 2 puffs into the lungs every 4 (four) hours as needed for wheezing or shortness of breath.     allopurinol (ZYLOPRIM) 100 MG tablet Take 100 mg by mouth 3 (three) times daily.     Budeson-Glycopyrrol-Formoterol (BREZTRI AEROSPHERE) 160-9-4.8 MCG/ACT AERO Inhale 2 puffs into the lungs 2 (two) times daily.     budesonide-formoterol (SYMBICORT) 160-4.5 MCG/ACT inhaler Inhale 2 puffs into the lungs 2 (two) times daily. 1 each 3   calcium carbonate (OS-CAL - DOSED IN MG OF ELEMENTAL CALCIUM) 1250 (500 Ca) MG tablet Chew 1 tablet by mouth 2 (two) times a week.      dexlansoprazole (DEXILANT) 60 MG capsule Take 1 capsule (60 mg total) by mouth daily. 30 capsule 5   docusate sodium (COLACE) 250 MG capsule Take 250 mg by mouth 2 (two) times a week.     FLUZONE HIGH-DOSE QUADRIVALENT 0.7 ML SUSY      hydrocortisone 2.5 % cream Apply 2.5 Applications topically daily as needed (hemorrhoids).     ibuprofen (ADVIL,MOTRIN) 200 MG tablet Take 400 mg by mouth daily as needed for mild pain or  moderate pain (Back pain).     ipratropium (ATROVENT) 0.06 % nasal spray Place 2 sprays into both nostrils 2 (two) times daily.     ketoconazole (NIZORAL) 2 % cream Apply 1 Application topically daily as needed for irritation.     levocetirizine (XYZAL) 5 MG tablet Take 5 mg by mouth at bedtime.     LORazepam (ATIVAN) 1 MG tablet Take 0.5 mg by mouth 2 (two) times daily.     lubiprostone (AMITIZA) 24 MCG capsule Take 24 mcg by mouth 2 (two) times daily with a meal.     Menthol-Methyl Salicylate (MUSCLE RUB) 10-15 % CREA Apply 1 application  topically daily as needed for muscle pain.     methocarbamol (ROBAXIN) 500 MG tablet Take 500 mg by mouth daily as needed for muscle spasms.     Multiple Vitamin (MULTIVITAMIN WITH MINERALS) TABS tablet Take 1 tablet by mouth daily.     olmesartan (BENICAR) 40 MG tablet Take 40 mg by mouth daily.     ondansetron (ZOFRAN) 4 MG tablet Take 1 tablet (4 mg total) by mouth every 6 (six) hours as needed for nausea. 20 tablet 0   Polyvinyl Alcohol-Povidone (REFRESH OP) Place 1 drop into both eyes daily as needed (dry eyes).     potassium chloride SA (KLOR-CON M) 20 MEQ tablet Take 20 mEq by mouth daily.     torsemide (DEMADEX) 20 MG tablet Take 20 mg by  mouth daily.     triamcinolone (KENALOG) 0.1 % Apply 1 Application topically daily at 4 PM.     Wheat Dextrin (BENEFIBER) POWD Take 1 Package by mouth 2 (two) times daily.     No current facility-administered medications for this visit.    Allergies   Allergies as of 02/20/2022 - Review Complete 01/08/2022  Allergen Reaction Noted   Pantoprazole sodium  07/18/2017   Plastibase  01/04/2022   Latex Rash 02/16/2013   Sulfa antibiotics Swelling 02/11/2013     Past Medical History   Past Medical History:  Diagnosis Date   Angio-edema    Anxiety    Arthritis    Asthma    Cancer ([place])    skin and colon   GERD (gastroesophageal reflux disease)    Gout    Hypertension    Urticaria     Past Surgical  History   Past Surgical History:  Procedure Laterality Date   ABDOMINAL HYSTERECTOMY     fibroid tumors   APPENDECTOMY     BACK SURGERY     BIOPSY  01/08/2022   Procedure: BIOPSY;  Surgeon: Daneil Dolin, MD;  Location: [facility];  Service: Endoscopy;;   BREAST CYST EXCISION Right    CATARACT EXTRACTION W/PHACO Right 11/20/2019   Procedure: CATARACT EXTRACTION PHACO AND INTRAOCULAR LENS PLACEMENT ([place]) RIGHT EYE;  Surgeon: Baruch Goldmann, MD;  Location: [facility];  Service: Ophthalmology;  Laterality: Right;  CDE: 17.30   CATARACT EXTRACTION W/PHACO Left 12/04/2019   Procedure: CATARACT EXTRACTION PHACO AND INTRAOCULAR LENS PLACEMENT LEFT EYE;  Surgeon: Baruch Goldmann, MD;  Location: [facility];  Service: Ophthalmology;  Laterality: Left;  CDE: 14.59   COLONOSCOPY  09/16/2007   ZDG:UYQI canal hemorrhoids, otherwise normal rectum left-sided diverticula and colonic mucosa appeared normal.   COLONOSCOPY N/A 03/02/2013   Dr. Chauncy Lean adenoma. Colonic diverticulosis. Anal canal hemorrhoids-likely source of hematochezia. Surveillance 2019   COLONOSCOPY WITH PROPOFOL N/A 07/29/2017   Non-bleeding internal hemorrhoids, cecal AVMs.    ESOPHAGOGASTRODUODENOSCOPY  09/16/2007   [id number] undulating Z-line, tiny distal esophageal  erosions consistent with mild erosive reflux esophagitis, patulous EG junction, small hiatal hernia, otherwise normal stomach, D1, and D2.   ESOPHAGOGASTRODUODENOSCOPY (EGD) WITH PROPOFOL N/A 01/08/2022   Procedure: ESOPHAGOGASTRODUODENOSCOPY (EGD) WITH PROPOFOL;  Surgeon: Daneil Dolin, MD;  Location: [facility];  Service: Endoscopy;  Laterality: N/A;  2:30 pm   HEMORRHOID SURGERY     X 2    MALONEY DILATION N/A 01/08/2022   Procedure: MALONEY DILATION;  Surgeon: Daneil Dolin, MD;  Location: [facility];  Service: Endoscopy;  Laterality: N/A;    Past Family History   Family History  Problem Relation Age of Onset   Colon cancer Brother        29,  deceased   Breast cancer Sister 55   Breast cancer Maternal Aunt 28   Heart attack Mother    Allergic rhinitis Mother    Allergic rhinitis Father     Past Social History   Social History   Socioeconomic History   Marital status: Divorced    Spouse name: Not on file   Number of children: Not on file   Years of education: Not on file   Highest education level: Not on file  Occupational History   Not on file  Tobacco Use   Smoking status: Former    Packs/day: 0.25    Years: 3.00    Total pack years: 0.75    Types:  Cigarettes   Smokeless tobacco: Never  Vaping Use   Vaping Use: Never used  Substance and Sexual Activity   Alcohol use: Yes    Comment: occ   Drug use: No   Sexual activity: Not Currently    Birth control/protection: Surgical  Other Topics Concern   Not on file  Social History Narrative   Not on file   Social Determinants of Health   Financial Resource Strain: Not on file  Food Insecurity: Not on file  Transportation Needs: Not on file  Physical Activity: Not on file  Stress: Not on file  Social Connections: Not on file  Intimate Partner Violence: Not on file    Review of Systems   General: Negative for anorexia, weight loss, fever, chills, fatigue, weakness. ENT: Negative for hoarseness, difficulty swallowing , nasal congestion. CV: Negative for chest pain, angina, palpitations, dyspnea on exertion, peripheral edema.  Respiratory: Negative for dyspnea at rest, dyspnea on exertion, cough, sputum, wheezing.  GI: See history of present illness. GU:  Negative for dysuria, hematuria, urinary incontinence, urinary frequency, nocturnal urination.  Endo: Negative for unusual weight change.     Physical Exam   There were no vitals taken for this visit.   General: Well-nourished, well-developed in no acute distress.  Eyes: No icterus. Mouth: Oropharyngeal mucosa moist and pink , no lesions erythema or exudate. Lungs: Clear to auscultation  bilaterally.  Heart: Regular rate and rhythm, no murmurs rubs or gallops.  Abdomen: Bowel sounds are normal, nontender, nondistended, no hepatosplenomegaly or masses,  no abdominal bruits or hernia , no rebound or guarding.  Rectal: ***  Extremities: No lower extremity edema. No clubbing or deformities. Neuro: Alert and oriented x 4   Skin: Warm and dry, no jaundice.   Psych: Alert and cooperative, normal mood and affect.  Labs   *** Imaging Studies   No results found.  Assessment       PLAN   ***   Laureen Ochs. Bobby Rumpf, [place], [facility]

## 2022-02-20 ENCOUNTER — Encounter: Payer: Self-pay | Admitting: Gastroenterology

## 2022-02-20 ENCOUNTER — Ambulatory Visit (INDEPENDENT_AMBULATORY_CARE_PROVIDER_SITE_OTHER): Payer: PPO | Admitting: Gastroenterology

## 2022-02-20 VITALS — BP 104/69 | HR 96 | Temp 97.6°F | Ht 64.0 in | Wt 204.6 lb

## 2022-02-20 DIAGNOSIS — K5900 Constipation, unspecified: Secondary | ICD-10-CM | POA: Diagnosis not present

## 2022-02-20 DIAGNOSIS — K219 Gastro-esophageal reflux disease without esophagitis: Secondary | ICD-10-CM

## 2022-02-20 DIAGNOSIS — R748 Abnormal levels of other serum enzymes: Secondary | ICD-10-CM | POA: Diagnosis not present

## 2022-02-20 DIAGNOSIS — R7301 Impaired fasting glucose: Secondary | ICD-10-CM | POA: Insufficient documentation

## 2022-02-20 DIAGNOSIS — D509 Iron deficiency anemia, unspecified: Secondary | ICD-10-CM | POA: Diagnosis not present

## 2022-02-20 NOTE — Patient Instructions (Signed)
Continue Dexilant 60 mg daily before breakfast. Continue Amitiza 24 mcg twice daily for constipation. Continue Benefiber twice daily. Continue stool softener as needed. Continue bisacodyl 15 mg 1 or 2 times weekly as needed. Plan for colonoscopy in December or January.  Will call to schedule. Update labs in 1 month to further evaluate elevated alkaline phosphatase which could be due to liver source.

## 2022-02-21 ENCOUNTER — Encounter: Payer: Self-pay | Admitting: *Deleted

## 2022-02-21 DIAGNOSIS — K581 Irritable bowel syndrome with constipation: Secondary | ICD-10-CM | POA: Diagnosis not present

## 2022-02-21 DIAGNOSIS — K219 Gastro-esophageal reflux disease without esophagitis: Secondary | ICD-10-CM | POA: Diagnosis not present

## 2022-02-21 DIAGNOSIS — K644 Residual hemorrhoidal skin tags: Secondary | ICD-10-CM | POA: Diagnosis not present

## 2022-02-21 DIAGNOSIS — J4530 Mild persistent asthma, uncomplicated: Secondary | ICD-10-CM | POA: Diagnosis not present

## 2022-02-21 DIAGNOSIS — J310 Chronic rhinitis: Secondary | ICD-10-CM | POA: Diagnosis not present

## 2022-02-21 DIAGNOSIS — E669 Obesity, unspecified: Secondary | ICD-10-CM | POA: Diagnosis not present

## 2022-02-21 DIAGNOSIS — I10 Essential (primary) hypertension: Secondary | ICD-10-CM | POA: Diagnosis not present

## 2022-02-21 DIAGNOSIS — E782 Mixed hyperlipidemia: Secondary | ICD-10-CM | POA: Diagnosis not present

## 2022-02-21 DIAGNOSIS — F411 Generalized anxiety disorder: Secondary | ICD-10-CM | POA: Diagnosis not present

## 2022-02-21 DIAGNOSIS — M545 Low back pain, unspecified: Secondary | ICD-10-CM | POA: Diagnosis not present

## 2022-02-21 DIAGNOSIS — Z23 Encounter for immunization: Secondary | ICD-10-CM | POA: Diagnosis not present

## 2022-02-21 DIAGNOSIS — R609 Edema, unspecified: Secondary | ICD-10-CM | POA: Insufficient documentation

## 2022-02-21 DIAGNOSIS — R7989 Other specified abnormal findings of blood chemistry: Secondary | ICD-10-CM | POA: Diagnosis not present

## 2022-02-21 DIAGNOSIS — E876 Hypokalemia: Secondary | ICD-10-CM | POA: Insufficient documentation

## 2022-02-22 ENCOUNTER — Encounter: Payer: Self-pay | Admitting: *Deleted

## 2022-03-19 DIAGNOSIS — R5382 Chronic fatigue, unspecified: Secondary | ICD-10-CM | POA: Diagnosis not present

## 2022-03-19 DIAGNOSIS — M797 Fibromyalgia: Secondary | ICD-10-CM | POA: Diagnosis not present

## 2022-03-19 DIAGNOSIS — E669 Obesity, unspecified: Secondary | ICD-10-CM | POA: Diagnosis not present

## 2022-03-19 DIAGNOSIS — Z6835 Body mass index (BMI) 35.0-35.9, adult: Secondary | ICD-10-CM | POA: Diagnosis not present

## 2022-03-19 DIAGNOSIS — M1A09X0 Idiopathic chronic gout, multiple sites, without tophus (tophi): Secondary | ICD-10-CM | POA: Diagnosis not present

## 2022-03-19 NOTE — Patient Instructions (Signed)
Kendra Orozco  03/19/2022     '@PREFPERIOPPHARMACY'$ @   Your procedure is scheduled on 03/23/2022.   Report to [facility] at  [phone]  A.M.   Call this number if you have problems the morning of surgery:  [phone]  If you experience any cold or flu symptoms such as cough, fever, chills, shortness of breath, etc. between now and your scheduled surgery, please notify us at the above number.   Remember:  Follow the diet and prep instructions given to you by the office.     Take these medicines the morning of surgery with A SIP OF WATER         allopurinol, dexlansoprazole, lorazepam, robaxin (if needed), zofran (if needed).     Do not wear jewelry, make-up or nail polish.  Do not wear lotions, powders, or perfumes, or deodorant.  Do not shave 48 hours prior to surgery.  Men may shave face and neck.  Do not bring valuables to the hospital.  [facility] is not responsible for any belongings or valuables.  Contacts, dentures or bridgework may not be worn into surgery.  Leave your suitcase in the car.  After surgery it may be brought to your room.  For patients admitted to the hospital, discharge time will be determined by your treatment team.  Patients discharged the day of surgery will not be allowed to drive home and must have someone with them for 24 hours.    Special instructions:   DO NOT smoke tobacco or vape for 24 hours before your procedure.  Please read over the following fact sheets that you were given. Anesthesia Post-op Instructions and Care and Recovery After Surgery      Colonoscopy, Adult, Care After The following information offers guidance on how to care for yourself after your procedure. Your health care provider may also give you more specific instructions. If you have problems or questions, contact your health care provider. What can I expect after the procedure? After the procedure, it is common to have: A small amount of blood in your stool  for 24 hours after the procedure. Some gas. Mild cramping or bloating of your abdomen. Follow these instructions at home: Eating and drinking  Drink enough fluid to keep your urine pale yellow. Follow instructions from your health care provider about eating or drinking restrictions. Resume your normal diet as told by your health care provider. Avoid heavy or fried foods that are hard to digest. Activity Rest as told by your health care provider. Avoid sitting for a long time without moving. Get up to take short walks every 1-2 hours. This is important to improve blood flow and breathing. Ask for help if you feel weak or unsteady. Return to your normal activities as told by your health care provider. Ask your health care provider what activities are safe for you. Managing cramping and bloating  Try walking around when you have cramps or feel bloated. If directed, apply heat to your abdomen as told by your health care provider. Use the heat source that your health care provider recommends, such as a moist heat pack or a heating pad. Place a towel between your skin and the heat source. Leave the heat on for 20-30 minutes. Remove the heat if your skin turns bright red. This is especially important if you are unable to feel pain, heat, or cold. You have a greater risk of getting burned. General instructions If you were given a  sedative during the procedure, it can affect you for several hours. Do not drive or operate machinery until your health care provider says that it is safe. For the first 24 hours after the procedure: Do not sign important documents. Do not drink alcohol. Do your regular daily activities at a slower pace than normal. Eat soft foods that are easy to digest. Take over-the-counter and prescription medicines only as told by your health care provider. Keep all follow-up visits. This is important. Contact a health care provider if: You have blood in your stool 2-3 days after  the procedure. Get help right away if: You have more than a small spotting of blood in your stool. You have large blood clots in your stool. You have swelling of your abdomen. You have nausea or vomiting. You have a fever. You have increasing pain in your abdomen that is not relieved with medicine. These symptoms may be an emergency. Get help right away. Call 911. Do not wait to see if the symptoms will go away. Do not drive yourself to the hospital. Summary After the procedure, it is common to have a small amount of blood in your stool. You may also have mild cramping and bloating of your abdomen. If you were given a sedative during the procedure, it can affect you for several hours. Do not drive or operate machinery until your health care provider says that it is safe. Get help right away if you have a lot of blood in your stool, nausea or vomiting, a fever, or increased pain in your abdomen. This information is not intended to replace advice given to you by your health care provider. Make sure you discuss any questions you have with your health care provider. Document Revised: 11/23/2020 Document Reviewed: 11/23/2020 Elsevier Patient Education  [place] After The following information offers guidance on how to care for yourself after your procedure. Your health care provider may also give you more specific instructions. If you have problems or questions, contact your health care provider. What can I expect after the procedure? After the procedure, it is common to have: Tiredness. Little or no memory about what happened during or after the procedure. Impaired judgment when it comes to making decisions. Nausea or vomiting. Some trouble with balance. Follow these instructions at home: For the time period you were told by your health care provider:  Rest. Do not participate in activities where you could fall or become injured. Do not drive or  use machinery. Do not drink alcohol. Do not take sleeping pills or medicines that cause drowsiness. Do not make important decisions or sign legal documents. Do not take care of children on your own. Medicines Take over-the-counter and prescription medicines only as told by your health care provider. If you were prescribed antibiotics, take them as told by your health care provider. Do not stop using the antibiotic even if you start to feel better. Eating and drinking Follow instructions from your health care provider about what you may eat and drink. Drink enough fluid to keep your urine pale yellow. If you vomit: Drink clear fluids slowly and in small amounts as you are able. Clear fluids include water, ice chips, low-calorie sports drinks, and fruit juice that has water added to it (diluted fruit juice). Eat light and bland foods in small amounts as you are able. These foods include bananas, applesauce, rice, lean meats, toast, and crackers. General instructions  Have a responsible adult stay with  you for the time you are told. It is important to have someone help care for you until you are awake and alert. If you have sleep apnea, surgery and some medicines can increase your risk for breathing problems. Follow instructions from your health care provider about wearing your sleep device: When you are sleeping. This includes during daytime naps. While taking prescription pain medicines, sleeping medicines, or medicines that make you drowsy. Do not use any products that contain nicotine or tobacco. These products include cigarettes, chewing tobacco, and vaping devices, such as e-cigarettes. If you need help quitting, ask your health care provider. Contact a health care provider if: You feel nauseous or vomit every time you eat or drink. You feel light-headed. You are still sleepy or having trouble with balance after 24 hours. You get a rash. You have a fever. You have redness or swelling  around the IV site. Get help right away if: You have trouble breathing. You have new confusion after you get home. These symptoms may be an emergency. Get help right away. Call 911. Do not wait to see if the symptoms will go away. Do not drive yourself to the hospital. This information is not intended to replace advice given to you by your health care provider. Make sure you discuss any questions you have with your health care provider. Document Revised: 08/28/2021 Document Reviewed: 08/28/2021 Elsevier Patient Education  [place].

## 2022-03-20 ENCOUNTER — Encounter (HOSPITAL_COMMUNITY): Payer: Self-pay

## 2022-03-20 ENCOUNTER — Encounter (HOSPITAL_COMMUNITY)
Admission: RE | Admit: 2022-03-20 | Discharge: 2022-03-20 | Disposition: A | Discharge status: Home / Self Care | Payer: PPO | Source: Ambulatory Visit | Attending: Internal Medicine | Admitting: Internal Medicine

## 2022-03-20 VITALS — BP 131/74 | HR 84 | Temp 97.8°F | Resp 18 | Ht 64.0 in | Wt 204.6 lb

## 2022-03-20 DIAGNOSIS — Z79899 Other long term (current) drug therapy: Secondary | ICD-10-CM | POA: Insufficient documentation

## 2022-03-20 DIAGNOSIS — Z01818 Encounter for other preprocedural examination: Secondary | ICD-10-CM | POA: Insufficient documentation

## 2022-03-20 DIAGNOSIS — I10 Essential (primary) hypertension: Secondary | ICD-10-CM | POA: Diagnosis not present

## 2022-03-20 LAB — BASIC METABOLIC PANEL
Anion gap: 11 (ref 5–15)
BUN: 24 mg/dL — ABNORMAL HIGH (ref 8–23)
CO2: 23 mmol/L (ref 22–32)
Calcium: 8.8 mg/dL — ABNORMAL LOW (ref 8.9–10.3)
Chloride: 104 mmol/L (ref 98–111)
Creatinine, Ser: 0.79 mg/dL (ref 0.44–1.00)
GFR, Estimated: >60 mL/min (ref >60)
Glucose, Bld: 107 mg/dL — ABNORMAL HIGH (ref 70–99)
Potassium: 3.5 mmol/L (ref 3.5–5.1)
Sodium: 138 mmol/L (ref 135–145)

## 2022-03-21 ENCOUNTER — Other Ambulatory Visit: Payer: Self-pay | Admitting: *Deleted

## 2022-03-21 MED ORDER — PEG 3350-KCL-NA BICARB-NACL 420 G PO SOLR: 4000.0000 mL | Freq: Once | ORAL | 0 refills | Status: AC

## 2022-03-23 ENCOUNTER — Encounter (HOSPITAL_COMMUNITY)
Admission: RE | Disposition: A | Discharge status: Home / Self Care | Payer: Self-pay | Source: Home / Self Care | Attending: Internal Medicine

## 2022-03-23 ENCOUNTER — Encounter (HOSPITAL_COMMUNITY): Payer: Self-pay | Admitting: Internal Medicine

## 2022-03-23 ENCOUNTER — Ambulatory Visit (HOSPITAL_COMMUNITY)
Admission: RE | Admit: 2022-03-23 | Discharge: 2022-03-23 | Disposition: A | Discharge status: Home / Self Care | Payer: PPO | Attending: Internal Medicine | Admitting: Internal Medicine

## 2022-03-23 ENCOUNTER — Ambulatory Visit (HOSPITAL_COMMUNITY): Payer: PPO | Admitting: Anesthesiology

## 2022-03-23 ENCOUNTER — Other Ambulatory Visit: Payer: Self-pay

## 2022-03-23 ENCOUNTER — Ambulatory Visit (HOSPITAL_BASED_OUTPATIENT_CLINIC_OR_DEPARTMENT_OTHER): Payer: PPO | Admitting: Anesthesiology

## 2022-03-23 DIAGNOSIS — Z86010 Personal history of colonic polyps: Secondary | ICD-10-CM

## 2022-03-23 DIAGNOSIS — Z09 Encounter for follow-up examination after completed treatment for conditions other than malignant neoplasm: Secondary | ICD-10-CM | POA: Diagnosis not present

## 2022-03-23 DIAGNOSIS — K648 Other hemorrhoids: Secondary | ICD-10-CM | POA: Insufficient documentation

## 2022-03-23 DIAGNOSIS — D509 Iron deficiency anemia, unspecified: Secondary | ICD-10-CM

## 2022-03-23 DIAGNOSIS — Z800 Family history of malignant neoplasm of digestive organs: Secondary | ICD-10-CM | POA: Insufficient documentation

## 2022-03-23 DIAGNOSIS — M1990 Unspecified osteoarthritis, unspecified site: Secondary | ICD-10-CM | POA: Insufficient documentation

## 2022-03-23 DIAGNOSIS — Z1211 Encounter for screening for malignant neoplasm of colon: Secondary | ICD-10-CM | POA: Diagnosis not present

## 2022-03-23 DIAGNOSIS — I10 Essential (primary) hypertension: Secondary | ICD-10-CM | POA: Insufficient documentation

## 2022-03-23 DIAGNOSIS — D124 Benign neoplasm of descending colon: Secondary | ICD-10-CM

## 2022-03-23 DIAGNOSIS — K5730 Diverticulosis of large intestine without perforation or abscess without bleeding: Secondary | ICD-10-CM

## 2022-03-23 DIAGNOSIS — Z79899 Other long term (current) drug therapy: Secondary | ICD-10-CM | POA: Diagnosis not present

## 2022-03-23 DIAGNOSIS — F419 Anxiety disorder, unspecified: Secondary | ICD-10-CM | POA: Insufficient documentation

## 2022-03-23 DIAGNOSIS — K635 Polyp of colon: Secondary | ICD-10-CM

## 2022-03-23 DIAGNOSIS — Z87891 Personal history of nicotine dependence: Secondary | ICD-10-CM | POA: Insufficient documentation

## 2022-03-23 DIAGNOSIS — K5900 Constipation, unspecified: Secondary | ICD-10-CM

## 2022-03-23 DIAGNOSIS — K219 Gastro-esophageal reflux disease without esophagitis: Secondary | ICD-10-CM | POA: Diagnosis not present

## 2022-03-23 DIAGNOSIS — J45909 Unspecified asthma, uncomplicated: Secondary | ICD-10-CM | POA: Insufficient documentation

## 2022-03-23 HISTORY — PX: POLYPECTOMY: SHX5525

## 2022-03-23 HISTORY — PX: COLONOSCOPY WITH PROPOFOL: SHX5780

## 2022-03-23 SURGERY — COLONOSCOPY WITH PROPOFOL
Anesthesia: General

## 2022-03-23 MED ORDER — LIDOCAINE HCL 1 % IJ SOLN
INTRAMUSCULAR | Status: DC | PRN
  Administered 2022-03-23: 50 mg via INTRADERMAL

## 2022-03-23 MED ORDER — LACTATED RINGERS IV SOLN: INTRAVENOUS | Status: DC | PRN

## 2022-03-23 MED ORDER — PROPOFOL 500 MG/50ML IV EMUL
INTRAVENOUS | Status: DC | PRN
  Administered 2022-03-23: 150 ug/kg/min via INTRAVENOUS

## 2022-03-23 NOTE — Discharge Instructions (Addendum)
  Colonoscopy Discharge Instructions  Read the instructions outlined below and refer to this sheet in the next few weeks. These discharge instructions provide you with general information on caring for yourself after you leave the hospital. Your doctor may also give you specific instructions. While your treatment has been planned according to the most current medical practices available, unavoidable complications occasionally occur. If you have any problems or questions after discharge, call Dr. Gala Romney at [phone]. ACTIVITY You may resume your regular activity, but move at a slower pace for the next 24 hours.  Take frequent rest periods for the next 24 hours.  Walking will help get rid of the air and reduce the bloated feeling in your belly (abdomen).  No driving for 24 hours (because of the medicine (anesthesia) used during the test).   Do not sign any important legal documents or operate any machinery for 24 hours (because of the anesthesia used during the test).  NUTRITION Drink plenty of fluids.  You may resume your normal diet as instructed by your doctor.  Begin with a light meal and progress to your normal diet. Heavy or fried foods are harder to digest and may make you feel sick to your stomach (nauseated).  Avoid alcoholic beverages for 24 hours or as instructed.  MEDICATIONS You may resume your normal medications unless your doctor tells you otherwise.  WHAT YOU CAN EXPECT TODAY Some feelings of bloating in the abdomen.  Passage of more gas than usual.  Spotting of blood in your stool or on the toilet paper.  IF YOU HAD POLYPS REMOVED DURING THE COLONOSCOPY: No aspirin products for 7 days or as instructed.  No alcohol for 7 days or as instructed.  Eat a soft diet for the next 24 hours.  FINDING OUT THE RESULTS OF YOUR TEST Not all test results are available during your visit. If your test results are not back during the visit, make an appointment with your caregiver to find out the  results. Do not assume everything is normal if you have not heard from your caregiver or the medical facility. It is important for you to follow up on all of your test results.  SEEK IMMEDIATE MEDICAL ATTENTION IF: You have more than a spotting of blood in your stool.  Your belly is swollen (abdominal distention).  You are nauseated or vomiting.  You have a temperature over 101.  You have abdominal pain or discomfort that is severe or gets worse throughout the day.    1 polyp removed from your colon today.  Hemorrhoid, diverticulosis and colon polyp information provided  Further recommendations to follow pending review of pathology report  Office visit with Neil Crouch in 6 weeks.  At patient request, I called Dyke Brackett at [phone] -rolled to voicemail.  Left a message.

## 2022-03-23 NOTE — Anesthesia Preprocedure Evaluation (Signed)
Anesthesia Evaluation  Patient identified by MRN, date of birth, ID band Patient awake    Reviewed: Allergy & Precautions, H&P , NPO status , Patient's Chart, lab work & pertinent test results  Airway Mallampati: II  TM Distance: >3 FB Neck ROM: Full    Dental  (+) Dental Advisory Given, Chipped, Caps   Pulmonary asthma , former smoker   Pulmonary exam normal breath sounds clear to auscultation       Cardiovascular Exercise Tolerance: Good hypertension, Pt. on medications Normal cardiovascular exam Rhythm:Regular Rate:Normal     Neuro/Psych  PSYCHIATRIC DISORDERS Anxiety     negative neurological ROS     GI/Hepatic Neg liver ROS,GERD  Medicated,,  Endo/Other  negative endocrine ROS    Renal/GU negative Renal ROS  negative genitourinary   Musculoskeletal  (+) Arthritis , Osteoarthritis,    Abdominal   Peds negative pediatric ROS (+)  Hematology  (+) Blood dyscrasia, anemia   Anesthesia Other Findings   Reproductive/Obstetrics negative OB ROS                             Anesthesia Physical Anesthesia Plan  ASA: 2  Anesthesia Plan: General   Post-op Pain Management: Minimal or no pain anticipated   Induction: Intravenous  PONV Risk Score and Plan: Propofol infusion  Airway Management Planned: Nasal Cannula and Natural Airway  Additional Equipment:   Intra-op Plan:   Post-operative Plan:   Informed Consent: I have reviewed the patients History and Physical, chart, labs and discussed the procedure including the risks, benefits and alternatives for the proposed anesthesia with the patient or authorized representative who has indicated his/her understanding and acceptance.     Dental advisory given  Plan Discussed with: CRNA and Surgeon  Anesthesia Plan Comments:        Anesthesia Quick Evaluation

## 2022-03-23 NOTE — Anesthesia Postprocedure Evaluation (Signed)
Anesthesia Post Note  Patient: Kendra Orozco  Procedure(s) Performed: COLONOSCOPY WITH PROPOFOL POLYPECTOMY  Patient location during evaluation: Short Stay Anesthesia Type: General Level of consciousness: awake and alert Pain management: pain level controlled Vital Signs Assessment: post-procedure vital signs reviewed and stable Respiratory status: spontaneous breathing Cardiovascular status: blood pressure returned to baseline Postop Assessment: no apparent nausea or vomiting Anesthetic complications: no   No notable events documented.   Last Vitals:  Vitals:   03/23/22 0748 03/23/22 0828  BP: 123/76 114/66  Pulse: 84 70  Resp: (!) 22 18  Temp: 36.6 C 36.7 C  SpO2: 100% 99%    Last Pain:  Vitals:   03/23/22 0828  TempSrc: Oral  PainSc: 0-No pain                 ,

## 2022-03-23 NOTE — H&P (Signed)
$'@LOGO'c$ @   Primary Care Physician:  Celene Squibb, MD Primary Gastroenterologist:  Dr. Gala Romney  Pre-Procedure History & Physical: HPI:  Kendra Orozco is a 75 y.o. female here for further evaluation of mild iron deficiency anemia in setting of a positive family history of colon cancer first-degree relative at a young age.  History of colon polyps.  Past Medical History:  Diagnosis Date   Angio-edema    Anxiety    Arthritis    Asthma    Cancer ([place])    skin and colon   GERD (gastroesophageal reflux disease)    Gout    Hypertension    Urticaria     Past Surgical History:  Procedure Laterality Date   ABDOMINAL HYSTERECTOMY     fibroid tumors   APPENDECTOMY     BACK SURGERY     BIOPSY  01/08/2022   Procedure: BIOPSY;  Surgeon: Daneil Dolin, MD;  Location: [facility];  Service: Endoscopy;;   BREAST CYST EXCISION Right    CATARACT EXTRACTION W/PHACO Right 11/20/2019   Procedure: CATARACT EXTRACTION PHACO AND INTRAOCULAR LENS PLACEMENT ([place]) RIGHT EYE;  Surgeon: Baruch Goldmann, MD;  Location: [facility];  Service: Ophthalmology;  Laterality: Right;  CDE: 17.30   CATARACT EXTRACTION W/PHACO Left 12/04/2019   Procedure: CATARACT EXTRACTION PHACO AND INTRAOCULAR LENS PLACEMENT LEFT EYE;  Surgeon: Baruch Goldmann, MD;  Location: [facility];  Service: Ophthalmology;  Laterality: Left;  CDE: 14.59   COLONOSCOPY  09/16/2007   ONG:EXBM canal hemorrhoids, otherwise normal rectum left-sided diverticula and colonic mucosa appeared normal.   COLONOSCOPY N/A 03/02/2013   Dr. Chauncy Lean adenoma. Colonic diverticulosis. Anal canal hemorrhoids-likely source of hematochezia. Surveillance 2019   COLONOSCOPY WITH PROPOFOL N/A 07/29/2017   Non-bleeding internal hemorrhoids, cecal AVMs.    ESOPHAGOGASTRODUODENOSCOPY  09/16/2007   WUX:[id number] undulating Z-line, tiny distal esophageal  erosions consistent with mild erosive reflux esophagitis, patulous EG junction, small hiatal hernia, otherwise normal  stomach, D1, and D2.   ESOPHAGOGASTRODUODENOSCOPY (EGD) WITH PROPOFOL N/A 01/08/2022   Procedure: ESOPHAGOGASTRODUODENOSCOPY (EGD) WITH PROPOFOL;  Surgeon: Daneil Dolin, MD;  Location: [facility];  Service: Endoscopy;  Laterality: N/A;  2:30 pm   HEMORRHOID SURGERY     X 2    MALONEY DILATION N/A 01/08/2022   Procedure: MALONEY DILATION;  Surgeon: Daneil Dolin, MD;  Location: [facility];  Service: Endoscopy;  Laterality: N/A;    Prior to Admission medications   Medication Sig Start Date End Date Taking? Authorizing Provider  allopurinol (ZYLOPRIM) 100 MG tablet Take 100 mg by mouth 3 (three) times daily. 09/19/20  Yes [provider]  budesonide-formoterol (SYMBICORT) 160-4.5 MCG/ACT inhaler Inhale 2 puffs into the lungs 2 (two) times daily. 10/06/20  Yes Valentina Shaggy, MD  dexlansoprazole (DEXILANT) 60 MG capsule Take 1 capsule (60 mg total) by mouth daily. 02/09/22  Yes Aliene Altes S, PA-C  docusate sodium (COLACE) 250 MG capsule Take 250 mg by mouth 2 (two) times a week.   Yes [provider]  hydrocortisone 2.5 % cream Apply 2.5 Applications topically daily as needed (hemorrhoids). 11/08/21  Yes [provider]  ibuprofen (ADVIL,MOTRIN) 200 MG tablet Take 400 mg by mouth daily as needed for mild pain or moderate pain (Back pain).   Yes [provider]  ipratropium (ATROVENT) 0.06 % nasal spray Place 2 sprays into both nostrils 2 (two) times daily. 11/08/21  Yes [provider]  ketoconazole (NIZORAL) 2 % cream Apply 1 Application topically daily  as needed for irritation. 07/06/20  Yes [provider]  levocetirizine (XYZAL) 5 MG tablet Take 5 mg by mouth at bedtime.   Yes [provider]  LORazepam (ATIVAN) 1 MG tablet Take 0.5 mg by mouth 2 (two) times daily.   Yes [provider]  lubiprostone (AMITIZA) 24 MCG capsule Take 24 mcg by mouth 2 (two) times daily with a meal.   Yes [provider]   Menthol-Methyl Salicylate (MUSCLE RUB) 10-15 % CREA Apply 1 application  topically daily as needed for muscle pain.   Yes [provider]  methocarbamol (ROBAXIN) 500 MG tablet Take 500 mg by mouth daily as needed for muscle spasms. 07/13/21  Yes [provider]  Multiple Vitamin (MULTIVITAMIN WITH MINERALS) TABS tablet Take 1 tablet by mouth daily.   Yes [provider]  olmesartan (BENICAR) 40 MG tablet Take 40 mg by mouth daily.   Yes [provider]  ondansetron (ZOFRAN) 4 MG tablet Take 1 tablet (4 mg total) by mouth every 6 (six) hours as needed for nausea. 06/07/20  Yes Kathie Dike, MD  Polyvinyl Alcohol-Povidone (REFRESH OP) Place 1 drop into both eyes daily as needed (dry eyes).   Yes [provider]  potassium chloride SA (KLOR-CON M) 20 MEQ tablet Take 10 mEq by mouth daily.   Yes [provider]  Wheat Dextrin (BENEFIBER) POWD Take 1 Package by mouth 2 (two) times daily.   Yes [provider]  albuterol (VENTOLIN HFA) 108 (90 Base) MCG/ACT inhaler Inhale 2 puffs into the lungs every 4 (four) hours as needed for wheezing or shortness of breath. 11/08/21   [provider]  Budeson-Glycopyrrol-Formoterol (BREZTRI AEROSPHERE) 160-9-4.8 MCG/ACT AERO Inhale 2 puffs into the lungs 2 (two) times daily.    [provider]  calcium carbonate (OS-CAL - DOSED IN MG OF ELEMENTAL CALCIUM) 1250 (500 Ca) MG tablet Chew 1 tablet by mouth 2 (two) times a week.     [provider]  torsemide (DEMADEX) 20 MG tablet Take 10 mg by mouth daily. 07/22/20   [provider]  triamcinolone (KENALOG) 0.1 % Apply 1 Application topically daily at 4 PM. 03/23/20   [provider]    Allergies as of 02/21/2022 - Review Complete 02/20/2022  Allergen Reaction Noted   Pantoprazole sodium  07/18/2017   Plastibase  01/04/2022   Latex Rash 02/16/2013   Sulfa antibiotics Swelling 02/11/2013    Family History   Problem Relation Age of Onset   Colon cancer Brother        39, deceased   Breast cancer Sister 26   Breast cancer Maternal Aunt 28   Heart attack Mother    Allergic rhinitis Mother    Allergic rhinitis Father     Social History   Socioeconomic History   Marital status: Divorced    Spouse name: Not on file   Number of children: Not on file   Years of education: Not on file   Highest education level: Not on file  Occupational History   Not on file  Tobacco Use   Smoking status: Former    Packs/day: 0.25    Years: 3.00    Total pack years: 0.75    Types: Cigarettes   Smokeless tobacco: Never  Vaping Use   Vaping Use: Never used  Substance and Sexual Activity   Alcohol use: Yes    Comment: occ   Drug use: No   Sexual activity: Not Currently    Birth  control/protection: Surgical  Other Topics Concern   Not on file  Social History Narrative   Not on file   Social Determinants of Health   Financial Resource Strain: Not on file  Food Insecurity: Not on file  Transportation Needs: Not on file  Physical Activity: Not on file  Stress: Not on file  Social Connections: Not on file  Intimate Partner Violence: Not on file    Review of Systems: See HPI, otherwise negative ROS  Physical Exam: There were no vitals taken for this visit. General:   Alert,  Well-developed, well-nourished, pleasant and cooperative in NAD Neck:  Supple; no masses or thyromegaly. No significant cervical adenopathy. Lungs:  Clear throughout to auscultation.   No wheezes, crackles, or rhonchi. No acute distress. Heart:  Regular rate and rhythm; no murmurs, clicks, rubs,  or gallops. Abdomen: Non-distended, normal bowel sounds.  Soft and nontender without appreciable mass or hepatosplenomegaly.  Pulses:  Normal pulses noted. Extremities:  Without clubbing or edema.  Impression/Plan: 76 year old lady with mild iron deficiency anemia.  Striking family history of colon cancer.  Here for  colonoscopy.  History of colon polyps.  No evidence of GI bleeding.  I will offer the patient a colonoscopy today.  The risks, benefits, limitations, alternatives and imponderables have been reviewed with the patient. Questions have been answered. All parties are agreeable.       Notice: This dictation was prepared with Dragon dictation along with smaller phrase technology. Any transcriptional errors that result from this process are unintentional and may not be corrected upon review.

## 2022-03-23 NOTE — Transfer of Care (Signed)
Immediate Anesthesia Transfer of Care Note  Patient: Kendra Orozco  Procedure(s) Performed: COLONOSCOPY WITH PROPOFOL POLYPECTOMY  Patient Location: Short Stay  Anesthesia Type:General  Level of Consciousness: awake  Airway & Oxygen Therapy: Patient Spontanous Breathing  Post-op Assessment: Report given to RN  Post vital signs: Reviewed and stable  Last Vitals:  Vitals Value Taken Time  BP 114/66 03/23/22 0828  Temp 36.7 C 03/23/22 0828  Pulse 70 03/23/22 0828  Resp 18 03/23/22 0828  SpO2 99 % 03/23/22 0828    Last Pain:  Vitals:   03/23/22 0828  TempSrc: Oral  PainSc: 0-No pain         Complications: No notable events documented.

## 2022-03-23 NOTE — Op Note (Signed)
[facility] Patient Name: Kendra Orozco Procedure Date: 03/23/2022 7:37 AM MRN: [id number] Date of Birth: 1945-07-24 Attending MD: Norvel Richards , MD, [id number] CSN: [id number] Age: 76 Admit Type: Outpatient Procedure:                Colonoscopy Indications:              High risk colon cancer surveillance: Personal                            history of colonic polyps Providers:                Norvel Richards, MD, Lurline Del, RN, Everardo Pacific Referring MD:              Medicines:                Propofol per Anesthesia Complications:            No immediate complications. Estimated Blood Loss:     Estimated blood loss: none. Procedure:                Pre-Anesthesia Assessment:                           - Prior to the procedure, a History and Physical                            was performed, and patient medications and                            allergies were reviewed. The patient's tolerance of                            previous anesthesia was also reviewed. The risks                            and benefits of the procedure and the sedation                            options and risks were discussed with the patient.                            All questions were answered, and informed consent                            was obtained. Prior Anticoagulants: The patient has                            taken no anticoagulant or antiplatelet agents. ASA                            Grade Assessment: II - A patient with mild systemic  disease. After reviewing the risks and benefits,                            the patient was deemed in satisfactory condition to                            undergo the procedure.                           After obtaining informed consent, the colonoscope                            was passed under direct vision. Throughout the                            procedure, the patient's blood  pressure, pulse, and                            oxygen saturations were monitored continuously. The                            [id number]) scope was introduced through the                            anus and advanced to the the cecum, identified by                            appendiceal orifice and ileocecal valve. The                            colonoscopy was performed without difficulty. The                            patient tolerated the procedure well. The quality                            of the bowel preparation was adequate. The                            ileocecal valve, appendiceal orifice, and rectum                            were photographed. The colonoscopy was performed                            without difficulty. The patient tolerated the                            procedure well. The quality of the bowel                            preparation was adequate. Scope In: 8:06:05 AM Scope Out: 8:21:19 AM Scope Withdrawal Time: 0 hours 6 minutes 50 seconds  Total Procedure Duration: 0 hours 15 minutes 14 seconds  Findings:      The perianal and digital rectal examinations were normal.      A 4 mm polyp was found in the descending colon. The polyp was sessile.       The polyp was removed with a cold snare. Resection and retrieval were       complete. Estimated blood loss was minimal.      A few medium-mouthed diverticula were found in the entire colon. (1) 5       mm cecal AVM?"appeared innocent. Somewhat friable grade 2 hemorrhoids. Impression:               - One 4 mm polyp in the descending colon, removed                            with a cold snare. Resected and retrieved.                           - Diverticulosis in the entire examined colon.                            Innocent appearing 5 mm cecal AVM. Friable internal                            hemorrhoids. Moderate Sedation:      Moderate (conscious) sedation was personally administered by an        anesthesia professional. The following parameters were monitored: oxygen       saturation, heart rate, blood pressure, respiratory rate, EKG, adequacy       of pulmonary ventilation, and response to care. Recommendation:           - Patient has a contact number available for                            emergencies. The signs and symptoms of potential                            delayed complications were discussed with the                            patient. Return to normal activities tomorrow.                            Written discharge instructions were provided to the                            patient.                           - Advance diet as tolerated.                           - Continue present medications.                           - Repeat colonoscopy date to be determined after  pending pathology results are reviewed for                            surveillance.                           - Return to GI office in 6 weeks. Procedure Code(s):        --- Professional ---                           [phone], Colonoscopy, flexible; with removal of                            tumor(s), polyp(s), or other lesion(s) by snare                            technique Diagnosis Code(s):        --- Professional ---                           Z86.010, Personal history of colonic polyps                           D12.4, Benign neoplasm of descending colon                           K57.30, Diverticulosis of large intestine without                            perforation or abscess without bleeding CPT copyright 2022 American Medical Association. All rights reserved. The codes documented in this report are preliminary and upon coder review may  be revised to meet current compliance requirements. Cristopher Estimable. , MD Norvel Richards, MD 03/23/2022 8:39:22 AM This report has been signed electronically. Number of Addenda: 0

## 2022-03-26 LAB — SURGICAL PATHOLOGY

## 2022-03-27 ENCOUNTER — Encounter: Payer: Self-pay | Admitting: Internal Medicine

## 2022-03-30 ENCOUNTER — Encounter (HOSPITAL_COMMUNITY): Payer: Self-pay | Admitting: Internal Medicine

## 2022-03-30 DIAGNOSIS — R748 Abnormal levels of other serum enzymes: Secondary | ICD-10-CM | POA: Diagnosis not present

## 2022-03-30 DIAGNOSIS — D509 Iron deficiency anemia, unspecified: Secondary | ICD-10-CM | POA: Diagnosis not present

## 2022-03-30 DIAGNOSIS — E782 Mixed hyperlipidemia: Secondary | ICD-10-CM | POA: Diagnosis not present

## 2022-05-02 ENCOUNTER — Encounter: Payer: Self-pay | Admitting: Internal Medicine

## 2022-05-23 ENCOUNTER — Other Ambulatory Visit: Payer: Self-pay

## 2022-05-23 ENCOUNTER — Ambulatory Visit (INDEPENDENT_AMBULATORY_CARE_PROVIDER_SITE_OTHER): Payer: PPO | Admitting: Allergy & Immunology

## 2022-05-23 ENCOUNTER — Encounter: Payer: Self-pay | Admitting: Allergy & Immunology

## 2022-05-23 VITALS — BP 130/76 | HR 98 | Temp 98.0°F | Resp 20 | Ht 63.0 in | Wt 209.0 lb

## 2022-05-23 DIAGNOSIS — J310 Chronic rhinitis: Secondary | ICD-10-CM

## 2022-05-23 DIAGNOSIS — J4540 Moderate persistent asthma, uncomplicated: Secondary | ICD-10-CM | POA: Diagnosis not present

## 2022-05-23 DIAGNOSIS — R21 Rash and other nonspecific skin eruption: Secondary | ICD-10-CM | POA: Diagnosis not present

## 2022-05-23 MED ORDER — ALBUTEROL SULFATE HFA 108 (90 BASE) MCG/ACT IN AERS: 2.0000 | INHALATION_SPRAY | RESPIRATORY_TRACT | 1 refills | Status: DC | PRN

## 2022-05-23 MED ORDER — IPRATROPIUM BROMIDE 0.06 % NA SOLN: 2.0000 | Freq: Two times a day (BID) | NASAL | 5 refills | Status: DC

## 2022-05-23 MED ORDER — BREZTRI AEROSPHERE 160-9-4.8 MCG/ACT IN AERO: 2.0000 | INHALATION_SPRAY | Freq: Two times a day (BID) | RESPIRATORY_TRACT | 5 refills | Status: DC

## 2022-05-23 MED ORDER — SPACER/AERO-HOLDING CHAMBERS DEVI: 1.0000 | 1 refills | Status: DC

## 2022-05-23 NOTE — Patient Instructions (Addendum)
1. Moderate persistent asthma, uncomplicated - with oral candidiasis - Lung testing looks decent today. - We are providing you with a sample of Breztri, which contains Symbicort +a third medicine to help with breathing. - This seems to be level to 2-year with your insurance. - Call us in 1 week with an update on how this is working. - Daily controller medication(s): Breztri two puffs twice daily with spacer - Prior to physical activity: albuterol 2 puffs 10-15 minutes before physical activity. - Rescue medications: albuterol 4 puffs every 4-6 hours as needed - Asthma control goals:  * Full participation in all desired activities (may need albuterol before activity) * Albuterol use two time or less a week on average (not counting use with activity) * Cough interfering with sleep two time or less a month * Oral steroids no more than once a year * No hospitalizations  2. Chronic rhinitis - I am glad that your nose is doing well. - We are not going to make any changes. - Continue with: ipratropium one spray per nostril twice daily as needed like you are doing.   3. Return in about 3 months (around 08/21/2022).    Please inform us of any Emergency Department visits, hospitalizations, or changes in symptoms. Call us before going to the ED for breathing or allergy symptoms since we might be able to fit you in for a sick visit. Feel free to contact us anytime with any questions, problems, or concerns.  It was a pleasure to see you again today!  Websites that have reliable patient information: 1. American Academy of Asthma, Allergy, and Immunology: www.aaaai.org 2. Food Allergy Research and Education (FARE): foodallergy.org 3. Mothers of Asthmatics: http://www.asthmacommunitynetwork.org 4. American College of Allergy, Asthma, and Immunology: www.acaai.org   COVID-19 Vaccine Information can be found at: ShippingScam.co.uk For questions  related to vaccine distribution or appointments, please email [email or web address] or call [phone].   We realize that you might be concerned about having an allergic reaction to the COVID19 vaccines. To help with that concern, WE ARE OFFERING THE COVID19 VACCINES IN OUR OFFICE! Ask the front desk for dates!     "Like" [place] on Facebook and Instagram for our latest updates!      A healthy democracy works best when [employer] participate! Make sure you are registered to vote! If you have moved or changed any of your contact information, you will need to get this updated before voting!  In some cases, you MAY be able to register to vote online: [email or web address]

## 2022-05-23 NOTE — Progress Notes (Signed)
FOLLOW UP  Date of Service/Encounter:  05/23/22   Assessment:   Mild intermittent asthma, uncomplicated   Chronic non-allergic rhinitis   Food intolerance - resolved   Rash - resolved  GERD with multiple gastric polyps - on Dexilant 60 mg   Kendra Orozco presents for follow-up visit.  She actually is doing well symptomatically, but her insurance will no longer cover Symbicort.  We are going to give her a sample of Breztri to see if this will help control her symptoms adequately.  I did explain to her that Judithann Sauger contains Symbicort plus a third medicine to help with breathing.  She is going to give this a try and call us back in a week.  Her symptoms are otherwise very well-controlled with the current set of medications that we have.  Plan/Recommendations:   1. Moderate persistent asthma, uncomplicated - with oral candidiasis - Lung testing looks decent today. - We are providing you with a sample of Breztri, which contains Symbicort +a third medicine to help with breathing. - This seems to be level to 2-year with your insurance. - Call us in 1 week with an update on how this is working. - Daily controller medication(s): Breztri two puffs twice daily with spacer - Prior to physical activity: albuterol 2 puffs 10-15 minutes before physical activity. - Rescue medications: albuterol 4 puffs every 4-6 hours as needed - Asthma control goals:  * Full participation in all desired activities (may need albuterol before activity) * Albuterol use two time or less a week on average (not counting use with activity) * Cough interfering with sleep two time or less a month * Oral steroids no more than once a year * No hospitalizations  2. Chronic rhinitis - I am glad that your nose is doing well. - We are not going to make any changes. - Continue with: ipratropium one spray per nostril twice daily as needed like you are doing.   3. Return in about 3 months (around 08/21/2022).    Subjective:    Kendra Orozco is a 77 y.o. female presenting today for follow up of  Chief Complaint  Patient presents with   Other    Pt states she needs approval for symbicort.   asthma    Pt states her asthma is doing  pretty good but still having some whezzing at night.    Kendra Orozco has a history of the following: Patient Active Problem List   Diagnosis Date Noted   IDA (iron deficiency anemia) 02/20/2022   Elevated alkaline phosphatase level 02/20/2022   Dysphagia 12/14/2021   Hyponatremia 06/06/2020   COVID-19 virus infection 06/06/2020   HTN (hypertension) 06/06/2020   Prolapsed internal hemorrhoids, grade 3 05/12/2020   Constipation 04/15/2013   Rectal bleeding 02/11/2013   Gastroesophageal reflux disease 02/11/2013    History obtained from: chart review and patient.  Kendra Orozco is a 77 y.o. female presenting for a follow up visit.  We last saw her in April 2023.  At that time, we worked on getting her back on Symbicort.  She had been on Advair with resulting hoarseness.  We started her on nystatin for 1 week to help clear this up.  We continued Symbicort 160 mcg 1 puff twice daily as well as albuterol as needed.  For her rhinitis, her nose was doing well.  We continue with ipratropium as needed.  Since last visit, she has done really well.   Asthma/Respiratory Symptom History: She has been on the Symbicort  two puffs BID through 2023. But now the insurance company is not going to pay for it.  She has failed Advair and Breo.  Not been on prednisone at all.  She has not had any asthma exacerbations while on Symbicort.  She does not think that she has been on Spiriva when I show her this.  She has not required prednisone and has not been to the emergency room for symptoms.  She is having minimal nighttime symptoms.  She denies nighttime coughing and wheezing.  Allergic Rhinitis Symptom History: Her rhinitis symptoms have been under good control with ipratropium nasal spray.  She has not  had any sinus infections requiring antibiotics.  Food Allergy Symptom History: She is not avoiding any foods at all at this point in time.  Skin Symptom History: Rash has not been an issue at this point in time.  She was seeing Dr. Tarri Glenn, but has not been back over a year.  She had an endoscopy performed in September 2023.  She had multiple hyperplastic polyps throughout the gastric mucosa.  There was a small hiatal hernia.  She was started on Dexilant 60 mg after the procedure.  This has improved her dysphagia.  She recently had a colonoscopy in December 2023.  At that time, she had a polyp found in the descending colon.  It was removed.  She had a few medium mouthed diverticula in the entire colon.  She had an arteriovenous malformation which appeared benign.  Otherwise, there have been no changes to her past medical history, surgical history, family history, or social history.    Review of Systems  Constitutional: Negative.  Negative for chills, fever, malaise/fatigue and weight loss.  HENT:  Negative for congestion, ear discharge, ear pain and sinus pain.   Eyes:  Negative for pain, discharge and redness.  Respiratory:  Negative for cough, sputum production, shortness of breath and wheezing.   Cardiovascular:  Negative for chest pain and palpitations.  Gastrointestinal:  Negative for abdominal pain, constipation, diarrhea, heartburn, nausea and vomiting.  Skin: Negative.  Negative for itching and rash.  Neurological:  Negative for dizziness and headaches.  Endo/Heme/Allergies:  Positive for environmental allergies. Does not bruise/bleed easily.  All other systems reviewed and are negative.      Objective:   Blood pressure 130/76, pulse 98, temperature 98 F (36.7 C), resp. rate 20, height '5\' 3"'$  (1.6 m), weight 209 lb (94.8 kg), SpO2 96 %. Body mass index is 37.02 kg/m.    Physical Exam Vitals reviewed.  Constitutional:      Appearance: She is well-developed.      Comments: Smiling and pleasant. Talkative.   HENT:     Head: Normocephalic and atraumatic.     Right Ear: Tympanic membrane, ear canal and external ear normal.     Left Ear: Tympanic membrane, ear canal and external ear normal.     Nose: No nasal deformity, septal deviation, mucosal edema or rhinorrhea.     Right Turbinates: Enlarged, swollen and pale.     Left Turbinates: Enlarged, swollen and pale.     Right Sinus: No maxillary sinus tenderness or frontal sinus tenderness.     Left Sinus: No maxillary sinus tenderness or frontal sinus tenderness.     Mouth/Throat:     Mouth: Mucous membranes are not pale and not dry.     Pharynx: Uvula midline.  Eyes:     General:        Right eye: No discharge.  Left eye: No discharge.     Conjunctiva/sclera: Conjunctivae normal.     Right eye: Right conjunctiva is not injected. No chemosis.    Left eye: Left conjunctiva is not injected. No chemosis.    Pupils: Pupils are equal, round, and reactive to light.  Cardiovascular:     Rate and Rhythm: Normal rate and regular rhythm.     Heart sounds: Normal heart sounds.  Pulmonary:     Effort: Pulmonary effort is normal. No tachypnea, accessory muscle usage or respiratory distress.     Breath sounds: Normal breath sounds. No wheezing, rhonchi or rales.     Comments: Moving air well in all lung fields. No increased work of breathing noted.  Chest:     Chest wall: No tenderness.  Lymphadenopathy:     Cervical: No cervical adenopathy.  Skin:    General: Skin is warm.     Capillary Refill: Capillary refill takes less than 2 seconds.     Coloration: Skin is not pale.     Findings: No abrasion, erythema, petechiae or rash. Rash is not papular, urticarial or vesicular.     Comments: No eczematous or urticarial lesions noted.   Neurological:     Mental Status: She is alert.  Psychiatric:        Behavior: Behavior is cooperative.      Diagnostic studies:    Spirometry: results normal (FEV1:  1.39/71%, FVC: 2.01/79%, FEV1/FVC: 69%).    Spirometry consistent with normal pattern.   Allergy Studies: none       Salvatore Marvel, MD  [facility] [place] [facility]

## 2022-05-25 MED ORDER — IPRATROPIUM BROMIDE 0.06 % NA SOLN: 2.0000 | Freq: Two times a day (BID) | NASAL | 5 refills | Status: DC

## 2022-05-25 MED ORDER — ALBUTEROL SULFATE HFA 108 (90 BASE) MCG/ACT IN AERS: 2.0000 | INHALATION_SPRAY | RESPIRATORY_TRACT | 1 refills | Status: DC | PRN

## 2022-05-25 MED ORDER — SPACER/AERO-HOLDING CHAMBERS DEVI: 1.0000 | 1 refills | Status: AC

## 2022-05-25 MED ORDER — BREZTRI AEROSPHERE 160-9-4.8 MCG/ACT IN AERO: 2.0000 | INHALATION_SPRAY | Freq: Two times a day (BID) | RESPIRATORY_TRACT | 5 refills | Status: DC

## 2022-05-25 NOTE — Addendum Note (Signed)
Addended by: Clovis Cao A on: 05/25/2022 11:52 AM   Modules accepted: Orders

## 2022-05-29 ENCOUNTER — Encounter: Payer: Self-pay | Admitting: Physician Assistant

## 2022-05-29 ENCOUNTER — Ambulatory Visit: Payer: PPO | Attending: Physician Assistant | Admitting: Physician Assistant

## 2022-05-29 VITALS — BP 126/74 | HR 93 | Ht 63.5 in | Wt 208.4 lb

## 2022-05-29 DIAGNOSIS — I10 Essential (primary) hypertension: Secondary | ICD-10-CM | POA: Diagnosis not present

## 2022-05-29 DIAGNOSIS — I491 Atrial premature depolarization: Secondary | ICD-10-CM | POA: Diagnosis not present

## 2022-05-29 DIAGNOSIS — R0789 Other chest pain: Secondary | ICD-10-CM | POA: Diagnosis not present

## 2022-05-29 DIAGNOSIS — E782 Mixed hyperlipidemia: Secondary | ICD-10-CM | POA: Diagnosis not present

## 2022-05-29 NOTE — Patient Instructions (Signed)
Medication Instructions:  Your physician recommends that you continue on your current medications as directed. Please refer to the Current Medication list given to you today.   Labwork: None today  Testing/Procedures: None today  Follow-Up: 1 year Dr.Branch  Any Other Special Instructions Will Be Listed Below (If Applicable).  If you need a refill on your cardiac medications before your next appointment, please call your pharmacy.

## 2022-05-29 NOTE — Progress Notes (Signed)
Cardiology Office Note:    Date:  05/29/2022   ID:  Kendra Orozco, DOB 1945/06/08, MRN [id number]  PCP:  Celene Squibb, MD  [facility] Cardiologist:  Carlyle Dolly, MD     Referring MD: Celene Squibb, MD   Chief Complaint:  Follow-up     History of Present Illness:   Kendra Orozco is a 77 y.o. female with long history of chest pain described as squeezing in her mid chest occurs at rest or exertion 2-3 times a year last about 5 minutes can get better with leaning forward, hypertension, PACs.  Patient saw Dr. Harl Bowie last 2022 chest pains and frequent.  She had irregular heartbeats on exam EKG done showed normal sinus rhythm with PACs.  No changes made.     Patient thinks her chest pain is muscle related. It occurs when driving or sleeping on her side, washing dishes or using her arms and goes away with taking an ASA. It is sore when she rubs it.  Occurs 2-3 times in 6 months. She walks a lot-at least 1/2 mile daily-no symptoms when walking. Occasional palpitations if she overexerts. Has a lot of muscle aches and pains.  Had blood work by Dr. Nevada Crane in Jan. Has been out of allergies meds for a month. Has been taking benadryl. Has chronic asthma.       Past Medical History:  Diagnosis Date   Angio-edema    Anxiety    Arthritis    Asthma    Cancer ([place])    skin and colon   GERD (gastroesophageal reflux disease)    Gout    Hypertension    Urticaria    Current Medications: Current Meds  Medication Sig   albuterol (VENTOLIN HFA) 108 (90 Base) MCG/ACT inhaler Inhale 2 puffs into the lungs every 4 (four) hours as needed for wheezing or shortness of breath.   allopurinol (ZYLOPRIM) 100 MG tablet Take 100 mg by mouth 3 (three) times daily.   Budeson-Glycopyrrol-Formoterol (BREZTRI AEROSPHERE) 160-9-4.8 MCG/ACT AERO Inhale 2 puffs into the lungs 2 (two) times daily.   calcium carbonate (OS-CAL - DOSED IN MG OF ELEMENTAL CALCIUM) 1250 (500 Ca) MG tablet Chew 1  tablet by mouth 2 (two) times a week.    dexlansoprazole (DEXILANT) 60 MG capsule Take 1 capsule (60 mg total) by mouth daily.   docusate sodium (COLACE) 250 MG capsule Take 250 mg by mouth 2 (two) times a week.   hydrocortisone 2.5 % cream Apply 2.5 Applications topically daily as needed (hemorrhoids).   ibuprofen (ADVIL,MOTRIN) 200 MG tablet Take 400 mg by mouth daily as needed for mild pain or moderate pain (Back pain).   ipratropium (ATROVENT) 0.06 % nasal spray Place 2 sprays into both nostrils 2 (two) times daily.   ketoconazole (NIZORAL) 2 % cream Apply 1 Application topically daily as needed for irritation.   levocetirizine (XYZAL) 5 MG tablet Take 5 mg by mouth at bedtime.   LORazepam (ATIVAN) 1 MG tablet Take 0.5 mg by mouth 2 (two) times daily.   lubiprostone (AMITIZA) 24 MCG capsule Take 24 mcg by mouth 2 (two) times daily with a meal.   Menthol-Methyl Salicylate (MUSCLE RUB) 10-15 % CREA Apply 1 application  topically daily as needed for muscle pain.   methocarbamol (ROBAXIN) 500 MG tablet Take 500 mg by mouth daily as needed for muscle spasms.   Multiple Vitamin (MULTIVITAMIN WITH MINERALS) TABS tablet Take 1 tablet by mouth daily.   olmesartan (BENICAR)  40 MG tablet Take 40 mg by mouth daily.   ondansetron (ZOFRAN) 4 MG tablet Take 1 tablet (4 mg total) by mouth every 6 (six) hours as needed for nausea.   Polyvinyl Alcohol-Povidone (REFRESH OP) Place 1 drop into both eyes daily as needed (dry eyes).   potassium chloride SA (KLOR-CON M) 20 MEQ tablet Take 10 mEq by mouth daily.   Spacer/Aero-Holding Chambers DEVI 1 Device by Does not apply route as directed.   torsemide (DEMADEX) 20 MG tablet Take 10 mg by mouth daily.   triamcinolone (KENALOG) 0.1 % Apply 1 Application topically daily at 4 PM.   Wheat Dextrin (BENEFIBER) POWD Take 1 Package by mouth 2 (two) times daily.    Allergies:   Pantoprazole sodium, Plastibase, Latex, and Sulfa antibiotics   Social History   Tobacco  Use   Smoking status: Former    Packs/day: 0.25    Years: 3.00    Total pack years: 0.75    Types: Cigarettes   Smokeless tobacco: Never  Vaping Use   Vaping Use: Never used  Substance Use Topics   Alcohol use: Yes    Comment: occ   Drug use: No    Family Hx: The patient's family history includes Allergic rhinitis in her father and mother; Breast cancer (age of onset: 26) in her maternal aunt; Breast cancer (age of onset: 46) in her sister; Colon cancer in her brother; Heart attack in her mother.  ROS     Physical Exam:    VS:  BP 126/74   Pulse 93   Ht 5' 3.5" (1.613 m)   Wt 208 lb 6.4 oz (94.5 kg)   SpO2 97%   BMI 36.34 kg/m     Wt Readings from Last 3 Encounters:  05/29/22 208 lb 6.4 oz (94.5 kg)  05/23/22 209 lb (94.8 kg)  03/20/22 204 lb 9.4 oz (92.8 kg)    Physical Exam  GEN: Obese, in no acute distress  Neck: no JVD, carotid bruits, or masses Cardiac:RRR; no murmurs, rubs, or gallops  Respiratory:  clear to auscultation bilaterally, normal work of breathing GI: soft, nontender, nondistended, + BS Ext: without cyanosis, clubbing, or edema, Good distal pulses bilaterally Neuro:  Alert and Oriented x 3,  Psych: euthymic mood, full affect        EKGs/Labs/Other Test Reviewed:    EKG:  EKG is  not ordered today.   Recent Labs: 03/20/2022: BUN 24; Creatinine, Ser 0.79; Potassium 3.5; Sodium 138   Recent Lipid Panel No results for input(s): "CHOL", "TRIG", "HDL", "VLDL", "LDLCALC", "LDLDIRECT" in the last 8760 hours.   Prior CV Studies:       Risk Assessment/Calculations/Metrics:              ASSESSMENT & PLAN:   No problem-specific Assessment & Plan notes found for this encounter.   Chest pain-long history, infrequent-2-3 times in 6 months. Continue to monitor if any increase in symptoms.  HTN-BP well controlled on benicar and torsemide  PAC's-only occasional palpitations.  HLD-LDL 99 02/2022 not on any treatment. Diet and exercise.             Dispo:  No follow-ups on file.   Medication Adjustments/Labs and Tests Ordered: Current medicines are reviewed at length with the patient today.  Concerns regarding medicines are outlined above.  Tests Ordered: No orders of the defined types were placed in this encounter.  Medication Changes: No orders of the defined types were placed in this encounter.  Signed,  Ermalinda Barrios, PA-C  05/29/2022 12:49 PM    [address], [place], [place]  [address] Phone: [phone]; Fax: [phone]

## 2022-06-01 ENCOUNTER — Telehealth: Payer: Self-pay

## 2022-06-01 MED ORDER — BUDESONIDE-FORMOTEROL FUMARATE 160-4.5 MCG/ACT IN AERO: 2.0000 | INHALATION_SPRAY | Freq: Two times a day (BID) | RESPIRATORY_TRACT | 3 refills | Status: DC

## 2022-06-01 NOTE — Telephone Encounter (Signed)
I have sent in the Symbicort 180 two inhalations BID.  Can a PA be stared as she she has tried and failed Breztri and had an allergic reaction. Thank you!

## 2022-06-01 NOTE — Addendum Note (Signed)
Addended by: Clovis Cao A on: 06/01/2022 01:53 PM   Modules accepted: Orders

## 2022-06-01 NOTE — Telephone Encounter (Signed)
Called and spoke to patient as Kendra Orozco called and expressed that patient called and left a message on her line expressing that she was trying to reach the [place] office. While speaking to patient she was short of breath and expressed that it hurst to speak, breathe, and she is experiencing chest , throat, and muscle tightness. Patient expressed that when she take the [facility] that when her throat closes up and she has to use rescue inhaler. I asked if she has taken the [facility] today and she expressed that she did and now she's if feeling worse as far as the throat swelling and chest tightness. I put patient on a brief hold and verbally expressed what patient was telling me to Dr. Ernst Bowler . Dr. Ernst Bowler informed me to tell her to go right to the ED. I informed patient of his advise and she began to get very irate and expressed that she knows that this is an allergic reaction and she she is just going to use her albuterol. I informed her that the albuterol will help help with a active allergic reaction and that she needed medication to reverse the reaction. She then began yelling and taking over me and expressed that she was tired of the doctors office and she doesn't feel like going to the ED. I apologized that she fel this way about the practice and informed her that allergic reaction can be fatal and that per Dr. Ernst Bowler professional opinion she should be evaluated by an ED physical who to treat her accordingly. Patient expressed while yelling that she did not and will not go as she knows she will be sitting in the ED all day. I informed patient that although we are expressing what is best for her at this time and per the request of the provider it is ultimately her choice. Patient expressed that she would call the ED herself to see if it was necessary to be seen. I informed her that it was her decision but per Dr. Ernst Bowler he request for her to be evaluated by a physician at the ED.

## 2022-06-01 NOTE — Telephone Encounter (Signed)
I called the patient. She reports that she had a reaction when she was asleep and her "throat closed up". This responded to the albuterol. She reports that her muscle tightened in her throat really badly. She tested her O2 level and this was 98% today.  She did take a nerve pill 30 minutes ago and this calmed her nerves up.   She thinks that this is a reaction to her sinuses and post nasal drip. She has had sinus pressure last night.   She feels that the [place] makes her heart beat fast which leads to feel nervous.   She has a baseline issues with her throat and nerves in her throat. She sees a throat specialists (Dr. Benjamine Mola). She is on the same nasal spray BID which she is using without a problem. She thinks that she must have reacted to that glyocopyrrolate in the [place]. She has been on Advair and Breo and neither of these worked well. The Symbicort is the thing that has worked.   She was on Symbicort in the past without a problem. She was on that for years without a problem. She would like a PA to be filled out for that. She took a lorazepam to help with this. She took a half pill this time.

## 2022-06-02 NOTE — Telephone Encounter (Signed)
Thank you for taking such excellent care of our patients, [place]!   Salvatore Marvel, MD [facility] [place] [facility] [place]

## 2022-06-06 ENCOUNTER — Telehealth: Payer: Self-pay

## 2022-06-06 ENCOUNTER — Other Ambulatory Visit (HOSPITAL_COMMUNITY): Payer: Self-pay

## 2022-06-06 NOTE — Telephone Encounter (Signed)
PA request received from provider for Symbicort 160-4.5MCG/ACT aerosol  PA has been submitted via CMM to [place] [employer] and is pending determination.  Key: [employer]

## 2022-06-06 NOTE — Telephone Encounter (Signed)
PA for Symbicort has been submitted and will be updated in additional encounter.

## 2022-06-06 NOTE — Telephone Encounter (Signed)
PA has been APPROVED from 06/06/2022-04/16/2023

## 2022-06-26 ENCOUNTER — Encounter: Payer: Self-pay | Admitting: Internal Medicine

## 2022-06-26 ENCOUNTER — Ambulatory Visit (INDEPENDENT_AMBULATORY_CARE_PROVIDER_SITE_OTHER): Payer: PPO | Admitting: Internal Medicine

## 2022-06-26 VITALS — BP 111/73 | HR 71 | Temp 97.8°F | Ht 64.0 in | Wt 205.2 lb

## 2022-06-26 DIAGNOSIS — K5900 Constipation, unspecified: Secondary | ICD-10-CM | POA: Diagnosis not present

## 2022-06-26 DIAGNOSIS — K219 Gastro-esophageal reflux disease without esophagitis: Secondary | ICD-10-CM | POA: Diagnosis not present

## 2022-06-26 NOTE — Progress Notes (Signed)
Primary Care Physician:  Celene Squibb, MD Primary Gastroenterologist:  Dr. Gala Romney  Pre-Procedure History & Physical: HPI:  Kendra Orozco is a 77 y.o. female here for follow-up GERD and dysphagia.  History of constipation and hemorrhoids.  Dysphagia improved since her esophagus was dilated.  Reflux well-controlled on Dexilant.  Continues Amitiza, stool softener, fiber supplement with good results daily.  Occasionally has protrusion of hemorrhoids.  Uses topical hydrocortisone from time to time.  Limits the use as instructions a "not to be used internally".  Past Medical History:  Diagnosis Date   Angio-edema    Anxiety    Arthritis    Asthma    Cancer ([place])    skin and colon   GERD (gastroesophageal reflux disease)    Gout    Hypertension    Urticaria     Past Surgical History:  Procedure Laterality Date   ABDOMINAL HYSTERECTOMY     fibroid tumors   APPENDECTOMY     BACK SURGERY     BIOPSY  01/08/2022   Procedure: BIOPSY;  Surgeon: Daneil Dolin, MD;  Location: [facility];  Service: Endoscopy;;   BREAST CYST EXCISION Right    CATARACT EXTRACTION W/PHACO Right 11/20/2019   Procedure: CATARACT EXTRACTION PHACO AND INTRAOCULAR LENS PLACEMENT ([place]) RIGHT EYE;  Surgeon: Baruch Goldmann, MD;  Location: [facility] OR[facility];  Service: Ophthalmology;  Laterality: Right;  CDE: 17.30   CATARACT EXTRACTION W/PHACO Left 12/04/2019   Procedure: CATARACT EXTRACTION PHACO AND INTRAOCULAR LENS PLACEMENT LEFT EYE;  Surgeon: Baruch Goldmann, MD;  Location: [facility] OR[facility];  Service: Ophthalmology;  Laterality: Left;  CDE: 14.59   COLONOSCOPY  09/16/2007   [id number] canal hemorrhoids, otherwise normal rectum left-sided diverticula and colonic mucosa appeared normal.   COLONOSCOPY N/A 03/02/2013   Dr. Chauncy Lean adenoma. Colonic diverticulosis. Anal canal hemorrhoids-likely source of hematochezia. Surveillance 2019   COLONOSCOPY WITH PROPOFOL N/A 07/29/2017   Non-bleeding internal hemorrhoids, cecal AVMs.     COLONOSCOPY WITH PROPOFOL N/A 03/23/2022   Procedure: COLONOSCOPY WITH PROPOFOL;  Surgeon: Daneil Dolin, MD;  Location: [facility];  Service: Endoscopy;  Laterality: N/A;  9:00 am   ESOPHAGOGASTRODUODENOSCOPY  09/16/2007   [id number] undulating Z-line, tiny distal esophageal  erosions consistent with mild erosive reflux esophagitis, patulous EG junction, small hiatal hernia, otherwise normal stomach, D1, and D2.   ESOPHAGOGASTRODUODENOSCOPY (EGD) WITH PROPOFOL N/A 01/08/2022   Procedure: ESOPHAGOGASTRODUODENOSCOPY (EGD) WITH PROPOFOL;  Surgeon: Daneil Dolin, MD;  [facility];  Service: Endoscopy;  Laterality: N/A;  2:30 pm   HEMORRHOID SURGERY     X 2    MALONEY DILATION N/A 01/08/2022   Procedure: MALONEY DILATION;  Surgeon: Daneil Dolin, MD;  [facility];  Service: Endoscopy;  Laterality: N/A;   POLYPECTOMY  03/23/2022   Procedure: POLYPECTOMY;  Surgeon: Daneil Dolin, MD;  [facility];  Service: Endoscopy;;    Prior to Admission medications   Medication Sig Start Date End Date Taking? Authorizing Provider  albuterol (VENTOLIN HFA) 108 (90 Base) MCG/ACT inhaler Inhale 2 puffs into the lungs every 4 (four) hours as needed for wheezing or shortness of breath. 05/25/22  Yes Valentina Shaggy, MD  allopurinol (ZYLOPRIM) 100 MG tablet Take 100 mg by mouth 3 (three) times daily. 09/19/20  Yes [provider]  budesonide-formoterol (SYMBICORT) 160-4.5 MCG/ACT inhaler Inhale 2 puffs into the lungs 2 (two) times daily. 06/01/22  Yes Valentina Shaggy, MD  calcium carbonate (OS-CAL -  DOSED IN MG OF ELEMENTAL CALCIUM) 1250 (500 Ca) MG tablet Chew 1 tablet by mouth 2 (two) times a week.    Yes [provider]  dexlansoprazole (DEXILANT) 60 MG capsule Take 1 capsule (60 mg total) by mouth daily. 02/09/22  Yes Aliene Altes S, PA-C  docusate sodium (COLACE) 250 MG capsule Take 250 mg by mouth 2 (two) times a week.   Yes [provider]  hydrocortisone 2.5 % cream Apply 2.5 Applications topically daily as needed (hemorrhoids). 11/08/21  Yes [provider]  ibuprofen (ADVIL,MOTRIN) 200 MG tablet Take 400 mg by mouth daily as needed for mild pain or moderate pain (Back pain).   Yes [provider]  ipratropium (ATROVENT) 0.06 % nasal spray Place 2 sprays into both nostrils 2 (two) times daily. 05/25/22  Yes Valentina Shaggy, MD  ketoconazole (NIZORAL) 2 % cream Apply 1 Application topically daily as needed for irritation. 07/06/20  Yes [provider]  levocetirizine (XYZAL) 5 MG tablet Take 5 mg by mouth at bedtime.   Yes [provider]  LORazepam (ATIVAN) 1 MG tablet Take 0.5 mg by mouth 2 (two) times daily.   Yes [provider]  lubiprostone (AMITIZA) 24 MCG capsule Take 24 mcg by mouth 2 (two) times daily with a meal.   Yes [provider]  Menthol-Methyl Salicylate (MUSCLE RUB) 10-15 % CREA Apply 1 application  topically daily as needed for muscle pain.   Yes [provider]  methocarbamol (ROBAXIN) 500 MG tablet Take 500 mg by mouth daily as needed for muscle spasms. 07/13/21  Yes [provider]  Multiple Vitamin (MULTIVITAMIN WITH MINERALS) TABS tablet Take 1 tablet by mouth daily.   Yes [provider]  olmesartan (BENICAR) 40 MG tablet Take 40 mg by mouth daily.   Yes [provider]  ondansetron (ZOFRAN) 4 MG tablet Take 1 tablet (4 mg total) by mouth every 6 (six) hours as needed for nausea. 06/07/20  Yes Kathie Dike, MD  Polyvinyl Alcohol-Povidone (REFRESH OP) Place 1 drop into both eyes daily as needed (dry eyes).   Yes [provider]  potassium chloride SA (KLOR-CON M) 20 MEQ tablet Take 10 mEq by mouth daily.   Yes [provider]  Spacer/Aero-Holding Chambers DEVI 1 Device by Does not apply route as directed. 05/25/22  Yes Valentina Shaggy, MD  torsemide (DEMADEX) 20 MG tablet Take 10 mg  by mouth daily. 07/22/20  Yes [provider]  triamcinolone (KENALOG) 0.1 % Apply 1 Application topically daily at 4 PM. 03/23/20  Yes [provider]  Wheat Dextrin (BENEFIBER) POWD Take 1 Package by mouth 2 (two) times daily.   Yes [provider]    Allergies as of 06/26/2022 - Review Complete 06/26/2022  Allergen Reaction Noted   Pantoprazole sodium  07/18/2017   Plastibase  01/04/2022   Latex Rash 02/16/2013   Sulfa antibiotics Swelling 02/11/2013    Family History  Problem Relation Age of Onset   Colon cancer Brother        33, deceased   Breast cancer Sister 63   Breast cancer Maternal Aunt 28   Heart attack Mother    Allergic rhinitis Mother    Allergic rhinitis Father     Social History   Socioeconomic History   Marital status: Divorced    Spouse name: Not on file   Number of children: Not on file   Years of education: Not on file   Highest education  level: Not on file  Occupational History   Not on file  Tobacco Use   Smoking status: Former    Packs/day: 0.25    Years: 3.00    Total pack years: 0.75    Types: Cigarettes   Smokeless tobacco: Never  Vaping Use   Vaping Use: Never used  Substance and Sexual Activity   Alcohol use: Yes    Comment: occ   Drug use: No   Sexual activity: Not Currently    Birth control/protection: Surgical  Other Topics Concern   Not on file  Social History Narrative   Not on file   Social Determinants of Health   Financial Resource Strain: Not on file  Food Insecurity: Not on file  Transportation Needs: Not on file  Physical Activity: Not on file  Stress: Not on file  Social Connections: Not on file  Intimate Partner Violence: Not on file    Review of Systems: See HPI, otherwise negative ROS  Physical Exam: BP 111/73 (BP Location: Right Arm, Patient Position: Sitting, Cuff Size: Large)   Pulse 71   Temp 97.8 F (36.6 C) (Oral)   Ht '5\' 4"'$  (1.626 m)   Wt 205 lb 3.2 oz (93.1 kg)    SpO2 98%   BMI 35.22 kg/m  General:   Alert,  Well-developed, well-nourished, pleasant and cooperative in NAD  Impression/Plan: GERD well-controlled on Dexilant 60 mg daily.  No alarm symptoms.  Chronic constipation managed fairly well with stool softeners, fiber and Amitiza.  Hemorrhoid symptomatic from time to time;  she did have 1 banding previously which was modestly helpful.  Continues to decline further banding.  Patient using hemorrhoid cream sparingly externally.  Recommendations:  Continue Dexilant 60 mg daily best taken 30 minutes to an hour before meals.  Continue your regimen with fiber, Amitiza and stool softeners.  May use hemorrhoid cream externally and up in the anal canal as needed  No further GI evaluation warranted at this time  Plan to see you back in 1 year and as needed.      Notice: This dictation was prepared with Dragon dictation along with smaller phrase technology. Any transcriptional errors that result from this process are unintentional and may not be corrected upon review.

## 2022-06-26 NOTE — Patient Instructions (Signed)
It was good to see you again today!  Sounds like your acid reflux is under good control.  Continue Dexilant 60 mg daily best taken 30 minutes to an hour before meals.  Continue your regimen with fiber, Amitiza and stool softeners.  May use hemorrhoid cream externally and up in the anal canal as needed  No further GI evaluation warranted at this time  Plan to see you back in 1 year and as needed.

## 2022-07-04 DIAGNOSIS — Z85820 Personal history of malignant melanoma of skin: Secondary | ICD-10-CM | POA: Diagnosis not present

## 2022-07-04 DIAGNOSIS — Z1283 Encounter for screening for malignant neoplasm of skin: Secondary | ICD-10-CM | POA: Diagnosis not present

## 2022-07-04 DIAGNOSIS — Z85828 Personal history of other malignant neoplasm of skin: Secondary | ICD-10-CM | POA: Diagnosis not present

## 2022-07-04 DIAGNOSIS — D239 Other benign neoplasm of skin, unspecified: Secondary | ICD-10-CM | POA: Diagnosis not present

## 2022-07-04 DIAGNOSIS — L720 Epidermal cyst: Secondary | ICD-10-CM | POA: Diagnosis not present

## 2022-07-04 DIAGNOSIS — L570 Actinic keratosis: Secondary | ICD-10-CM | POA: Diagnosis not present

## 2022-07-04 DIAGNOSIS — D485 Neoplasm of uncertain behavior of skin: Secondary | ICD-10-CM | POA: Diagnosis not present

## 2022-07-31 DIAGNOSIS — J310 Chronic rhinitis: Secondary | ICD-10-CM | POA: Diagnosis not present

## 2022-07-31 DIAGNOSIS — H903 Sensorineural hearing loss, bilateral: Secondary | ICD-10-CM | POA: Diagnosis not present

## 2022-07-31 DIAGNOSIS — J342 Deviated nasal septum: Secondary | ICD-10-CM | POA: Diagnosis not present

## 2022-07-31 DIAGNOSIS — J343 Hypertrophy of nasal turbinates: Secondary | ICD-10-CM | POA: Diagnosis not present

## 2022-07-31 DIAGNOSIS — H9313 Tinnitus, bilateral: Secondary | ICD-10-CM | POA: Diagnosis not present

## 2022-08-02 ENCOUNTER — Other Ambulatory Visit: Payer: Self-pay | Admitting: Gastroenterology

## 2022-08-02 DIAGNOSIS — K219 Gastro-esophageal reflux disease without esophagitis: Secondary | ICD-10-CM

## 2022-08-16 DIAGNOSIS — D509 Iron deficiency anemia, unspecified: Secondary | ICD-10-CM | POA: Diagnosis not present

## 2022-08-16 DIAGNOSIS — E790 Hyperuricemia without signs of inflammatory arthritis and tophaceous disease: Secondary | ICD-10-CM | POA: Diagnosis not present

## 2022-08-16 DIAGNOSIS — R748 Abnormal levels of other serum enzymes: Secondary | ICD-10-CM | POA: Diagnosis not present

## 2022-08-16 DIAGNOSIS — E669 Obesity, unspecified: Secondary | ICD-10-CM | POA: Diagnosis not present

## 2022-08-16 DIAGNOSIS — R7301 Impaired fasting glucose: Secondary | ICD-10-CM | POA: Diagnosis not present

## 2022-08-22 DIAGNOSIS — E782 Mixed hyperlipidemia: Secondary | ICD-10-CM | POA: Diagnosis not present

## 2022-08-22 DIAGNOSIS — I10 Essential (primary) hypertension: Secondary | ICD-10-CM | POA: Diagnosis not present

## 2022-08-22 DIAGNOSIS — I129 Hypertensive chronic kidney disease with stage 1 through stage 4 chronic kidney disease, or unspecified chronic kidney disease: Secondary | ICD-10-CM | POA: Diagnosis not present

## 2022-08-22 DIAGNOSIS — K219 Gastro-esophageal reflux disease without esophagitis: Secondary | ICD-10-CM | POA: Diagnosis not present

## 2022-08-22 DIAGNOSIS — K644 Residual hemorrhoidal skin tags: Secondary | ICD-10-CM | POA: Diagnosis not present

## 2022-08-22 DIAGNOSIS — G8929 Other chronic pain: Secondary | ICD-10-CM | POA: Diagnosis not present

## 2022-08-22 DIAGNOSIS — Z0001 Encounter for general adult medical examination with abnormal findings: Secondary | ICD-10-CM | POA: Diagnosis not present

## 2022-08-22 DIAGNOSIS — M545 Low back pain, unspecified: Secondary | ICD-10-CM | POA: Diagnosis not present

## 2022-08-22 DIAGNOSIS — F411 Generalized anxiety disorder: Secondary | ICD-10-CM | POA: Diagnosis not present

## 2022-08-22 DIAGNOSIS — N1831 Chronic kidney disease, stage 3a: Secondary | ICD-10-CM | POA: Diagnosis not present

## 2022-08-22 DIAGNOSIS — R7303 Prediabetes: Secondary | ICD-10-CM | POA: Insufficient documentation

## 2022-08-22 DIAGNOSIS — K581 Irritable bowel syndrome with constipation: Secondary | ICD-10-CM | POA: Diagnosis not present

## 2022-08-24 ENCOUNTER — Encounter: Payer: Self-pay | Admitting: Allergy & Immunology

## 2022-08-24 ENCOUNTER — Other Ambulatory Visit: Payer: Self-pay

## 2022-08-24 ENCOUNTER — Ambulatory Visit (INDEPENDENT_AMBULATORY_CARE_PROVIDER_SITE_OTHER): Payer: PPO | Admitting: Allergy & Immunology

## 2022-08-24 VITALS — BP 138/88 | HR 104 | Temp 97.2°F | Resp 18 | Ht 64.0 in | Wt 210.4 lb

## 2022-08-24 DIAGNOSIS — J310 Chronic rhinitis: Secondary | ICD-10-CM

## 2022-08-24 DIAGNOSIS — R0982 Postnasal drip: Secondary | ICD-10-CM

## 2022-08-24 DIAGNOSIS — J4540 Moderate persistent asthma, uncomplicated: Secondary | ICD-10-CM | POA: Diagnosis not present

## 2022-08-24 DIAGNOSIS — K219 Gastro-esophageal reflux disease without esophagitis: Secondary | ICD-10-CM

## 2022-08-24 MED ORDER — BUDESONIDE-FORMOTEROL FUMARATE 160-4.5 MCG/ACT IN AERO: 2.0000 | INHALATION_SPRAY | Freq: Two times a day (BID) | RESPIRATORY_TRACT | 3 refills | Status: DC

## 2022-08-24 MED ORDER — IPRATROPIUM BROMIDE 0.06 % NA SOLN: 2.0000 | Freq: Two times a day (BID) | NASAL | 5 refills | Status: DC

## 2022-08-24 NOTE — Patient Instructions (Addendum)
1. Moderate persistent asthma, uncomplicated - Lung testing excellent today. - Daily controller medication(s): Symbicort two puffs twice daily with spacer - Prior to physical activity: albuterol 2 puffs 10-15 minutes before physical activity. - Rescue medications: albuterol 4 puffs every 4-6 hours as needed - Asthma control goals:  * Full participation in all desired activities (may need albuterol before activity) * Albuterol use two time or less a week on average (not counting use with activity) * Cough interfering with sleep two time or less a month * Oral steroids no more than once a year * No hospitalizations  2. Chronic rhinitis - I am glad that your nose is doing well. - We are not going to make any changes. - Continue with: ipratropium one spray per nostril twice daily as needed like you are doing.  - We will schedule you for skin testing (since your last testing was negative).  - At this point, we would not be able to put anything into your shots.   3. Return in about 4 weeks (around 09/21/2022), or REPEAT ENVIRONMENTAL ALLERGY TESTING AND FOOD TESTING.    Please inform us of any Emergency Department visits, hospitalizations, or changes in symptoms. Call us before going to the ED for breathing or allergy symptoms since we might be able to fit you in for a sick visit. Feel free to contact us anytime with any questions, problems, or concerns.  It was a pleasure to see you again today!  Websites that have reliable patient information: 1. American Academy of Asthma, Allergy, and Immunology: www.aaaai.org 2. Food Allergy Research and Education (FARE): foodallergy.org 3. Mothers of Asthmatics: http://www.asthmacommunitynetwork.org 4. American College of Allergy, Asthma, and Immunology: www.acaai.org   COVID-19 Vaccine Information can be found at: [email or web address].[email or web address] For questions related to vaccine distribution or  appointments, please email [email or web address] or call [phone].   We realize that you might be concerned about having an allergic reaction to the COVID19 vaccines. To help with that concern, WE ARE OFFERING THE COVID19 VACCINES IN OUR OFFICE! Ask the front desk for dates!     "Like" [place] on Facebook and Instagram for our latest updates!      A healthy democracy works best when [employer] participate! Make sure you are registered to vote! If you have moved or changed any of your contact information, you will need to get this updated before voting!  In some cases, you MAY be able to register to vote online: [email or web address]

## 2022-08-24 NOTE — Progress Notes (Signed)
FOLLOW UP  Date of Service/Encounter:  08/24/22   Assessment:   Mild intermittent asthma, uncomplicated   Chronic non-allergic rhinitis - with marked postnasal drip   Food intolerance   Rash - resolved   GERD with multiple gastric polyps - on Dexilant 60 mg     Ms. Kendra Orozco's main complaint today stems from her mucus that seems to get caught in her upper throat.  She has a lot of postnasal drip.  She is on ipratropium as well as fluticasone with varying degrees of success.  She has talked to her PCP, who recommended doing allergy shots.  I explained to her that we did allergy testing back in 2021 and she had no sensitizations, therefore allergy shots would not be indicated.  She prefers to do repeat testing to see if she has developed any sensitizations which could be addressed with allergen immunotherapy.  If this does not work, we might consider trying to get Dupixent on board which might help with the rhinitis as well as her asthma.  She reports some throat closing sensations, but I think this is more related to her underlying anxiety in combination with the mucus and globus sensation.  Plan/Recommendations:    1. Moderate persistent asthma, uncomplicated - Lung testing excellent today. - Daily controller medication(s): Symbicort two puffs twice daily with spacer - Prior to physical activity: albuterol 2 puffs 10-15 minutes before physical activity. - Rescue medications: albuterol 4 puffs every 4-6 hours as needed - Asthma control goals:  * Full participation in all desired activities (may need albuterol before activity) * Albuterol use two time or less a week on average (not counting use with activity) * Cough interfering with sleep two time or less a month * Oral steroids no more than once a year * No hospitalizations  2. Chronic rhinitis - I am glad that your nose is doing well. - We are not going to make any changes. - Continue with: ipratropium one spray per nostril  twice daily as needed like you are doing.  - We will schedule you for skin testing (since your last testing was negative).  - At this point, we would not be able to put anything into your shots.   3. Return in about 4 weeks (around 09/21/2022), or REPEAT ENVIRONMENTAL ALLERGY TESTING AND FOOD TESTING.    Subjective:   Kendra Orozco is a 77 y.o. female presenting today for follow up of  Chief Complaint  Patient presents with   Asthma    3 mth f/u - Rough    [name] has a history of the following: Patient Active Problem List   Diagnosis Date Noted   IDA (iron deficiency anemia) 02/20/2022   Elevated alkaline phosphatase level 02/20/2022   Dysphagia 12/14/2021   Hyponatremia 06/06/2020   COVID-19 virus infection 06/06/2020   HTN (hypertension) 06/06/2020   Prolapsed internal hemorrhoids, grade 3 05/12/2020   Constipation 04/15/2013   Rectal bleeding 02/11/2013   Gastroesophageal reflux disease 02/11/2013    History obtained from: chart review and patient.  [name] is a 77 y.o. female presenting for a follow up visit.  We last saw her in February 2024.  At that time, her lung testing looked great.  We gave her a sample of Breztri since her insurance no longer covers Symbicort.  We continue with albuterol as needed.  For her rhinitis, her nose was well-controlled with ipratropium as needed.   Asthma/Respiratory Symptom History: She has to wear a breathe right  strips to help keep her nostril open.  She unfortunately did not tolerate the Breztri well. She reports that her upper airway spasmed and collapsed on her. She took a nerve pill and took an Ativan and this did relax. She does have some nerve problems and stays tense all of the time. She did have some anxiety and this led to tachycardia as well. She is now back on the Symbicort for eight days. She reports that she was having "blood vessel rupturing" on her arms. She apparently was on prednisone chronically for a number of  years. She was having trouble with   Allergic Rhinitis Symptom History: She is using the Atrovent twice daily. This is working wlel to control her symptoms. She will sometimes use it 2-3 times a day. Most of the time, she gets by without a problem. It does dry her out.   Food Allergy Symptom History: She does have some issues with scents of various foods. She has problems when she goes out to eat.  She has a number of foods that she reports cause her to have reactions. She had testing to the most common foods back when I first met her in 2021 and the testing was all negative. We have not done blood work on her.   GERD Symptom History: She remains on the Dexilant 60mg  daily. She is very compliant with this regimen.   She has been on chronic prednisone in the past from gout.   Otherwise, there have been no changes to her past medical history, surgical history, family history, or social history.    Review of Systems  Constitutional: Negative.  Negative for chills, fever, malaise/fatigue and weight loss.  HENT:  Negative for congestion, ear discharge, ear pain and sinus pain.   Eyes:  Negative for pain, discharge and redness.  Respiratory:  Negative for cough, sputum production, shortness of breath and wheezing.   Cardiovascular:  Negative for chest pain and palpitations.  Gastrointestinal:  Negative for abdominal pain, constipation, diarrhea, heartburn, nausea and vomiting.  Skin: Negative.  Negative for itching and rash.  Neurological:  Negative for dizziness and headaches.  Endo/Heme/Allergies:  Positive for environmental allergies. Does not bruise/bleed easily.  All other systems reviewed and are negative.      Objective:   Blood pressure 138/88, pulse (!) 104, temperature (!) 97.2 F (36.2 C), temperature source Temporal, resp. rate 18, height 5\' 4"  (1.626 m), weight 210 lb 6.4 oz (95.4 kg), SpO2 95 %. Body mass index is 36.12 kg/m.    Physical Exam Vitals reviewed.   Constitutional:      Appearance: She is well-developed.     Comments: Smiling and pleasant. Talkative.   HENT:     Head: Normocephalic and atraumatic.     Right Ear: Tympanic membrane, ear canal and external ear normal.     Left Ear: Tympanic membrane, ear canal and external ear normal.     Nose: Mucosal edema and rhinorrhea present. No nasal deformity or septal deviation.     Right Turbinates: Enlarged, swollen and pale.     Left Turbinates: Enlarged, swollen and pale.     Right Sinus: No maxillary sinus tenderness or frontal sinus tenderness.     Left Sinus: No maxillary sinus tenderness or frontal sinus tenderness.     Comments: No nasal polyps.  Clear rhinorrhea.    Mouth/Throat:     Lips: Pink.     Mouth: Mucous membranes are moist. Mucous membranes are not pale and not dry.  Pharynx: Uvula midline.     Comments: Moderate cobblestoning. Eyes:     General:        Right eye: No discharge.        Left eye: No discharge.     Conjunctiva/sclera: Conjunctivae normal.     Right eye: Right conjunctiva is not injected. No chemosis.    Left eye: Left conjunctiva is not injected. No chemosis.    Pupils: Pupils are equal, round, and reactive to light.  Cardiovascular:     Rate and Rhythm: Normal rate and regular rhythm.     Heart sounds: Normal heart sounds.  Pulmonary:     Effort: Pulmonary effort is normal. No tachypnea, accessory muscle usage or respiratory distress.     Breath sounds: Examination of the right-upper field reveals wheezing. Wheezing present. No decreased breath sounds, rhonchi or rales.     Comments: Moving air well in all lung fields. No increased work of breathing noted.  She does have some expiratory wheezes noted on the right upper lung fields. Chest:     Chest wall: No tenderness.  Lymphadenopathy:     Cervical: No cervical adenopathy.  Skin:    General: Skin is warm.     Capillary Refill: Capillary refill takes less than 2 seconds.     Coloration: Skin  is not pale.     Findings: No abrasion, erythema, petechiae or rash. Rash is not papular, urticarial or vesicular.     Comments: No eczematous or urticarial lesions noted.   Neurological:     Mental Status: She is alert.  Psychiatric:        Behavior: Behavior is cooperative.      Diagnostic studies:    Spirometry: results normal (FEV1: 1.74/87%, FVC: 2.35/89%, FEV1/FVC: 74%).    Spirometry consistent with normal pattern.   Allergy Studies: none       Malachi Bonds, MD  [facility]

## 2022-09-18 DIAGNOSIS — M797 Fibromyalgia: Secondary | ICD-10-CM | POA: Diagnosis not present

## 2022-09-18 DIAGNOSIS — E669 Obesity, unspecified: Secondary | ICD-10-CM | POA: Diagnosis not present

## 2022-09-18 DIAGNOSIS — Z6835 Body mass index (BMI) 35.0-35.9, adult: Secondary | ICD-10-CM | POA: Diagnosis not present

## 2022-09-18 DIAGNOSIS — R5382 Chronic fatigue, unspecified: Secondary | ICD-10-CM | POA: Diagnosis not present

## 2022-09-18 DIAGNOSIS — M1A09X0 Idiopathic chronic gout, multiple sites, without tophus (tophi): Secondary | ICD-10-CM | POA: Diagnosis not present

## 2022-09-21 ENCOUNTER — Encounter: Payer: Self-pay | Admitting: Allergy & Immunology

## 2022-09-21 ENCOUNTER — Ambulatory Visit (INDEPENDENT_AMBULATORY_CARE_PROVIDER_SITE_OTHER): Payer: PPO | Admitting: Allergy & Immunology

## 2022-09-21 VITALS — BP 136/82 | HR 97 | Temp 97.4°F | Resp 16 | Ht 63.5 in | Wt 209.8 lb

## 2022-09-21 DIAGNOSIS — J4540 Moderate persistent asthma, uncomplicated: Secondary | ICD-10-CM | POA: Diagnosis not present

## 2022-09-21 DIAGNOSIS — J302 Other seasonal allergic rhinitis: Secondary | ICD-10-CM

## 2022-09-21 DIAGNOSIS — J3089 Other allergic rhinitis: Secondary | ICD-10-CM

## 2022-09-21 NOTE — Progress Notes (Signed)
FOLLOW UP  Date of Service/Encounter:  09/21/22   Assessment:   Mild intermittent asthma, uncomplicated   Perennial and seasonal allergic rhinitis (grass, molds, dust mites, cats, and dog) - interested in starting allergen immunotherapy   Food intolerance   Rash - resolved   GERD with multiple gastric polyps - on Dexilant 60 mg    Plan/Recommendations:   1. Moderate persistent asthma, uncomplicated - Lung testing not done today. - Daily controller medication(s): Symbicort two puffs twice daily with spacer - Prior to physical activity: albuterol 2 puffs 10-15 minutes before physical activity. - Rescue medications: albuterol 4 puffs every 4-6 hours as needed - Asthma control goals:  * Full participation in all desired activities (may need albuterol before activity) * Albuterol use two time or less a week on average (not counting use with activity) * Cough interfering with sleep two time or less a month * Oral steroids no more than once a year * No hospitalizations  2. Chronic rhinitis - Testing was reactive to grass, molds, dust mites, cats, and dog. - Allergy shot consent signed. - Continue with: ipratropium one spray per nostril twice daily as needed like you are doing.  - You will get some relief once you get to the top dose, which will be 4-6 months if you come regularly.   3. Return in about 3 months (around 12/22/2022). You can have the follow up appointment with Dr. Dellis Anes or a Nurse Practicioner (our Nurse Practitioners are excellent and always have Physician oversight!).   Subjective:   Kendra Orozco is a 77 y.o. female presenting today for follow up of  Chief Complaint  Patient presents with   Allergy Testing    Environmental testing    Kendra Orozco has a history of the following: Patient Active Problem List   Diagnosis Date Noted   IDA (iron deficiency anemia) 02/20/2022   Elevated alkaline phosphatase level 02/20/2022   Dysphagia 12/14/2021    Hyponatremia 06/06/2020   COVID-19 virus infection 06/06/2020   HTN (hypertension) 06/06/2020   Prolapsed internal hemorrhoids, grade 3 05/12/2020   Constipation 04/15/2013   Rectal bleeding 02/11/2013   Gastroesophageal reflux disease 02/11/2013    History obtained from: chart review and patient.  Kendra Orozco is a 77 y.o. female presenting for skin testing. She was last seen in May 2024. At that time, her lnug testing looked great. We continued with the Symbicort two puffs twice daily. For her rhinitis, she was having a lot of mucous production. We continued with ipratropium one spray per nostril twice daily. We recommended that we do repeat allergy testing to see if she ahd developed some new sensitizations.  Since the last visit, she has done very well.  She is here today for skin testing.  She had been on shots for 3 years years ago.  Apparently she gave them to her self at home.  She is not sure whether they work.  She started having large local reactions and stopped giving herself shots.  She is very motivated to do anything to help with her postnasal drip now, which is not very well-controlled at all.  Otherwise, there have been no changes to her past medical history, surgical history, family history, or social history.    Review of Systems  Constitutional: Negative.  Negative for chills, fever, malaise/fatigue and weight loss.  HENT:  Negative for congestion, ear discharge, ear pain and sinus pain.   Eyes:  Negative for pain, discharge and redness.  Respiratory:  Negative for cough, sputum production, shortness of breath and wheezing.   Cardiovascular:  Negative for chest pain and palpitations.  Gastrointestinal:  Negative for abdominal pain, constipation, diarrhea, heartburn, nausea and vomiting.  Skin: Negative.  Negative for itching and rash.  Neurological:  Negative for dizziness and headaches.  Endo/Heme/Allergies:  Positive for environmental allergies. Does not bruise/bleed  easily.  All other systems reviewed and are negative.      Objective:   Blood pressure 136/82, pulse 97, temperature (!) 97.4 F (36.3 C), temperature source Temporal, resp. rate 16, height 5' 3.5" (1.613 m), weight 209 lb 12.8 oz (95.2 kg), SpO2 95 %. Body mass index is 36.58 kg/m.    Physical Exam Vitals reviewed.  Constitutional:      Appearance: She is well-developed.     Comments: Smiling and pleasant. Talkative.   HENT:     Head: Normocephalic and atraumatic.     Right Ear: Tympanic membrane, ear canal and external ear normal.     Left Ear: Tympanic membrane, ear canal and external ear normal.     Nose: Mucosal edema and rhinorrhea present. No nasal deformity or septal deviation.     Right Turbinates: Enlarged, swollen and pale.     Left Turbinates: Enlarged, swollen and pale.     Right Sinus: No maxillary sinus tenderness or frontal sinus tenderness.     Left Sinus: No maxillary sinus tenderness or frontal sinus tenderness.     Comments: No nasal polyps.  Clear rhinorrhea.    Mouth/Throat:     Lips: Pink.     Mouth: Mucous membranes are moist. Mucous membranes are not pale and not dry.     Pharynx: Uvula midline.     Comments: Moderate cobblestoning. Eyes:     General:        Right eye: No discharge.        Left eye: No discharge.     Conjunctiva/sclera: Conjunctivae normal.     Right eye: Right conjunctiva is not injected. No chemosis.    Left eye: Left conjunctiva is not injected. No chemosis.    Pupils: Pupils are equal, round, and reactive to light.  Cardiovascular:     Rate and Rhythm: Normal rate and regular rhythm.     Heart sounds: Normal heart sounds.  Pulmonary:     Effort: Pulmonary effort is normal. No tachypnea, accessory muscle usage or respiratory distress.     Breath sounds: No decreased breath sounds, wheezing, rhonchi or rales.     Comments: Moving air well in all lung fields. No increased work of breathing noted.   Chest:     Chest wall: No  tenderness.  Lymphadenopathy:     Cervical: No cervical adenopathy.  Skin:    General: Skin is warm.     Capillary Refill: Capillary refill takes less than 2 seconds.     Coloration: Skin is not pale.     Findings: No abrasion, erythema, petechiae or rash. Rash is not papular, urticarial or vesicular.     Comments: No eczematous or urticarial lesions noted.   Neurological:     Mental Status: She is alert.  Psychiatric:        Behavior: Behavior is cooperative.      Diagnostic studies:   Allergy Studies:     Airborne Adult Perc - 09/21/22 1047     Time Antigen Placed 1047    Allergen Manufacturer Waynette Buttery    Location Back    Number of Test 55  1. Control-Buffer 50% Glycerol Negative    2. Control-Histamine 2+    3. Bahia Negative    4. [place] Negative    5. Johnson Negative    6. Kentucky Blue Negative    7. Meadow Fescue Negative    8. Perennial Rye Negative    9. Timothy Negative    10. Ragweed Mix Negative    11. Cocklebur Negative    12. Plantain,  English Negative    13. Baccharis Negative    14. Dog Fennel Negative    15. Russian Thistle Negative    16. Lamb's Quarters Negative    17. Sheep Sorrell Negative    18. Rough Pigweed Negative    19. Marsh Elder, Rough Negative    20. Mugwort, Common Negative    21. Box, Elder Negative    22. Cedar, red Negative    23. Sweet Gum Negative    24. Pecan Pollen Negative    25. Pine Mix Negative    26. Walnut, Black Pollen Negative    27. Red Mulberry Negative    28. Ash Mix Negative    29. Birch Mix Negative    30. Beech American Negative    31. Cottonwood, [place] Negative    32. Hickory, White Negative    33. Maple Mix Negative    34. Oak, [place] Mix Negative    35. Sycamore Eastern Negative    36. Alternaria Alternata Negative    37. Cladosporium Herbarum Negative    38. Aspergillus Mix Negative    39. Penicillium Mix Negative    40. Bipolaris Sorokiniana (Helminthosporium) Negative    41. Drechslera  Spicifera (Curvularia) Negative    42. Mucor Plumbeus Negative    43. Fusarium Moniliforme Negative    44. Aureobasidium Pullulans (pullulara) Negative    45. Rhizopus Oryzae Negative    46. Botrytis Cinera Negative    47. Epicoccum Nigrum Negative    48. Phoma Betae Negative    49. Dust Mite Mix Negative    50. Cat Hair 10,000 BAU/ml Negative    51.  Dog Epithelia Negative    52. Mixed Feathers Negative    53. Horse Epithelia Negative    54. Cockroach, German Negative    55. Tobacco Leaf Negative             Intradermal - 09/21/22 1047     Time Antigen Placed 1047    Allergen Manufacturer Waynette Buttery    Location Back    Number of Test 16    Control Negative    Bahia Negative    [place] 1+    Johnson Negative    7 Grass Negative    Ragweed Mix Negative    Weed Mix Negative    Tree Mix Negative    Mold 1 Negative    Mold 2 3+    Mold 3 3+    Mold 4 3+    Mite Mix 2+    Cat 2+    Dog 3+    Cockroach Negative             Allergy testing results were read and interpreted by myself, documented by clinical staff.      Malachi Bonds, MD  [facility]

## 2022-09-21 NOTE — Patient Instructions (Addendum)
1. Moderate persistent asthma, uncomplicated - Lung testing not done today. - Daily controller medication(s): Symbicort two puffs twice daily with spacer - Prior to physical activity: albuterol 2 puffs 10-15 minutes before physical activity. - Rescue medications: albuterol 4 puffs every 4-6 hours as needed - Asthma control goals:  * Full participation in all desired activities (may need albuterol before activity) * Albuterol use two time or less a week on average (not counting use with activity) * Cough interfering with sleep two time or less a month * Oral steroids no more than once a year * No hospitalizations  2. Chronic rhinitis - Testing was reactive to grass, molds, dust mites, cats, and dog. - Allergy shot consent signed. - Continue with: ipratropium one spray per nostril twice daily as needed like you are doing.  - You will get some relief once you get to the top dose, which will be 4-6 months if you come regularly.   3. Return in about 3 months (around 12/22/2022). You can have the follow up appointment with Dr. Dellis Anes or a Nurse Practicioner (our Nurse Practitioners are excellent and always have Physician oversight!).    Please inform us of any Emergency Department visits, hospitalizations, or changes in symptoms. Call us before going to the ED for breathing or allergy symptoms since we might be able to fit you in for a sick visit. Feel free to contact us anytime with any questions, problems, or concerns.  It was a pleasure to see you again today!  Websites that have reliable patient information: 1. American Academy of Asthma, Allergy, and Immunology: www.aaaai.org 2. Food Allergy Research and Education (FARE): foodallergy.org 3. Mothers of Asthmatics: http://www.asthmacommunitynetwork.org 4. American College of Allergy, Asthma, and Immunology: www.acaai.org   COVID-19 Vaccine Information can be found at:  [email or web address] For questions related to vaccine distribution or appointments, please email [email or web address] or call [phone].   We realize that you might be concerned about having an allergic reaction to the COVID19 vaccines. To help with that concern, WE ARE OFFERING THE COVID19 VACCINES IN OUR OFFICE! Ask the front desk for dates!     "Like" [place] on Facebook and Instagram for our latest updates!      A healthy democracy works best when [employer] participate! Make sure you are registered to vote! If you have moved or changed any of your contact information, you will need to get this updated before voting!  In some cases, you MAY be able to register to vote online: [email or web address]      Airborne Adult Perc - 09/21/22 1047     Time Antigen Placed 1047    Allergen Manufacturer Waynette Buttery    Location Back    Number of Test 55             Intradermal - 09/21/22 1047     Time Antigen Placed 1047    Allergen Manufacturer Waynette Buttery    Location Back    Number of Test 16    Control Negative    Bahia Negative    [place] 1+    Johnson Negative    7 Grass Negative    Ragweed Mix Negative    Weed Mix Negative    Tree Mix Negative    Mold 1 Negative    Mold 2 3+    Mold 3 3+    Mold 4 3+    Mite Mix 2+    Cat 2+    Dog 3+    Cockroach Negative  Reducing Pollen Exposure  The American Academy of Allergy, Asthma and Immunology suggests the following steps to reduce your exposure to pollen during allergy seasons.    Do not hang sheets or clothing out to dry; pollen may collect on these items. Do not mow lawns or spend time around freshly cut grass; mowing stirs up pollen. Keep windows closed at night.  Keep car windows closed while driving. Minimize morning activities outdoors, a time when pollen counts are usually at their highest. Stay indoors as much as possible  when pollen counts or humidity is high and on windy days when pollen tends to remain in the air longer. Use air conditioning when possible.  Many air conditioners have filters that trap the pollen spores. Use a HEPA room air filter to remove pollen form the indoor air you breathe.  Control of Mold Allergen   Mold and fungi can grow on a variety of surfaces provided certain temperature and moisture conditions exist.  Outdoor molds grow on plants, decaying vegetation and soil.  The major outdoor mold, Alternaria and Cladosporium, are found in very high numbers during hot and dry conditions.  Generally, a late Summer - Fall peak is seen for common outdoor fungal spores.  Rain will temporarily lower outdoor mold spore count, but counts rise rapidly when the rainy period ends.  The most important indoor molds are Aspergillus and Penicillium.  Dark, humid and poorly ventilated basements are ideal sites for mold growth.  The next most common sites of mold growth are the bathroom and the kitchen.  Outdoor (Seasonal) Mold Control  Positive outdoor molds via skin testing: Bipolaris (Helminthsporium), Drechslera (Curvalaria), and Mucor  Use air conditioning and keep windows closed Avoid exposure to decaying vegetation. Avoid leaf raking. Avoid grain handling. Consider wearing a face mask if working in moldy areas.    Indoor (Perennial) Mold Control   Positive indoor molds via skin testing: Aspergillus, Penicillium, Fusarium, and Aureobasidium (Pullulara)  Maintain humidity below 50%. Clean washable surfaces with 5% bleach solution. Remove sources e.g. contaminated carpets.    Control of Dog or Cat Allergen  Avoidance is the best way to manage a dog or cat allergy. If you have a dog or cat and are allergic to dog or cats, consider removing the dog or cat from the home. If you have a dog or cat but don't want to find it a new home, or if your family wants a pet even though someone in the household  is allergic, here are some strategies that may help keep symptoms at bay:  Keep the pet out of your bedroom and restrict it to only a few rooms. Be advised that keeping the dog or cat in only one room will not limit the allergens to that room. Don't pet, hug or kiss the dog or cat; if you do, wash your hands with soap and water. High-efficiency particulate air (HEPA) cleaners run continuously in a bedroom or living room can reduce allergen levels over time. Regular use of a high-efficiency vacuum cleaner or a central vacuum can reduce allergen levels. Giving your dog or cat a bath at least once a week can reduce airborne allergen.  Control of Dust Mite Allergen    Dust mites play a major role in allergic asthma and rhinitis.  They occur in environments with high humidity wherever human skin is found.  Dust mites absorb humidity from the atmosphere (ie, they do not drink) and feed on organic matter (including shed human and animal  skin).  Dust mites are a microscopic type of insect that you cannot see with the naked eye.  High levels of dust mites have been detected from mattresses, pillows, carpets, upholstered furniture, bed covers, clothes, soft toys and any woven material.  The principal allergen of the dust mite is found in its feces.  A gram of dust may contain 1,000 mites and 250,000 fecal particles.  Mite antigen is easily measured in the air during house cleaning activities.  Dust mites do not bite and do not cause harm to humans, other than by triggering allergies/asthma.    Ways to decrease your exposure to dust mites in your home:  Encase mattresses, box springs and pillows with a mite-impermeable barrier or cover   Wash sheets, blankets and drapes weekly in hot water (130 F) with detergent and dry them in a dryer on the hot setting.  Have the room cleaned frequently with a vacuum cleaner and a damp dust-mop.  For carpeting or rugs, vacuuming with a vacuum cleaner equipped with a  high-efficiency particulate air (HEPA) filter.  The dust mite allergic individual should not be in a room which is being cleaned and should wait 1 hour after cleaning before going into the room. Do not sleep on upholstered furniture (eg, couches).   If possible removing carpeting, upholstered furniture and drapery from the home is ideal.  Horizontal blinds should be eliminated in the rooms where the person spends the most time (bedroom, study, television room).  Washable vinyl, roller-type shades are optimal. Remove all non-washable stuffed toys from the bedroom.  Wash stuffed toys weekly like sheets and blankets above.   Reduce indoor humidity to less than 50%.  Inexpensive humidity monitors can be purchased at most hardware stores.  Do not use a humidifier as can make the problem worse and are not recommended.   Airborne Adult Perc - 09/21/22 1047     Time Antigen Placed 1047    Allergen Manufacturer Waynette Buttery    Location Back    Number of Test 55             Intradermal - 09/21/22 1047     Time Antigen Placed 1047    Allergen Manufacturer Waynette Buttery    Location Back    Number of Test 16    Control Negative    Bahia Negative    [place] 1+    Johnson Negative    7 Grass Negative    Ragweed Mix Negative    Weed Mix Negative    Tree Mix Negative    Mold 1 Negative    Mold 2 3+    Mold 3 3+    Mold 4 3+    Mite Mix 2+    Cat 2+    Dog 3+    Cockroach Negative

## 2022-09-24 MED ORDER — EPINEPHRINE 0.3 MG/0.3ML IJ SOAJ: 0.3000 mg | INTRAMUSCULAR | 1 refills | Status: AC | PRN

## 2022-09-24 NOTE — Progress Notes (Signed)
VIALS EXP 09-24-23

## 2022-09-24 NOTE — Addendum Note (Signed)
Addended by: Elsworth Soho on: 09/24/2022 11:37 AM   Modules accepted: Orders

## 2022-09-24 NOTE — Progress Notes (Signed)
Aeroallergen Immunotherapy   Ordering Provider: Dr. Malachi Bonds   Patient Details  Name: Kendra Orozco  MRN: [id number]  Date of Birth: 1945-04-22   Order 2 of 2   Vial Label: G/DM/C/D   0.4 ml (Volume)  BAU Concentration -- [place] 10,000  0.7 ml (Volume)  1:10 Concentration -- Cat Hair  0.7 ml (Volume)  1:10 Concentration -- Dog Epithelia  0.7 ml (Volume)   AU Concentration -- Mite Mix (DF 5,000 & DP 5,000)    2.5  ml Extract Subtotal  2.5  ml Diluent  5.0  ml Maintenance Total   Schedule:  B   Blue Vial (1:100,000): Schedule B (6 doses)  Yellow Vial (1:10,000): Schedule B (6 doses)  Green Vial (1:1,000): Schedule B (6 doses)  Red Vial (1:100): Schedule A (14 doses)   Special Instructions: After completion of the first Red Vial, please space to every two weeks. After completion of the second Red Vial, please space to every 4 weeks. Ok to up dose new vials at 0.72mL --> 0.3 mL --> 0.5 mL. Ok to come twice weekly, if desired, as long as there is 48 hours between injections.;

## 2022-09-24 NOTE — Progress Notes (Signed)
NOT MADE UNTIL APPT IS SCHED. 

## 2022-09-24 NOTE — Progress Notes (Signed)
Aeroallergen Immunotherapy  Ordering Provider: Dr. Malachi Bonds  Patient Details Name: MOANA MUNFORD MRN: [id number] Date of Birth: 12-17-1945  Order 1 of 2  Vial Label: Molds  0.2 ml (Volume)  1:10 Concentration -- Aspergillus mix 0.2 ml (Volume)  1:10 Concentration -- Penicillium mix 0.2 ml (Volume)  1:20 Concentration -- Bipolaris sorokiniana 0.2 ml (Volume)  1:20 Concentration -- Drechslera spicifera 0.2 ml (Volume)  1:10 Concentration -- Mucor plumbeus 0.2 ml (Volume)  1:10 Concentration -- Fusarium moniliforme 0.2 ml (Volume)  1:40 Concentration -- Aureobasidium pullulans 0.2 ml (Volume)  1:10 Concentration -- Rhizopus oryzae   1.6  ml Extract Subtotal 3.4  ml Diluent 5.0  ml Maintenance Total  Schedule:  B Silver Vial (1:1,000,000): Blue Vial (1:100,000): Schedule B (6 doses) Yellow Vial (1:10,000): Schedule B (6 doses) Green Vial (1:1,000): Schedule B (6 doses) Red Vial (1:100): Schedule A (14 doses)  Special Instructions: After completion of the first Red Vial, please space to every two weeks. After completion of the second Red Vial, please space to every 4 weeks. Ok to up dose new vials at 0.49mL --> 0.3 mL --> 0.5 mL. Ok to come twice weekly, if desired, as long as there is 48 hours between injections.

## 2022-09-25 DIAGNOSIS — J302 Other seasonal allergic rhinitis: Secondary | ICD-10-CM | POA: Diagnosis not present

## 2022-09-25 DIAGNOSIS — J3081 Allergic rhinitis due to animal (cat) (dog) hair and dander: Secondary | ICD-10-CM | POA: Diagnosis not present

## 2022-10-15 ENCOUNTER — Ambulatory Visit (INDEPENDENT_AMBULATORY_CARE_PROVIDER_SITE_OTHER): Payer: PPO

## 2022-10-15 DIAGNOSIS — J309 Allergic rhinitis, unspecified: Secondary | ICD-10-CM | POA: Diagnosis not present

## 2022-10-15 NOTE — Progress Notes (Signed)
Immunotherapy   Patient Details  Name: Kendra Orozco MRN: [id number] Date of Birth: 1945/07/09  10/15/2022  Kendra Orozco started injections for  molds, grasses, dust mites, cats, and dogs.  Following schedule: B  Frequency:2 times per week Epi-Pen:Epi-Pen Available  Consent signed previously and patient instructions given. Patient sat in the lobby for thirty minutes without an issue.    Ralene Muskrat 10/15/2022, 2:16 PM

## 2022-10-17 ENCOUNTER — Ambulatory Visit (INDEPENDENT_AMBULATORY_CARE_PROVIDER_SITE_OTHER): Payer: PPO

## 2022-10-17 DIAGNOSIS — J309 Allergic rhinitis, unspecified: Secondary | ICD-10-CM

## 2022-10-24 ENCOUNTER — Ambulatory Visit (INDEPENDENT_AMBULATORY_CARE_PROVIDER_SITE_OTHER): Payer: PPO

## 2022-10-24 DIAGNOSIS — J309 Allergic rhinitis, unspecified: Secondary | ICD-10-CM | POA: Diagnosis not present

## 2022-10-26 ENCOUNTER — Ambulatory Visit (INDEPENDENT_AMBULATORY_CARE_PROVIDER_SITE_OTHER): Payer: PPO

## 2022-10-26 DIAGNOSIS — J309 Allergic rhinitis, unspecified: Secondary | ICD-10-CM | POA: Diagnosis not present

## 2022-10-31 ENCOUNTER — Ambulatory Visit (INDEPENDENT_AMBULATORY_CARE_PROVIDER_SITE_OTHER): Payer: PPO

## 2022-10-31 DIAGNOSIS — J309 Allergic rhinitis, unspecified: Secondary | ICD-10-CM

## 2022-11-02 ENCOUNTER — Ambulatory Visit (INDEPENDENT_AMBULATORY_CARE_PROVIDER_SITE_OTHER): Payer: PPO

## 2022-11-02 DIAGNOSIS — J309 Allergic rhinitis, unspecified: Secondary | ICD-10-CM

## 2022-11-07 ENCOUNTER — Ambulatory Visit (INDEPENDENT_AMBULATORY_CARE_PROVIDER_SITE_OTHER): Payer: PPO

## 2022-11-07 DIAGNOSIS — J309 Allergic rhinitis, unspecified: Secondary | ICD-10-CM

## 2022-11-09 ENCOUNTER — Ambulatory Visit (INDEPENDENT_AMBULATORY_CARE_PROVIDER_SITE_OTHER): Payer: PPO

## 2022-11-09 DIAGNOSIS — J309 Allergic rhinitis, unspecified: Secondary | ICD-10-CM

## 2022-11-14 ENCOUNTER — Ambulatory Visit (INDEPENDENT_AMBULATORY_CARE_PROVIDER_SITE_OTHER): Payer: PPO

## 2022-11-14 DIAGNOSIS — J309 Allergic rhinitis, unspecified: Secondary | ICD-10-CM

## 2022-11-16 ENCOUNTER — Ambulatory Visit (INDEPENDENT_AMBULATORY_CARE_PROVIDER_SITE_OTHER): Payer: PPO

## 2022-11-16 DIAGNOSIS — J309 Allergic rhinitis, unspecified: Secondary | ICD-10-CM

## 2022-11-28 ENCOUNTER — Ambulatory Visit (INDEPENDENT_AMBULATORY_CARE_PROVIDER_SITE_OTHER): Payer: PPO

## 2022-11-28 DIAGNOSIS — J309 Allergic rhinitis, unspecified: Secondary | ICD-10-CM | POA: Diagnosis not present

## 2022-11-30 ENCOUNTER — Ambulatory Visit (INDEPENDENT_AMBULATORY_CARE_PROVIDER_SITE_OTHER): Payer: PPO

## 2022-11-30 DIAGNOSIS — J309 Allergic rhinitis, unspecified: Secondary | ICD-10-CM

## 2022-12-05 ENCOUNTER — Ambulatory Visit: Payer: Self-pay

## 2022-12-05 DIAGNOSIS — J309 Allergic rhinitis, unspecified: Secondary | ICD-10-CM | POA: Diagnosis not present

## 2022-12-06 ENCOUNTER — Telehealth: Payer: Self-pay | Admitting: Allergy & Immunology

## 2022-12-06 ENCOUNTER — Other Ambulatory Visit: Payer: Self-pay | Admitting: Gastroenterology

## 2022-12-06 DIAGNOSIS — K219 Gastro-esophageal reflux disease without esophagitis: Secondary | ICD-10-CM

## 2022-12-06 NOTE — Telephone Encounter (Signed)
Patient's pharmacy called requesting to have a diagnoses code faxed for her spacer. Pharmacy's fax number is [phone].

## 2022-12-06 NOTE — Telephone Encounter (Signed)
Called and spoke to pharmacy and gave them the code J45.40 that is in the patient's last AVS for her Asthma.

## 2022-12-07 ENCOUNTER — Ambulatory Visit (INDEPENDENT_AMBULATORY_CARE_PROVIDER_SITE_OTHER): Payer: PPO

## 2022-12-07 DIAGNOSIS — J309 Allergic rhinitis, unspecified: Secondary | ICD-10-CM | POA: Diagnosis not present

## 2022-12-12 ENCOUNTER — Ambulatory Visit (INDEPENDENT_AMBULATORY_CARE_PROVIDER_SITE_OTHER): Payer: PPO

## 2022-12-12 DIAGNOSIS — J309 Allergic rhinitis, unspecified: Secondary | ICD-10-CM | POA: Diagnosis not present

## 2022-12-14 ENCOUNTER — Ambulatory Visit (INDEPENDENT_AMBULATORY_CARE_PROVIDER_SITE_OTHER): Payer: PPO

## 2022-12-14 DIAGNOSIS — J309 Allergic rhinitis, unspecified: Secondary | ICD-10-CM

## 2022-12-19 ENCOUNTER — Ambulatory Visit (INDEPENDENT_AMBULATORY_CARE_PROVIDER_SITE_OTHER): Payer: PPO

## 2022-12-19 DIAGNOSIS — J309 Allergic rhinitis, unspecified: Secondary | ICD-10-CM | POA: Diagnosis not present

## 2022-12-21 ENCOUNTER — Ambulatory Visit: Payer: Self-pay

## 2022-12-21 ENCOUNTER — Ambulatory Visit: Payer: PPO | Admitting: Allergy & Immunology

## 2022-12-21 ENCOUNTER — Other Ambulatory Visit: Payer: Self-pay

## 2022-12-21 ENCOUNTER — Encounter: Payer: Self-pay | Admitting: Allergy & Immunology

## 2022-12-21 VITALS — BP 120/70 | HR 87 | Resp 17

## 2022-12-21 DIAGNOSIS — K219 Gastro-esophageal reflux disease without esophagitis: Secondary | ICD-10-CM | POA: Diagnosis not present

## 2022-12-21 DIAGNOSIS — J4540 Moderate persistent asthma, uncomplicated: Secondary | ICD-10-CM

## 2022-12-21 DIAGNOSIS — J309 Allergic rhinitis, unspecified: Secondary | ICD-10-CM

## 2022-12-21 DIAGNOSIS — J302 Other seasonal allergic rhinitis: Secondary | ICD-10-CM | POA: Diagnosis not present

## 2022-12-21 DIAGNOSIS — J3089 Other allergic rhinitis: Secondary | ICD-10-CM | POA: Diagnosis not present

## 2022-12-21 NOTE — Patient Instructions (Addendum)
1. Moderate persistent asthma, uncomplicated - Lung testing not done today. - It seems that you are controlling things very effectively with the Symbicort.  - Daily controller medication(s): Symbicort up to two puffs twice daily with spacer (OK to decrease dosing as you are doing to limit side effects) - Prior to physical activity: albuterol 2 puffs 10-15 minutes before physical activity. - Rescue medications: albuterol 4 puffs every 4-6 hours as needed - Asthma control goals:  * Full participation in all desired activities (may need albuterol before activity) * Albuterol use two time or less a week on average (not counting use with activity) * Cough interfering with sleep two time or less a month * Oral steroids no more than once a year * No hospitalizations  2. Chronic rhinitis - Previous testing was reactive to grass, molds, dust mites, cats, and dog. - You are so close to reaching your top dose of the shots!  - Continue with: ipratropium one spray per nostril twice daily as needed like you are doing  3. Return in about 6 months (around 06/20/2023).    Please inform us of any Emergency Department visits, hospitalizations, or changes in symptoms. Call us before going to the ED for breathing or allergy symptoms since we might be able to fit you in for a sick visit. Feel free to contact us anytime with any questions, problems, or concerns.  It was a pleasure to see you again today!  Websites that have reliable patient information: 1. American Academy of Asthma, Allergy, and Immunology: www.aaaai.org 2. Food Allergy Research and Education (FARE): foodallergy.org 3. Mothers of Asthmatics: http://www.asthmacommunitynetwork.org 4. American College of Allergy, Asthma, and Immunology: www.acaai.org   COVID-19 Vaccine Information can be found at: [email or web address] For questions related to vaccine distribution or appointments, please  email [email or web address] or call [phone].   We realize that you might be concerned about having an allergic reaction to the COVID19 vaccines. To help with that concern, WE ARE OFFERING THE COVID19 VACCINES IN OUR OFFICE! Ask the front desk for dates!     "Like" [place] on Facebook and Instagram for our latest updates!      A healthy democracy works best when [employer] participate! Make sure you are registered to vote! If you have moved or changed any of your contact information, you will need to get this updated before voting!  In some cases, you MAY be able to register to vote online: [email or web address]

## 2022-12-21 NOTE — Progress Notes (Signed)
FOLLOW UP  Date of Service/Encounter:  12/21/22   Assessment:   Mild intermittent asthma, uncomplicated   Perennial and seasonal allergic rhinitis (grass, molds, dust mites, cats, and dog) - interested in starting allergen immunotherapy   Food intolerance   Rash - resolved   GERD with multiple gastric polyps - on Dexilant 60 mg    Plan/Recommendations:   1. Moderate persistent asthma, uncomplicated - Lung testing not done today. - It seems that you are controlling things very effectively with the Symbicort.  - Daily controller medication(s): Symbicort up to two puffs twice daily with spacer (OK to decrease dosing as you are doing to limit side effects) - Prior to physical activity: albuterol 2 puffs 10-15 minutes before physical activity. - Rescue medications: albuterol 4 puffs every 4-6 hours as needed - Asthma control goals:  * Full participation in all desired activities (may need albuterol before activity) * Albuterol use two time or less a week on average (not counting use with activity) * Cough interfering with sleep two time or less a month * Oral steroids no more than once a year * No hospitalizations  2. Chronic rhinitis - Previous testing was reactive to grass, molds, dust mites, cats, and dog. - You are so close to reaching your top dose of the shots!  - Continue with: ipratropium one spray per nostril twice daily as needed like you are doing  3. Return in about 6 months (around 06/20/2023).    Subjective:   Kendra Orozco is a 77 y.o. female presenting today for follow up of  Chief Complaint  Patient presents with   Asthma   Allergic Rhinitis     Kendra Orozco has a history of the following: Patient Active Problem List   Diagnosis Date Noted   IDA (iron deficiency anemia) 02/20/2022   Elevated alkaline phosphatase level 02/20/2022   Dysphagia 12/14/2021   Hyponatremia 06/06/2020   COVID-19 virus infection 06/06/2020   HTN (hypertension)  06/06/2020   Prolapsed internal hemorrhoids, grade 3 05/12/2020   Constipation 04/15/2013   Rectal bleeding 02/11/2013   Gastroesophageal reflux disease 02/11/2013    History obtained from: chart review and patient.  Kendra Orozco is a 77 y.o. female presenting for a follow up visit.  She was last seen in June 2024.  At that time, we did not do lung testing.  We continue with Symbicort 2 puffs twice daily with a spacer.  For her rhinitis, she had testing that was positive to grasses, molds, dust mites, cat, and dog.  We continued with ipratropium 1 spray per nostril twice daily.  She did decide to start allergen immunotherapy.  Since the last visit, she has not done well.   Asthma/Respiratory Symptom History: She does her Symbicort two puffs BID when she has flares. Otherwise she tends to decrease the dosing.  She was consonantly on the Symbicort but she is trying to go a few days without it. She feels that the Symbicort is causing some skin thinning, so she is sure that this is the steroid use that makes her skin so thin and friable. She has a lot of bleeding as well. The Symbicort every few days calms things down to a point where she can function. She does not like the sore throat which happens when she uses it 3 times in a row. She is using a spacing.   Allergic Rhinitis Symptom History: She is having the postnasal drip. Weather changed and it worsened. This worsened on Thursday  morning. She woke up with some fluid in her sinuses and ears. She has not had a fever. There was a COVID exposure, but this was 7 days into the course and after she completed the Paxlovid. She did have her mask and gloves on. She was there for five minutes.  She has the Atrovent that works very well.   Skin Symptom History: She has some issues  in combination with the sun damage and the steroid exposure. She has only uses this sparingly. She has used it around twice in two months. She was using the cream which helped.    Otherwise, there have been no changes to her past medical history, surgical history, family history, or social history.    Review of systems otherwise negative other than that mentioned in the HPI.    Objective:   Blood pressure 120/70, pulse 87, resp. rate 17, SpO2 98%. There is no height or weight on file to calculate BMI.    Physical Exam Vitals reviewed.  Constitutional:      Appearance: She is well-developed.     Comments: Smiling and pleasant. Talkative.  HENT:     Head: Normocephalic and atraumatic.     Right Ear: Tympanic membrane, ear canal and external ear normal.     Left Ear: Tympanic membrane, ear canal and external ear normal.     Nose: Mucosal edema and rhinorrhea present. No nasal deformity or septal deviation.     Right Turbinates: Not enlarged, swollen or pale.     Left Turbinates: Not enlarged, swollen or pale.     Right Sinus: No maxillary sinus tenderness or frontal sinus tenderness.     Left Sinus: No maxillary sinus tenderness or frontal sinus tenderness.     Comments: No nasal polyps.  Clear rhinorrhea.    Mouth/Throat:     Lips: Pink.     Mouth: Mucous membranes are moist. Mucous membranes are not pale and not dry.     Pharynx: Uvula midline.     Comments: Moderate cobblestoning. Eyes:     General:        Right eye: No discharge.        Left eye: No discharge.     Conjunctiva/sclera: Conjunctivae normal.     Right eye: Right conjunctiva is not injected. No chemosis.    Left eye: Left conjunctiva is not injected. No chemosis.    Pupils: Pupils are equal, round, and reactive to light.  Cardiovascular:     Rate and Rhythm: Normal rate and regular rhythm.     Heart sounds: Normal heart sounds.  Pulmonary:     Effort: Pulmonary effort is normal. No tachypnea, accessory muscle usage or respiratory distress.     Breath sounds: No decreased breath sounds, wheezing, rhonchi or rales.     Comments: Moving air well in all lung fields. No increased  work of breathing noted.   Chest:     Chest wall: No tenderness.  Lymphadenopathy:     Cervical: No cervical adenopathy.  Skin:    General: Skin is warm.     Capillary Refill: Capillary refill takes less than 2 seconds.     Coloration: Skin is not pale.     Findings: No abrasion, erythema, petechiae or rash. Rash is not papular, urticarial or vesicular.     Comments: No eczematous or urticarial lesions noted.   Neurological:     Mental Status: She is alert.  Psychiatric:        Behavior: Behavior is  cooperative.      Diagnostic studies: none       Malachi Bonds, MD  [facility]

## 2022-12-26 ENCOUNTER — Ambulatory Visit (INDEPENDENT_AMBULATORY_CARE_PROVIDER_SITE_OTHER): Payer: PPO

## 2022-12-26 DIAGNOSIS — J309 Allergic rhinitis, unspecified: Secondary | ICD-10-CM | POA: Diagnosis not present

## 2023-01-04 ENCOUNTER — Ambulatory Visit (INDEPENDENT_AMBULATORY_CARE_PROVIDER_SITE_OTHER): Payer: Self-pay

## 2023-01-04 DIAGNOSIS — J309 Allergic rhinitis, unspecified: Secondary | ICD-10-CM

## 2023-01-09 ENCOUNTER — Ambulatory Visit (INDEPENDENT_AMBULATORY_CARE_PROVIDER_SITE_OTHER): Payer: Self-pay

## 2023-01-09 DIAGNOSIS — J309 Allergic rhinitis, unspecified: Secondary | ICD-10-CM | POA: Diagnosis not present

## 2023-01-23 ENCOUNTER — Telehealth: Payer: Self-pay | Admitting: Allergy & Immunology

## 2023-01-23 NOTE — Telephone Encounter (Addendum)
Patient called stating she came down with Covid on Friday October 4th and was wondering when she could come in for her allergy shot. I advised the patient to retest herself today and if she tests negative to come in for her shot on Friday. Patient states she is fever free but still has a cough and some congestion. Patient wants to know what she can do in the meantime for her cough and congestion. Patient would like a call back at [phone].

## 2023-01-23 NOTE — Telephone Encounter (Signed)
Dr. Dellis Anes, what do you recommend for her cough and congestion?    Patient called stating she came down with Covid on Friday October 4th and was wondering when she could come in for her allergy shot. I advised the patient to retest herself today and if she tests negative to come in for her shot on Friday. Patient states she is fever free but still has a cough and some congestion. Patient wants to know what she can do in the meantime for her cough and congestion. Patient would like a call back at [phone].

## 2023-01-24 NOTE — Telephone Encounter (Signed)
I would be sure that she is using her albuterol four puffs every 4-6 hours during the day. Also definitely try some Delsum over the counter. We can send in Tessalon pearls.   She might need some prednisone.   Malachi Bonds, MD [facility]

## 2023-01-24 NOTE — Telephone Encounter (Signed)
Pt will try this and give [place] an update in a few days

## 2023-01-24 NOTE — Telephone Encounter (Signed)
An you send in the tessalon pearls for pt please to [employer]

## 2023-01-25 MED ORDER — BENZONATATE 100 MG PO CAPS: 100.0000 mg | ORAL_CAPSULE | Freq: Three times a day (TID) | ORAL | 1 refills | Status: DC | PRN

## 2023-01-25 NOTE — Telephone Encounter (Addendum)
I called patient - DOB/DPR verified - LMOVM regarding provider notation.

## 2023-01-25 NOTE — Addendum Note (Signed)
Addended by: Alfonse Spruce on: 01/25/2023 04:35 PM   Modules accepted: Orders

## 2023-01-25 NOTE — Telephone Encounter (Signed)
I sent in Tessalon pearls. CAn someone call and let her know?

## 2023-01-29 ENCOUNTER — Encounter (INDEPENDENT_AMBULATORY_CARE_PROVIDER_SITE_OTHER): Payer: Self-pay

## 2023-01-29 ENCOUNTER — Ambulatory Visit (INDEPENDENT_AMBULATORY_CARE_PROVIDER_SITE_OTHER): Payer: PPO

## 2023-01-29 VITALS — Ht 63.0 in | Wt 205.0 lb

## 2023-01-29 DIAGNOSIS — J310 Chronic rhinitis: Secondary | ICD-10-CM

## 2023-01-29 DIAGNOSIS — H9313 Tinnitus, bilateral: Secondary | ICD-10-CM | POA: Diagnosis not present

## 2023-01-29 DIAGNOSIS — J342 Deviated nasal septum: Secondary | ICD-10-CM

## 2023-01-29 DIAGNOSIS — J343 Hypertrophy of nasal turbinates: Secondary | ICD-10-CM | POA: Diagnosis not present

## 2023-01-30 ENCOUNTER — Ambulatory Visit (INDEPENDENT_AMBULATORY_CARE_PROVIDER_SITE_OTHER): Payer: PPO

## 2023-01-30 ENCOUNTER — Encounter: Payer: Self-pay | Admitting: *Deleted

## 2023-01-30 DIAGNOSIS — J309 Allergic rhinitis, unspecified: Secondary | ICD-10-CM | POA: Diagnosis not present

## 2023-01-30 DIAGNOSIS — H9313 Tinnitus, bilateral: Secondary | ICD-10-CM | POA: Insufficient documentation

## 2023-01-30 DIAGNOSIS — J343 Hypertrophy of nasal turbinates: Secondary | ICD-10-CM | POA: Insufficient documentation

## 2023-01-30 DIAGNOSIS — J310 Chronic rhinitis: Secondary | ICD-10-CM | POA: Insufficient documentation

## 2023-01-30 DIAGNOSIS — J342 Deviated nasal septum: Secondary | ICD-10-CM | POA: Insufficient documentation

## 2023-01-30 NOTE — Progress Notes (Signed)
Patient ID: Kendra Orozco, female   DOB: [birth year], 77 y.o.   MRN: [id number]  Follow-up: Chronic nasal congestion, hearing loss, tinnitus  HPI: The patient is a 77 year old female who returns today for her follow-up evaluation. The patient was previously seen for chronic nasal congestion, hearing loss, and tinnitus.  At her last visit 6 months ago, she was noted to have nasal septal deviation and bilateral inferior turbinate hypertrophy.  She was also noted to have bilateral high-frequency sensorineural hearing loss.  The patient was treated with Flonase, Atrovent, Xyzal, and hearing amplification.  The strategies to cope with tinnitus were also discussed.  The patient returns today complaining of intermittent nasal congestion.  She started immunotherapy with Dr. Dellis Anes.  Her nasal congestion has improved after she was started on the immunotherapy.  The patient also complains of persistent bilateral tinnitus.  The tinnitus is constant and nonpulsatile.  She denies any otalgia, otorrhea, change in her hearing, or facial pain.  Exam: General: Communicates without difficulty, well nourished, no acute distress. Head: Normocephalic, no evidence injury, no tenderness, facial buttresses intact without stepoff. Face/sinus: No tenderness to palpation and percussion. Facial movement is normal and symmetric. Eyes: PERRL, EOMI. No scleral icterus, conjunctivae clear. Neuro: CN II exam reveals vision grossly intact.  No nystagmus at any point of gaze. Ears: Auricles well formed without lesions.  Ear canals are intact without mass or lesion.  No erythema or edema is appreciated.  The TMs are intact without fluid. Nose: External evaluation reveals normal support and skin without lesions.  Dorsum is intact.  Anterior rhinoscopy reveals congested mucosa over anterior aspect of inferior turbinates and intact septum.  No purulence noted. Oral:  Oral cavity and oropharynx are intact, symmetric, without erythema or edema.   Mucosa is moist without lesions. Neck: Full range of motion without pain.  There is no significant lymphadenopathy.  No masses palpable.  Thyroid bed within normal limits to palpation.  Parotid glands and submandibular glands equal bilaterally without mass.  Trachea is midline. Neuro:  CN 2-12 grossly intact.    Assessment: 1.  Chronic rhinitis with nasal mucosal congestion, nasal septal deviation, and bilateral inferior turbinate hypertrophy.  2.  Subjectively stable bilateral high-frequency sensorineural hearing loss. 3.  The patient's tinnitus is likely a direct result of the hearing loss.  Plan: 1.  The physical exam findings are reviewed with the patient. 2.  Continue with Flonase nasal spray, immunotherapy, nasal saline irrigation. 3.  Continue the use of her hearing aids. 4.  The strategies to cope with tinnitus, including the use of masker, hearing aids, tinnitus retraining therapy, and avoidance of caffeine and alcohol are discussed.  5.  The patient will return for reevaluation in 6 months.

## 2023-02-04 ENCOUNTER — Telehealth: Payer: Self-pay | Admitting: Gastroenterology

## 2023-02-04 NOTE — Telephone Encounter (Signed)
Patient has some questions about her medications and needed to know if she needed an appointment to come in .Marland Kitchen Please call to advise.

## 2023-02-05 ENCOUNTER — Other Ambulatory Visit: Payer: Self-pay

## 2023-02-05 NOTE — Telephone Encounter (Signed)
Called pt, line rang numerous times, no answer and no VM

## 2023-02-06 ENCOUNTER — Ambulatory Visit (INDEPENDENT_AMBULATORY_CARE_PROVIDER_SITE_OTHER): Payer: Self-pay

## 2023-02-06 DIAGNOSIS — J309 Allergic rhinitis, unspecified: Secondary | ICD-10-CM

## 2023-02-06 NOTE — Telephone Encounter (Signed)
Pt called stating that her Amitiza will no longer be available to her through patient assistance and is wanting to see about going on something that may be cheaper through her insurance. Pt has only tried [place]. Pt's insurance will cover lactulose and linzess. Pt will call back when she is closer to running out of the Amitiza to try something else. Pt is not due for an appt until 06/2023.

## 2023-02-08 ENCOUNTER — Other Ambulatory Visit: Payer: Self-pay | Admitting: Allergy & Immunology

## 2023-02-13 NOTE — Progress Notes (Signed)
02/13/2023  Patient ID: Kendra Orozco, female   DOB: 08/02/1945, 77 y.o.   MRN: [id number]   2025 Medication Assistance Renewal Application Summary:  Patient was outreached regarding medication assistance renewal for 2025. Verified address, anticipated insurance for 2025, and income has not changed. Patient remains interested in PAP for 2025 for but Amitiza was discontinued, please see below for other medications identified for medication assistance.    Medication Review Findings:  Symbicort cost patient $200   Medication Assistance Findings:  Medication assistance needs identified: Alternative therapy    Plan: I will contact provider with inquiry of alternative affordable therayp. Pharmacy technician will assist with obtaining all required documents from both patient and provider(s) and submit application(s) once completed.    Thank you for allowing pharmacy to be a part of this patient's care.  Cephus Shelling, PharmD Clinical Pharmacist [facility] Cell: [phone]

## 2023-02-15 ENCOUNTER — Ambulatory Visit (INDEPENDENT_AMBULATORY_CARE_PROVIDER_SITE_OTHER): Payer: PPO

## 2023-02-15 DIAGNOSIS — D509 Iron deficiency anemia, unspecified: Secondary | ICD-10-CM | POA: Diagnosis not present

## 2023-02-15 DIAGNOSIS — R7303 Prediabetes: Secondary | ICD-10-CM | POA: Diagnosis not present

## 2023-02-15 DIAGNOSIS — E782 Mixed hyperlipidemia: Secondary | ICD-10-CM | POA: Diagnosis not present

## 2023-02-15 DIAGNOSIS — J309 Allergic rhinitis, unspecified: Secondary | ICD-10-CM

## 2023-02-15 DIAGNOSIS — E790 Hyperuricemia without signs of inflammatory arthritis and tophaceous disease: Secondary | ICD-10-CM | POA: Diagnosis not present

## 2023-02-21 DIAGNOSIS — K219 Gastro-esophageal reflux disease without esophagitis: Secondary | ICD-10-CM | POA: Diagnosis not present

## 2023-02-21 DIAGNOSIS — I10 Essential (primary) hypertension: Secondary | ICD-10-CM | POA: Diagnosis not present

## 2023-02-21 DIAGNOSIS — E782 Mixed hyperlipidemia: Secondary | ICD-10-CM | POA: Diagnosis not present

## 2023-02-21 DIAGNOSIS — J310 Chronic rhinitis: Secondary | ICD-10-CM | POA: Diagnosis not present

## 2023-02-21 DIAGNOSIS — N1831 Chronic kidney disease, stage 3a: Secondary | ICD-10-CM | POA: Diagnosis not present

## 2023-02-21 DIAGNOSIS — M25571 Pain in right ankle and joints of right foot: Secondary | ICD-10-CM | POA: Insufficient documentation

## 2023-02-21 DIAGNOSIS — K581 Irritable bowel syndrome with constipation: Secondary | ICD-10-CM | POA: Diagnosis not present

## 2023-02-21 DIAGNOSIS — K644 Residual hemorrhoidal skin tags: Secondary | ICD-10-CM | POA: Diagnosis not present

## 2023-02-21 DIAGNOSIS — J4530 Mild persistent asthma, uncomplicated: Secondary | ICD-10-CM | POA: Diagnosis not present

## 2023-02-21 DIAGNOSIS — F411 Generalized anxiety disorder: Secondary | ICD-10-CM | POA: Diagnosis not present

## 2023-02-21 DIAGNOSIS — I129 Hypertensive chronic kidney disease with stage 1 through stage 4 chronic kidney disease, or unspecified chronic kidney disease: Secondary | ICD-10-CM | POA: Diagnosis not present

## 2023-02-21 DIAGNOSIS — Z23 Encounter for immunization: Secondary | ICD-10-CM | POA: Diagnosis not present

## 2023-02-21 DIAGNOSIS — M545 Low back pain, unspecified: Secondary | ICD-10-CM | POA: Diagnosis not present

## 2023-02-27 ENCOUNTER — Ambulatory Visit (INDEPENDENT_AMBULATORY_CARE_PROVIDER_SITE_OTHER): Payer: PPO

## 2023-02-27 DIAGNOSIS — J309 Allergic rhinitis, unspecified: Secondary | ICD-10-CM | POA: Diagnosis not present

## 2023-02-27 DIAGNOSIS — Z760 Encounter for issue of repeat prescription: Secondary | ICD-10-CM

## 2023-02-27 NOTE — Progress Notes (Unsigned)
   02/27/2023  Patient ID: Kendra Orozco, female   DOB: 1945/11/14, 77 y.o.   MRN: [id number]  Brief Summary:   The patient was contacted regarding enrollment in the patient assistance program, and Kendra Orozco expressed concerns about the affordability of Symbicort. According to Dr. Scharlene Gloss notes, the patient is taking both Breztri and Symbicort. However, Markus Daft is not listed as an active medication in Dr. Scharlene Gloss EMR or in the [facility] system, and the patient follows allergist Dr. Dellis Anes and ENT specialist Dr. Suszanne Conners. Kendra Orozco also mentioned that she uses Symbicort as needed, with the last use approximately two months ago, due to concerns about skin irritation and thinning. The patient reported that Markus Daft helps to "keep my airway open."  However, a chart review indicates that Markus Daft was discontinued in March 2024, and a note from Aug 24, 2022, states that the patient could not tolerate Breztri. In a previous conversation, Kendra Orozco also indicated that she could not tolerate Breo. Additionally, a note from June 06, 2022, indicates that she was approved for Symbicort patient assistance through the end of this year.  This pharmacist explained to the patient that, due to the unclear use of Breztri and Symbicort, the provider would be notified of the cost concerns and assess therapy for cost savings opportunities if therapeutically appropriate. The patient was also encouraged to have the provider submit a referral for patient assistance if needed.   Thank you for allowing pharmacy to be a part of this patient's care.  Cephus Shelling, PharmD Clinical Pharmacist Cell: [phone]

## 2023-02-28 NOTE — Progress Notes (Signed)
Thanks for reaching out! I reviewed her notes and yes she did have a reaction to the [place], so she should not be using that. I instead was using Symbicort (since she felt that this was the most effective). To make it cheaper for her, I did tell her to use this 1-2 puffs once daily to make one inhaler last longer. I agree that she should not be on BOTH Symbicort and Breztri.  We can bring her in and ask her ot bring in her inhalers so we can confirm what she is supposed to be taking.  On another note - what is the Symbicort copay program? I was under the impression that [place] Symbicort (and was only covering [place]).   Malachi Bonds, MD [facility]

## 2023-03-06 ENCOUNTER — Ambulatory Visit (INDEPENDENT_AMBULATORY_CARE_PROVIDER_SITE_OTHER): Payer: PPO

## 2023-03-06 DIAGNOSIS — J309 Allergic rhinitis, unspecified: Secondary | ICD-10-CM

## 2023-03-11 NOTE — Progress Notes (Signed)
Please see below.

## 2023-03-11 NOTE — Progress Notes (Signed)
Patient put in for a telephone visit with Dr. Dellis Anes on tomorrow. Patient states Dr. Margo Aye is requesting her to stop certain medications that Dr. Dellis Anes & Dr. Suszanne Conners have put her on.  I informed patient a visit with Dr. Dellis Anes will help try to get [place] on all on the same page.

## 2023-03-12 ENCOUNTER — Encounter: Payer: Self-pay | Admitting: Allergy & Immunology

## 2023-03-12 ENCOUNTER — Ambulatory Visit (INDEPENDENT_AMBULATORY_CARE_PROVIDER_SITE_OTHER): Payer: PPO | Admitting: Allergy & Immunology

## 2023-03-12 DIAGNOSIS — R0982 Postnasal drip: Secondary | ICD-10-CM | POA: Diagnosis not present

## 2023-03-12 DIAGNOSIS — J4540 Moderate persistent asthma, uncomplicated: Secondary | ICD-10-CM

## 2023-03-12 DIAGNOSIS — K219 Gastro-esophageal reflux disease without esophagitis: Secondary | ICD-10-CM | POA: Diagnosis not present

## 2023-03-12 DIAGNOSIS — J302 Other seasonal allergic rhinitis: Secondary | ICD-10-CM | POA: Diagnosis not present

## 2023-03-12 DIAGNOSIS — J3089 Other allergic rhinitis: Secondary | ICD-10-CM | POA: Diagnosis not present

## 2023-03-12 MED ORDER — IPRATROPIUM BROMIDE 0.06 % NA SOLN: 2.0000 | Freq: Two times a day (BID) | NASAL | 5 refills | Status: DC

## 2023-03-12 NOTE — Progress Notes (Signed)
RE: Kendra Orozco MRN: [id number] DOB: Jul 30, 1945 Date of Telemedicine Visit: 03/12/2023  Referring provider: Benita Stabile, MD Primary care provider: Benita Stabile, MD  Chief Complaint: Medication Management   Telemedicine Follow Up Visit via Telephone: I connected with Kendra Orozco for a follow up on 03/12/23 by telephone and verified that I am speaking with the correct person using two identifiers.   I discussed the limitations, risks, security and privacy concerns of performing an evaluation and management service by telephone and the availability of in person appointments. I also discussed with the patient that there may be a patient responsible charge related to this service. The patient expressed understanding and agreed to proceed.  Patient is at home.  Provider is at the office.  Visit start time: 3:15 PM Visit end time: 3:35 PM Insurance consent/check in by: Joni Reining Medical consent and medical assistant/nurse: Dr. Reece Agar  History of Present Illness:  She is a 77 y.o. female, who is being followed for perennial and seasonal allergic rhinitis as well as moderate persistent asthma.. Her previous allergy office visit was in September 2024 with myself.  At that visit, we did not do lung testing.  She was doing very well with Symbicort up to 2 puffs twice daily with a spacer.  We also continue with albuterol as needed.  For her rhinitis, she had previous testing that was reactive to grass, mold, dust mites, cats, and dog.  We continue with her ipratropium 1 spray per nostril twice daily as needed for her runny nose.  In the interim, a pharmacist reach out to me was helping her with her medications she reports that she was confused about what medication she was supposed to be on.  Therefore, we scheduled an appointment so we can talk about this tomorrow.  Asthma/Respiratory Symptom History: She is not sure how expensive the Symbicort is. She has changed from [employer]. They shut  down and things have been a mess since that time. 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  She was having some issues with muscle tension in her throat with the use of the Breztri. When she stopped using the [facility], this improved. She took some nerve pills and other medications to keep this down. She has not tried it since that time. She had a hard time relaxing enough to breathe, but it finally improved. She has been excellent on the Symbicort. She will occasionally get a sore throat from the Symbicort, but she does not have the throat tension. She also has a rescue medication.   She thinks a lot of the confusion is from when she transferred from 1 pharmacy to another.  She is hoping to get everything straightened out pretty soon.  Allergic Rhinitis Symptom History: She uses the ipratropium which does well to treat her nasal symptoms.  Allergy shots are going well.  She will occasionally have some itchy spots from the shots, but in general she feels like things are going very well.  She has not had any anaphylaxis from her allergy shots.  She gets them weekly at this point.  Otherwise, there have been no changes to her past medical history, surgical history, family history, or social history.  Assessment and Plan:  Aleighia is a 77 y.o. female with:  Moderate persistent asthma, uncomplicated   Perennial and seasonal allergic rhinitis (grass, molds, dust mites, cats, and dog) - interested in starting allergen immunotherapy   Food intolerance   Rash - resolved  GERD with multiple gastric polyps - on Dexilant 60 mg   1. Moderate persistent asthma, uncomplicated - Lung testing not done today. - It seems that you are controlling things very effectively with the Symbicort.  - Daily controller medication(s): Symbicort up to two puffs twice daily with spacer. - Prior to physical activity: albuterol 2 puffs 10-15 minutes before physical activity. - Rescue medications: albuterol 4 puffs  every 4-6 hours as needed - Asthma control goals:  * Full participation in all desired activities (may need albuterol before activity) * Albuterol use two time or less a week on average (not counting use with activity) * Cough interfering with sleep two time or less a month * Oral steroids no more than once a year * No hospitalizations  2. Chronic rhinitis - Previous testing was reactive to grass, molds, dust mites, cats, and dog. - You have reached the Red Vial, so hopefully next year will be much better symptom wise. - Continue with: ipratropium one spray per nostril twice daily as needed like you are doing  3. Follow up as scheduled in 2025  Diagnostics: None.  Medication List:  Current Outpatient Medications  Medication Sig Dispense Refill   albuterol (VENTOLIN HFA) 108 (90 Base) MCG/ACT inhaler Inhale 2 puffs into the lungs every 4 (four) hours as needed for wheezing or shortness of breath. 18 g 1   benzonatate (TESSALON PERLES) 100 MG capsule Take 1 capsule (100 mg total) by mouth 3 (three) times daily as needed for cough. 20 capsule 1   budesonide-formoterol (SYMBICORT) 160-4.5 MCG/ACT inhaler Inhale 2 puffs into the lungs 2 (two) times daily. 11 g 3   calcium carbonate (OS-CAL - DOSED IN MG OF ELEMENTAL CALCIUM) 1250 (500 Ca) MG tablet Chew 1 tablet by mouth 2 (two) times a week.      dexlansoprazole (DEXILANT) 60 MG capsule TAKE ONE CAPSULE BY MOUTH EVERY DAY 90 capsule 3   docusate sodium (COLACE) 250 MG capsule Take 250 mg by mouth 2 (two) times a week.     EPINEPHrine (EPIPEN 2-PAK) 0.3 mg/0.3 mL IJ SOAJ injection Inject 0.3 mg into the muscle as needed for anaphylaxis. 2 each 1   Fluticasone Propionate (FLONASE NA) Place into the nose.     hydrocortisone 2.5 % cream Apply 2.5 Applications topically daily as needed (hemorrhoids).     ibuprofen (ADVIL,MOTRIN) 200 MG tablet Take 400 mg by mouth daily as needed for mild pain or moderate pain (Back pain).     ipratropium  (ATROVENT) 0.06 % nasal spray Place 2 sprays into both nostrils 2 (two) times daily. 15 mL 5   levocetirizine (XYZAL) 5 MG tablet Take 5 mg by mouth at bedtime.     LORazepam (ATIVAN) 1 MG tablet Take 0.5 mg by mouth 2 (two) times daily.     lubiprostone (AMITIZA) 24 MCG capsule Take 24 mcg by mouth 2 (two) times daily with a meal.     Menthol-Methyl Salicylate (MUSCLE RUB) 10-15 % CREA Apply 1 application  topically daily as needed for muscle pain.     methocarbamol (ROBAXIN) 500 MG tablet Take 500 mg by mouth daily as needed for muscle spasms.     Multiple Vitamin (MULTIVITAMIN WITH MINERALS) TABS tablet Take 1 tablet by mouth daily.     olmesartan (BENICAR) 40 MG tablet Take 40 mg by mouth daily.     ondansetron (ZOFRAN) 4 MG tablet Take 1 tablet (4 mg total) by mouth every 6 (six) hours as needed for nausea.  20 tablet 0   Polyvinyl Alcohol-Povidone (REFRESH OP) Place 1 drop into both eyes daily as needed (dry eyes).     potassium chloride SA (KLOR-CON M) 20 MEQ tablet Take 10 mEq by mouth daily.     Spacer/Aero-Holding Chambers DEVI 1 Device by Does not apply route as directed. 1 each 1   torsemide (DEMADEX) 20 MG tablet Take 10 mg by mouth daily.     triamcinolone (KENALOG) 0.1 % Apply 1 Application topically daily at 4 PM.     Wheat Dextrin (BENEFIBER) POWD Take 1 Package by mouth 2 (two) times daily.     No current facility-administered medications for this visit.   Allergies: Allergies  Allergen Reactions   Pantoprazole Sodium     Constipation, GI pain   Plastibase     Other reaction(s): hives   Latex Rash   Sulfa Antibiotics Swelling   I reviewed her past medical history, social history, family history, and environmental history and no significant changes have been reported from previous visits.  Review of Systems  Constitutional: Negative.  Negative for fever.  HENT: Negative.  Negative for congestion, ear discharge and ear pain.   Eyes:  Negative for pain, discharge and  redness.  Respiratory:  Negative for cough, shortness of breath and wheezing.   Cardiovascular: Negative.  Negative for chest pain and palpitations.  Gastrointestinal:  Negative for abdominal pain.  Skin: Negative.  Negative for rash.  Allergic/Immunologic: Positive for environmental allergies. Negative for food allergies.  Neurological:  Negative for dizziness and headaches.  Hematological:  Does not bruise/bleed easily.    Objective:  Physical exam not obtained as encounter was done via telephone.   Previous notes and tests were reviewed.  I discussed the assessment and treatment plan with the patient. The patient was provided an opportunity to ask questions and all were answered. The patient agreed with the plan and demonstrated an understanding of the instructions.   The patient was advised to call back or seek an in-person evaluation if the symptoms worsen or if the condition fails to improve as anticipated.  I provided 20 minutes of non-face-to-face time during this encounter.  It was my pleasure to participate in Acelin Punt's care today. Please feel free to contact me with any questions or concerns.   Sincerely,  Alfonse Spruce, MD

## 2023-03-12 NOTE — Progress Notes (Signed)
Talked to patient today. She is NOT using the Breztri at all and prefers to just remain on the Symbicort instead. She also has the rescue medication. She thinks that a lot of the confusion is related to her transferring her prescriptions to a different pharmacy [facility]).   Malachi Bonds, MD [facility]

## 2023-03-12 NOTE — Patient Instructions (Addendum)
1. Moderate persistent asthma, uncomplicated - Lung testing not done today. - It seems that you are controlling things very effectively with the Symbicort.  - Daily controller medication(s): Symbicort up to two puffs twice daily with spacer. - Prior to physical activity: albuterol 2 puffs 10-15 minutes before physical activity. - Rescue medications: albuterol 4 puffs every 4-6 hours as needed - Asthma control goals:  * Full participation in all desired activities (may need albuterol before activity) * Albuterol use two time or less a week on average (not counting use with activity) * Cough interfering with sleep two time or less a month * Oral steroids no more than once a year * No hospitalizations  2. Chronic rhinitis - Previous testing was reactive to grass, molds, dust mites, cats, and dog. - You have reached the Red Vial, so hopefully next year will be much better symptom wise. - Continue with: ipratropium one spray per nostril twice daily as needed like you are doing  3. Follow up as scheduled in 2025.   Please inform us of any Emergency Department visits, hospitalizations, or changes in symptoms. Call us before going to the ED for breathing or allergy symptoms since we might be able to fit you in for a sick visit. Feel free to contact us anytime with any questions, problems, or concerns.  It was a pleasure to see you again today!  Websites that have reliable patient information: 1. American Academy of Asthma, Allergy, and Immunology: www.aaaai.org 2. Food Allergy Research and Education (FARE): foodallergy.org 3. Mothers of Asthmatics: http://www.asthmacommunitynetwork.org 4. American College of Allergy, Asthma, and Immunology: www.acaai.org   COVID-19 Vaccine Information can be found at: [email or web address] For questions related to vaccine distribution or appointments, please email [email or web address] or call  [phone].   We realize that you might be concerned about having an allergic reaction to the COVID19 vaccines. To help with that concern, WE ARE OFFERING THE COVID19 VACCINES IN OUR OFFICE! Ask the front desk for dates!     "Like" [place] on Facebook and Instagram for our latest updates!      A healthy democracy works best when [employer] participate! Make sure you are registered to vote! If you have moved or changed any of your contact information, you will need to get this updated before voting!  In some cases, you MAY be able to register to vote online: [email or web address]

## 2023-03-13 ENCOUNTER — Ambulatory Visit (INDEPENDENT_AMBULATORY_CARE_PROVIDER_SITE_OTHER): Payer: PPO | Admitting: *Deleted

## 2023-03-13 DIAGNOSIS — J309 Allergic rhinitis, unspecified: Secondary | ICD-10-CM

## 2023-03-22 ENCOUNTER — Ambulatory Visit (INDEPENDENT_AMBULATORY_CARE_PROVIDER_SITE_OTHER): Payer: PPO

## 2023-03-22 DIAGNOSIS — J309 Allergic rhinitis, unspecified: Secondary | ICD-10-CM

## 2023-03-22 IMAGING — MG MM DIGITAL SCREENING BILAT W/ TOMO AND CAD
8 series · 9 of 24 positions shown · non-contrast
Comparison: Previous exam(s).

CLINICAL DATA: Screening.

EXAM:
DIGITAL SCREENING BILATERAL MAMMOGRAM WITH TOMOSYNTHESIS AND CAD
TECHNIQUE: Bilateral screening digital craniocaudal and mediolateral oblique
mammograms were obtained. Bilateral screening digital breast
tomosynthesis was performed. The images were evaluated with
computer-aided detection.

[R MLO synth-2D]
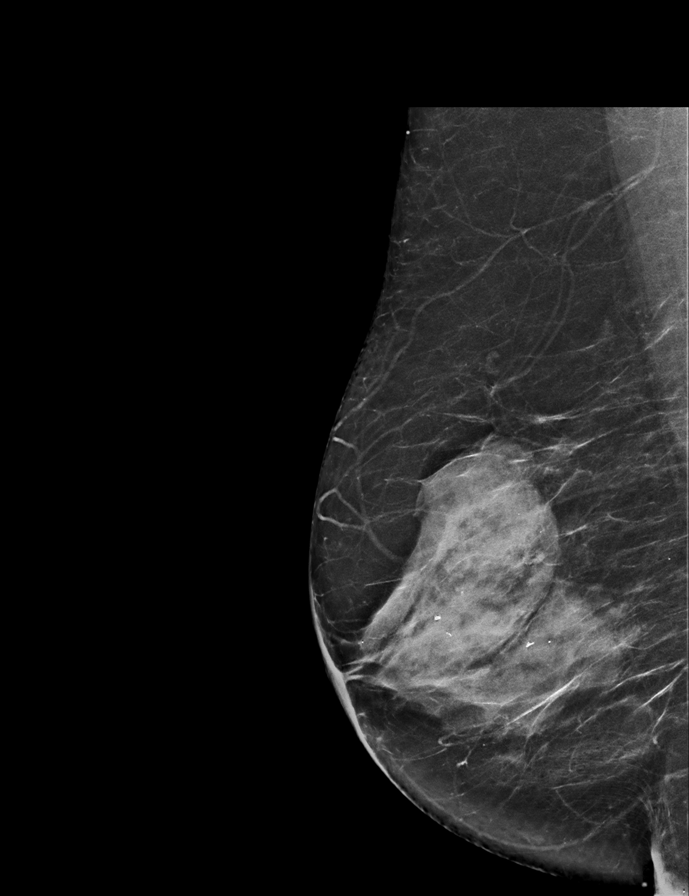

[L MLO synth-2D]
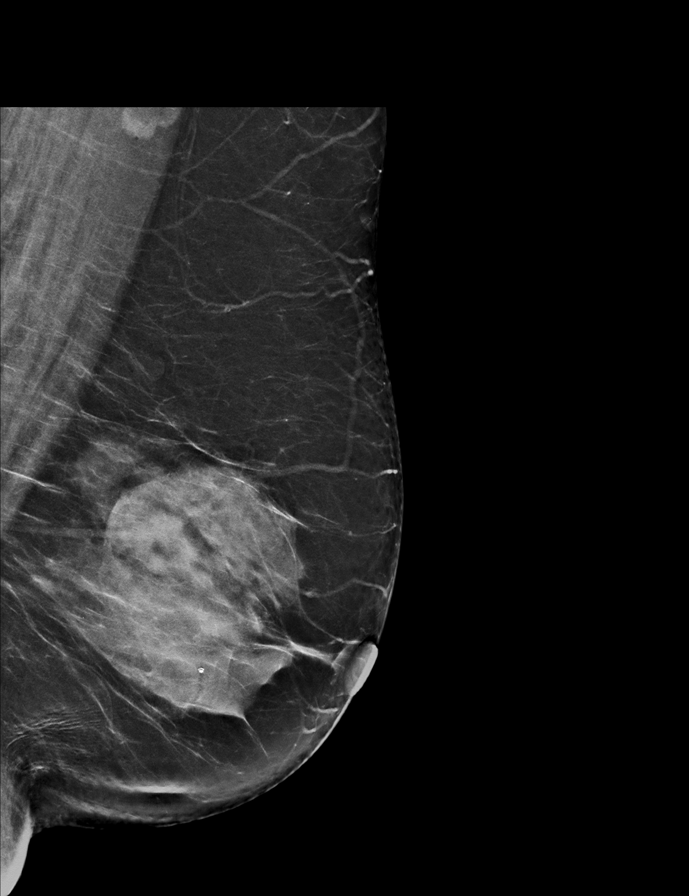

[R CC synth-2D]
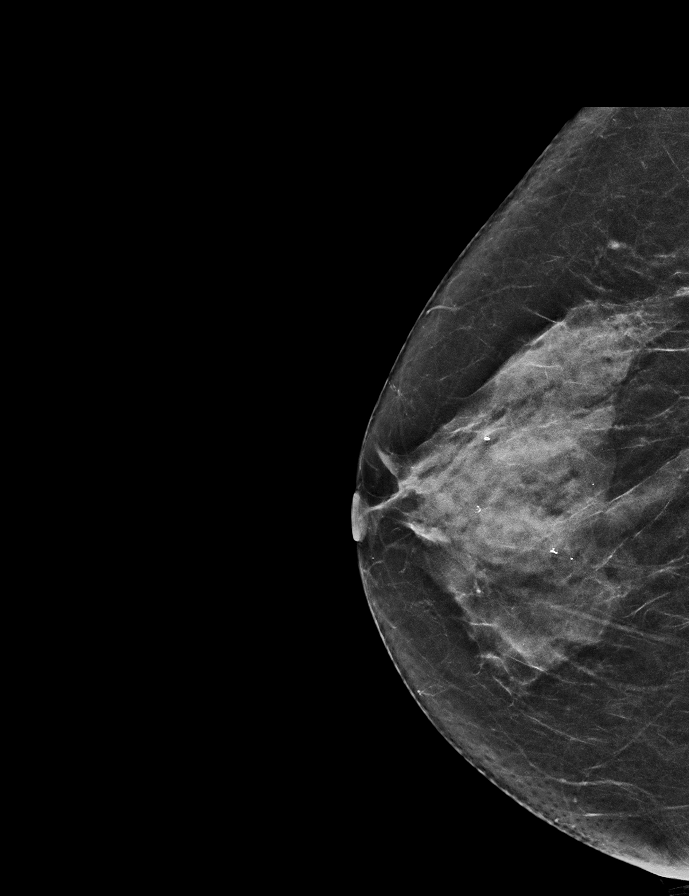

[L CC synth-2D]
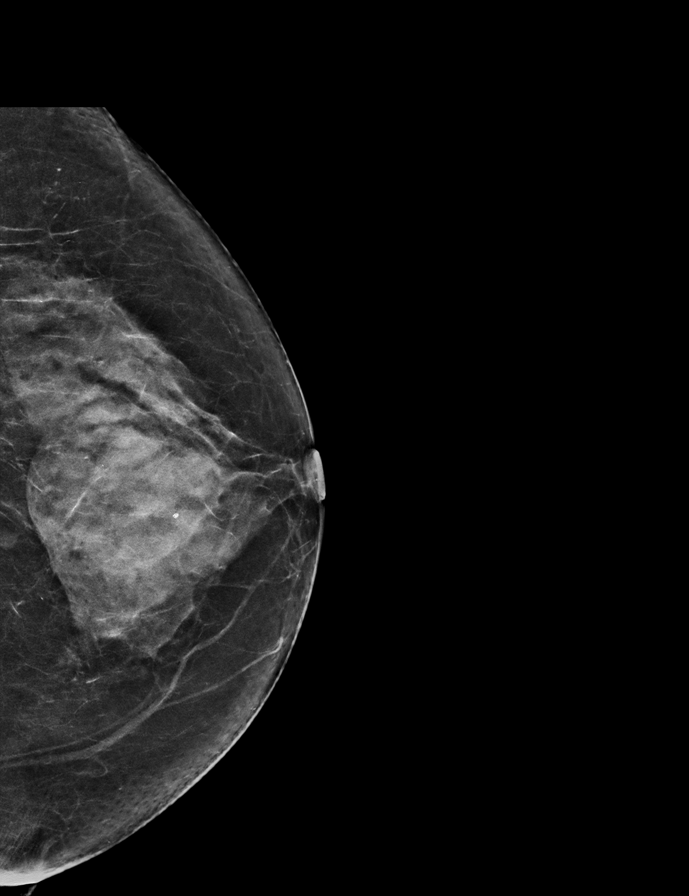

[L MLO tomo · 2 of 72 frames shown]
[frame 24/72]
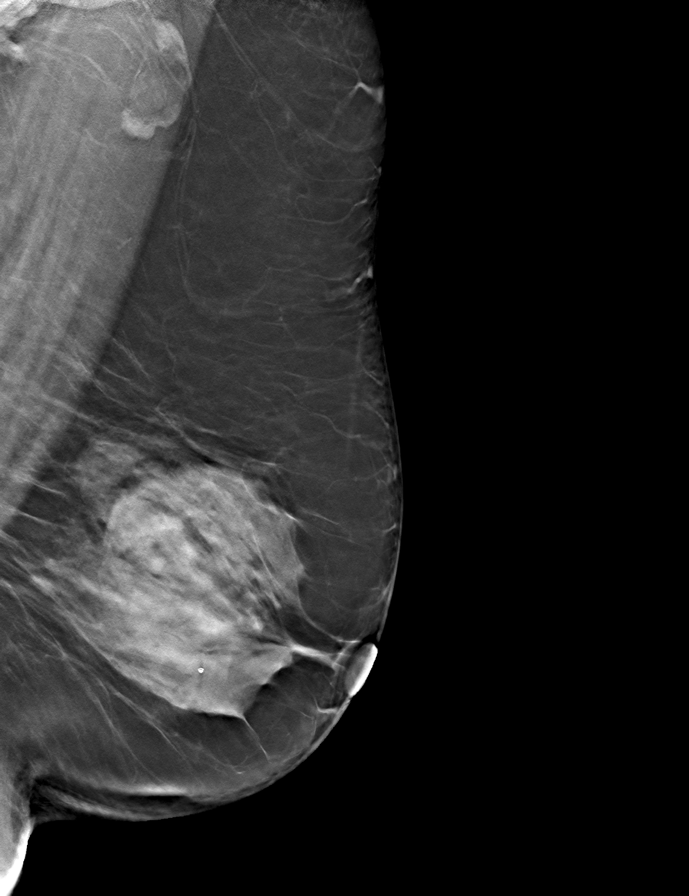
[frame 37/72]
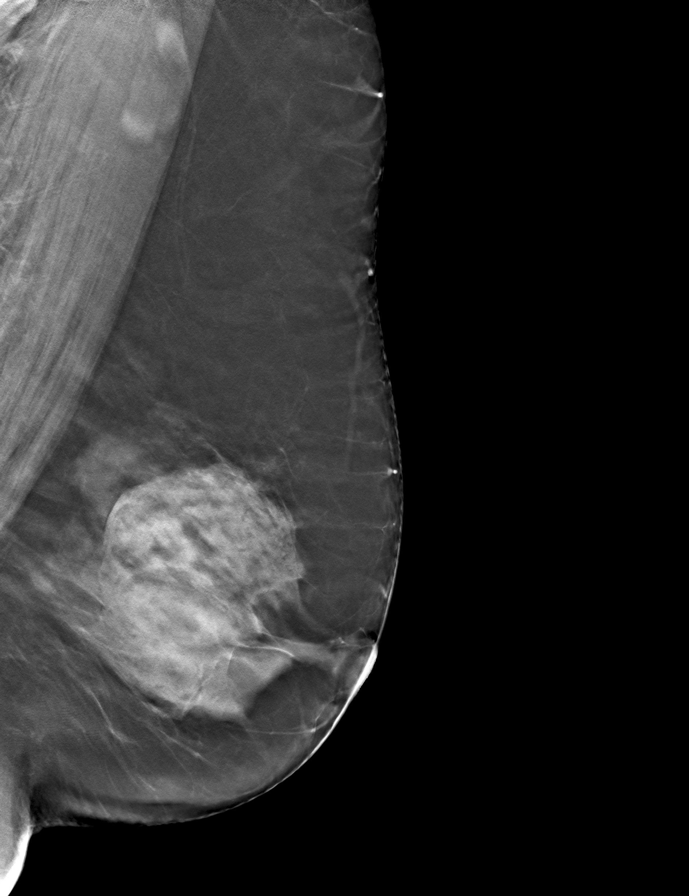

[L CC tomo · tomo slice 33/64.0]
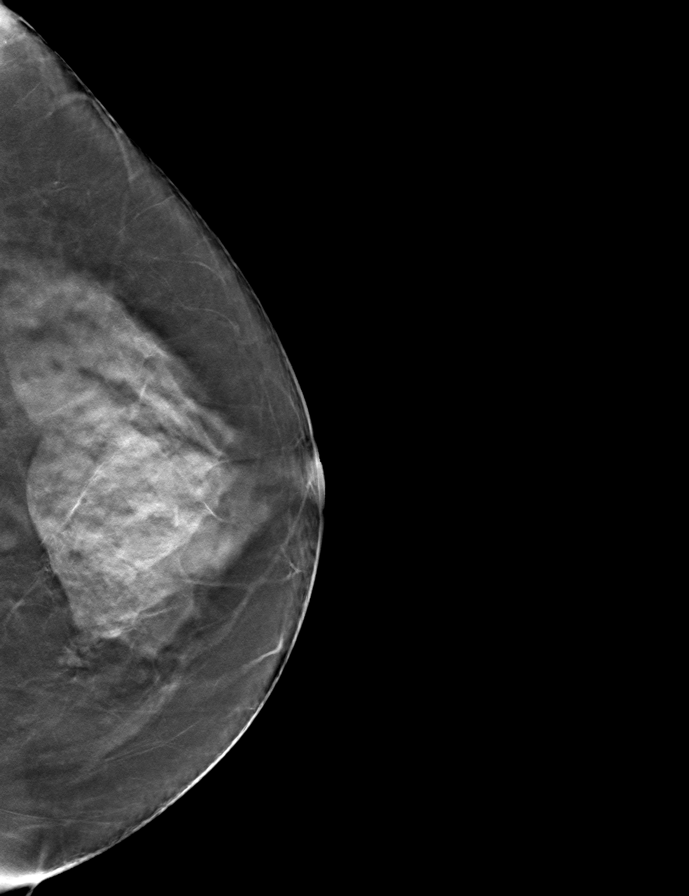

[R MLO tomo · tomo slice 35/68.0]
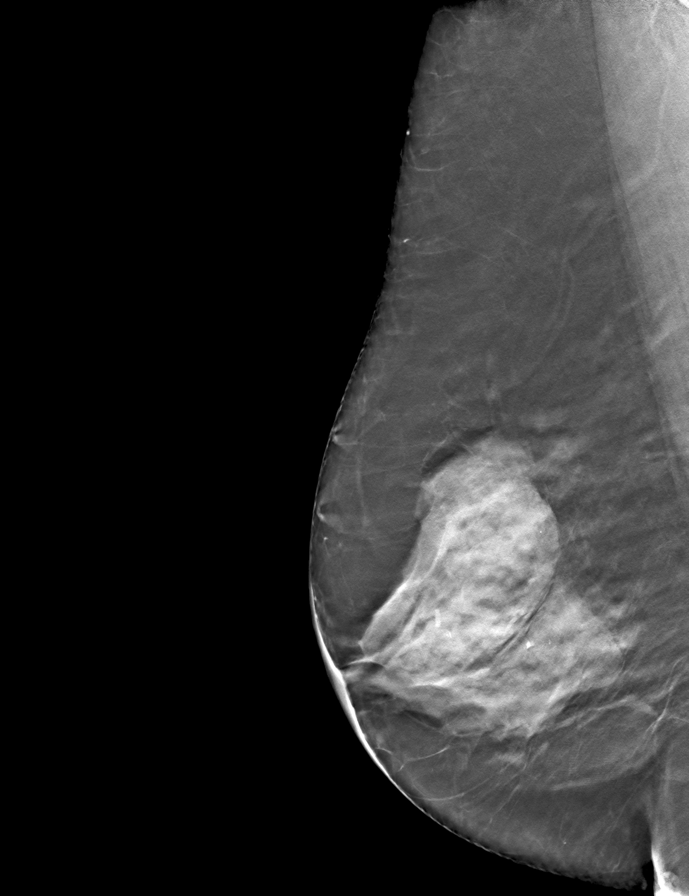

[R CC tomo · tomo slice 31/61.0]
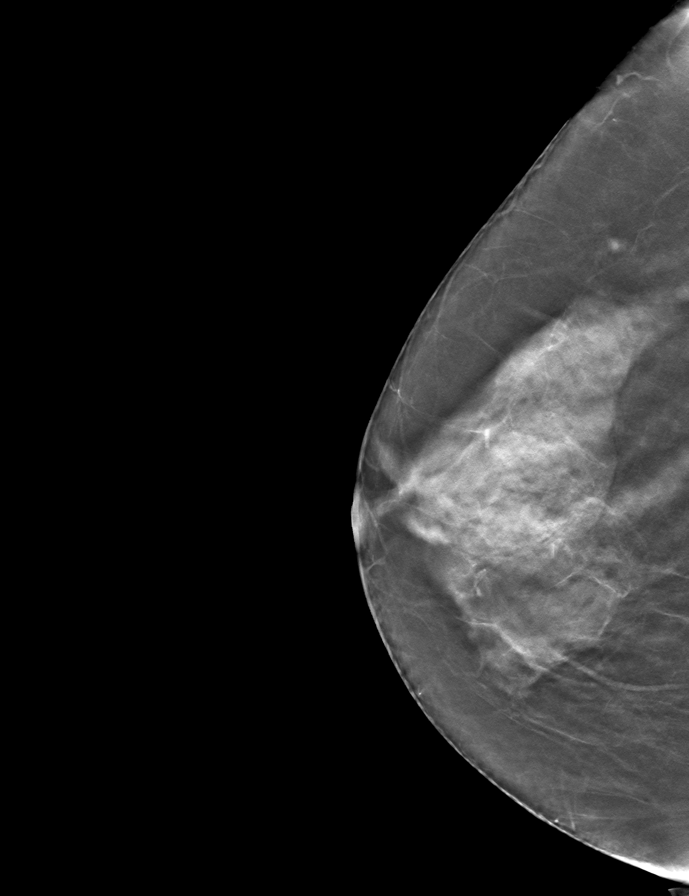

[9 of 24 positions shown; findings below may reference images not displayed]

ACR Breast Density Category c: The breast tissue is heterogeneously
dense, which may obscure small masses.
FINDINGS: There are no findings suspicious for malignancy.
IMPRESSION: No mammographic evidence of malignancy. A result letter of this
screening mammogram will be mailed directly to the patient.

RECOMMENDATION:
Screening mammogram in one year. (Code:[id number])

BI-RADS CATEGORY  1: Negative.

## 2023-03-29 ENCOUNTER — Ambulatory Visit (INDEPENDENT_AMBULATORY_CARE_PROVIDER_SITE_OTHER): Payer: Self-pay

## 2023-03-29 DIAGNOSIS — J309 Allergic rhinitis, unspecified: Secondary | ICD-10-CM

## 2023-04-05 ENCOUNTER — Ambulatory Visit (INDEPENDENT_AMBULATORY_CARE_PROVIDER_SITE_OTHER): Payer: PPO

## 2023-04-05 DIAGNOSIS — J309 Allergic rhinitis, unspecified: Secondary | ICD-10-CM | POA: Diagnosis not present

## 2023-04-08 ENCOUNTER — Ambulatory Visit (INDEPENDENT_AMBULATORY_CARE_PROVIDER_SITE_OTHER): Payer: Self-pay

## 2023-04-08 DIAGNOSIS — J309 Allergic rhinitis, unspecified: Secondary | ICD-10-CM | POA: Diagnosis not present

## 2023-04-19 ENCOUNTER — Ambulatory Visit (INDEPENDENT_AMBULATORY_CARE_PROVIDER_SITE_OTHER): Payer: Self-pay

## 2023-04-19 DIAGNOSIS — J309 Allergic rhinitis, unspecified: Secondary | ICD-10-CM

## 2023-04-25 DIAGNOSIS — J302 Other seasonal allergic rhinitis: Secondary | ICD-10-CM | POA: Diagnosis not present

## 2023-04-25 NOTE — Progress Notes (Signed)
 EXP 04/24/24

## 2023-04-26 ENCOUNTER — Ambulatory Visit (INDEPENDENT_AMBULATORY_CARE_PROVIDER_SITE_OTHER): Payer: Self-pay

## 2023-04-26 DIAGNOSIS — J309 Allergic rhinitis, unspecified: Secondary | ICD-10-CM

## 2023-04-29 DIAGNOSIS — J3089 Other allergic rhinitis: Secondary | ICD-10-CM | POA: Diagnosis not present

## 2023-05-01 ENCOUNTER — Other Ambulatory Visit: Payer: Self-pay

## 2023-05-01 ENCOUNTER — Telehealth: Payer: Self-pay | Admitting: Internal Medicine

## 2023-05-01 MED ORDER — LUBIPROSTONE 24 MCG PO CAPS: 24.0000 ug | ORAL_CAPSULE | Freq: Two times a day (BID) | ORAL | 11 refills | Status: DC

## 2023-05-01 NOTE — Telephone Encounter (Signed)
 Rx has been sent to pharmacy, pt has been made aware and verbalized understanding.

## 2023-05-01 NOTE — Telephone Encounter (Signed)
 Patient came into office to get a refill for Amitiza  at [facility].  She is on the recall list for appt in March.

## 2023-05-03 ENCOUNTER — Ambulatory Visit (INDEPENDENT_AMBULATORY_CARE_PROVIDER_SITE_OTHER): Payer: Self-pay

## 2023-05-03 ENCOUNTER — Other Ambulatory Visit: Payer: Self-pay | Admitting: Allergy & Immunology

## 2023-05-03 DIAGNOSIS — J309 Allergic rhinitis, unspecified: Secondary | ICD-10-CM | POA: Diagnosis not present

## 2023-05-10 ENCOUNTER — Telehealth: Payer: Self-pay | Admitting: Allergy & Immunology

## 2023-05-10 ENCOUNTER — Other Ambulatory Visit: Payer: Self-pay | Admitting: *Deleted

## 2023-05-10 MED ORDER — IPRATROPIUM BROMIDE 0.06 % NA SOLN: 2.0000 | Freq: Two times a day (BID) | NASAL | 5 refills | Status: DC

## 2023-05-10 NOTE — Telephone Encounter (Signed)
Patient came in today stating she needs a refill on Atrovent sent to [facility].

## 2023-05-10 NOTE — Telephone Encounter (Signed)
Refills have been sent in. Called and informed the patient, patient verbalized understanding.

## 2023-05-15 ENCOUNTER — Ambulatory Visit (INDEPENDENT_AMBULATORY_CARE_PROVIDER_SITE_OTHER): Payer: Self-pay | Admitting: *Deleted

## 2023-05-15 DIAGNOSIS — J309 Allergic rhinitis, unspecified: Secondary | ICD-10-CM

## 2023-05-22 ENCOUNTER — Ambulatory Visit (INDEPENDENT_AMBULATORY_CARE_PROVIDER_SITE_OTHER): Payer: Self-pay

## 2023-05-22 DIAGNOSIS — J309 Allergic rhinitis, unspecified: Secondary | ICD-10-CM | POA: Diagnosis not present

## 2023-05-31 ENCOUNTER — Ambulatory Visit (INDEPENDENT_AMBULATORY_CARE_PROVIDER_SITE_OTHER): Payer: PPO | Admitting: *Deleted

## 2023-05-31 ENCOUNTER — Ambulatory Visit: Payer: PPO | Attending: Cardiology | Admitting: Cardiology

## 2023-05-31 ENCOUNTER — Encounter: Payer: Self-pay | Admitting: Cardiology

## 2023-05-31 VITALS — BP 114/68 | HR 109 | Ht 64.0 in | Wt 212.0 lb

## 2023-05-31 DIAGNOSIS — I10 Essential (primary) hypertension: Secondary | ICD-10-CM | POA: Diagnosis not present

## 2023-05-31 DIAGNOSIS — R0789 Other chest pain: Secondary | ICD-10-CM

## 2023-05-31 DIAGNOSIS — J309 Allergic rhinitis, unspecified: Secondary | ICD-10-CM | POA: Diagnosis not present

## 2023-05-31 DIAGNOSIS — I491 Atrial premature depolarization: Secondary | ICD-10-CM

## 2023-05-31 NOTE — Progress Notes (Signed)
Clinical Summary Kendra Orozco is a 78 y.o.female seen today for follow up of the following medical problems.    1. Chest pain - chronic atypical chest pains  -most recently has had  bilateral pain ribs L>R, sharp like pain. Worst with position, can be tender to palpation, worst with coughing.      2. HTN - compliant with meds - home bps 116-125/60s-80s   3. Palpitations/PACs - prior PACs noted on EKG - rare palpitations   Past Medical History:  Diagnosis Date   Angio-edema    Anxiety    Arthritis    Asthma    Cancer (HCC)    skin and colon   GERD (gastroesophageal reflux disease)    Gout    Hypertension    Urticaria      Allergies  Allergen Reactions   Pantoprazole Sodium     Constipation, GI pain   Plastibase     Other reaction(s): hives   Latex Rash   Sulfa Antibiotics Swelling     Current Outpatient Medications  Medication Sig Dispense Refill   albuterol (VENTOLIN HFA) 108 (90 Base) MCG/ACT inhaler INHALE 2 PUFFS INTO THE LUNGS EVERY 4 HOURS AS NEEDED. 18 g 1   benzonatate (TESSALON PERLES) 100 MG capsule Take 1 capsule (100 mg total) by mouth 3 (three) times daily as needed for cough. 20 capsule 1   budesonide-formoterol (SYMBICORT) 160-4.5 MCG/ACT inhaler Inhale 2 puffs into the lungs 2 (two) times daily. 11 g 3   calcium carbonate (OS-CAL - DOSED IN MG OF ELEMENTAL CALCIUM) 1250 (500 Ca) MG tablet Chew 1 tablet by mouth 2 (two) times a week.      dexlansoprazole (DEXILANT) 60 MG capsule TAKE ONE CAPSULE BY MOUTH EVERY DAY 90 capsule 3   docusate sodium (COLACE) 250 MG capsule Take 250 mg by mouth 2 (two) times a week.     EPINEPHrine (EPIPEN 2-PAK) 0.3 mg/0.3 mL IJ SOAJ injection Inject 0.3 mg into the muscle as needed for anaphylaxis. 2 each 1   Fluticasone Propionate (FLONASE NA) Place into the nose.     hydrocortisone 2.5 % cream Apply 2.5 Applications topically daily as needed (hemorrhoids).     ibuprofen (ADVIL,MOTRIN) 200 MG tablet Take 400  mg by mouth daily as needed for mild pain or moderate pain (Back pain).     ipratropium (ATROVENT) 0.06 % nasal spray Place 2 sprays into both nostrils 2 (two) times daily. 15 mL 5   levocetirizine (XYZAL) 5 MG tablet Take 5 mg by mouth at bedtime.     LORazepam (ATIVAN) 1 MG tablet Take 0.5 mg by mouth 2 (two) times daily.     lubiprostone (AMITIZA) 24 MCG capsule Take 1 capsule (24 mcg total) by mouth 2 (two) times daily with a meal. 60 capsule 11   Menthol-Methyl Salicylate (MUSCLE RUB) 10-15 % CREA Apply 1 application  topically daily as needed for muscle pain.     methocarbamol (ROBAXIN) 500 MG tablet Take 500 mg by mouth daily as needed for muscle spasms.     Multiple Vitamin (MULTIVITAMIN WITH MINERALS) TABS tablet Take 1 tablet by mouth daily.     olmesartan (BENICAR) 40 MG tablet Take 40 mg by mouth daily.     ondansetron (ZOFRAN) 4 MG tablet Take 1 tablet (4 mg total) by mouth every 6 (six) hours as needed for nausea. 20 tablet 0   Polyvinyl Alcohol-Povidone (REFRESH OP) Place 1 drop into both eyes daily as needed (dry  eyes).     potassium chloride SA (KLOR-CON M) 20 MEQ tablet Take 10 mEq by mouth daily.     Spacer/Aero-Holding Chambers DEVI 1 Device by Does not apply route as directed. 1 each 1   torsemide (DEMADEX) 20 MG tablet Take 10 mg by mouth daily.     triamcinolone (KENALOG) 0.1 % Apply 1 Application topically daily at 4 PM.     Wheat Dextrin (BENEFIBER) POWD Take 1 Package by mouth 2 (two) times daily.     No current facility-administered medications for this visit.     Past Surgical History:  Procedure Laterality Date   ABDOMINAL HYSTERECTOMY     fibroid tumors   APPENDECTOMY     BACK SURGERY     BIOPSY  01/08/2022   Procedure: BIOPSY;  Surgeon: Corbin Ade, MD;  Location: [facility];  Service: Endoscopy;;   BREAST CYST EXCISION Right    CATARACT EXTRACTION W/PHACO Right 11/20/2019   Procedure: CATARACT EXTRACTION PHACO AND INTRAOCULAR LENS PLACEMENT (IOC)  RIGHT EYE;  Surgeon: Fabio Pierce, MD;  Location: [facility];  Service: Ophthalmology;  Laterality: Right;  CDE: 17.30   CATARACT EXTRACTION W/PHACO Left 12/04/2019   Procedure: CATARACT EXTRACTION PHACO AND INTRAOCULAR LENS PLACEMENT LEFT EYE;  Surgeon: Fabio Pierce, MD;  Location: [facility];  Service: Ophthalmology;  Laterality: Left;  CDE: 14.59   COLONOSCOPY  09/16/2007   ZOX:WRUE canal hemorrhoids, otherwise normal rectum left-sided diverticula and colonic mucosa appeared normal.   COLONOSCOPY N/A 03/02/2013   Dr. Allayne Butcher adenoma. Colonic diverticulosis. Anal canal hemorrhoids-likely source of hematochezia. Surveillance 2019   COLONOSCOPY WITH PROPOFOL N/A 07/29/2017   Non-bleeding internal hemorrhoids, cecal AVMs.    COLONOSCOPY WITH PROPOFOL N/A 03/23/2022   Procedure: COLONOSCOPY WITH PROPOFOL;  Surgeon: Corbin Ade, MD;  Location: [facility];  Service: Endoscopy;  Laterality: N/A;  9:00 am   ESOPHAGOGASTRODUODENOSCOPY  09/16/2007   AVW:[id number] undulating Z-line, tiny distal esophageal  erosions consistent with mild erosive reflux esophagitis, patulous EG junction, small hiatal hernia, otherwise normal stomach, D1, and D2.   ESOPHAGOGASTRODUODENOSCOPY (EGD) WITH PROPOFOL N/A 01/08/2022   Procedure: ESOPHAGOGASTRODUODENOSCOPY (EGD) WITH PROPOFOL;  Surgeon: Corbin Ade, MD;  Location: [facility];  Service: Endoscopy;  Laterality: N/A;  2:30 pm   HEMORRHOID SURGERY     X 2    MALONEY DILATION N/A 01/08/2022   Procedure: MALONEY DILATION;  Surgeon: Corbin Ade, MD;  Location: [facility];  Service: Endoscopy;  Laterality: N/A;   POLYPECTOMY  03/23/2022   Procedure: POLYPECTOMY;  Surgeon: Corbin Ade, MD;  [facility];  Service: Endoscopy;;     Allergies  Allergen Reactions   Pantoprazole Sodium     Constipation, GI pain   Plastibase     Other reaction(s): hives   Latex Rash   Sulfa Antibiotics Swelling      Family History  Problem Relation  Age of Onset   Colon cancer Brother        70, deceased   Breast cancer Sister 64   Breast cancer Maternal Aunt 28   Heart attack Mother    Allergic rhinitis Mother    Allergic rhinitis Father      Social History Kendra Orozco reports that she has quit smoking. Her smoking use included cigarettes. She has a 0.8 pack-year smoking history. She has never used smokeless tobacco. Kendra Orozco reports current alcohol use.      Physical Examination Today's Vitals   05/31/23 0820  BP: 114/68  Pulse: (!) 109  SpO2: 98%  Weight: 212 lb (96.2 kg)  Height: 5\' 4"  (1.626 m)   Body mass index is 36.39 kg/m.  Gen: resting comfortably, no acute distress HEENT: no scleral icterus, pupils equal round and reactive, no palptable cervical adenopathy,  CV: RRR, no mrg, no jvd Resp: Clear to auscultation bilaterally GI: abdomen is soft, non-tender, non-distended, normal bowel sounds, no hepatosplenomegaly MSK: extremities are warm, no edema.  Skin: warm, no rash Neuro:  no focal deficits Psych: appropriate affect       Assessment and Plan   1. Chest pain - long history of atypical symptoms. Most recent symptoms bilateral rib pain worst with position, palpation, and coughing. This is not cardiac in etiology - no testing indicated at this time.    2. Palpitations/PACs - rare symptoms, continue to monitor - EKG today shows mild sinus tach with PACs  3. HTN - at goal, continue current meds   F/u just as needed   Antoine Poche, M.D..

## 2023-05-31 NOTE — Patient Instructions (Signed)
Medication Instructions:  Your physician recommends that you continue on your current medications as directed. Please refer to the Current Medication list given to you today.  *If you need a refill on your cardiac medications before your next appointment, please call your pharmacy*   Lab Work: None If you have labs (blood work) drawn today and your tests are completely normal, you will receive your results only by: MyChart Message (if you have MyChart) OR A paper copy in the mail If you have any lab test that is abnormal or we need to change your treatment, we will call you to review the results.   Testing/Procedures: None   Follow-Up: At [facility], you and your health needs are our priority.  As part of our continuing mission to provide you with exceptional heart care, we have created designated Provider Care Teams.  These Care Teams include your primary Cardiologist (physician) and Advanced Practice Providers (APPs -  Physician Assistants and Nurse Practitioners) who all work together to provide you with the care you need, when you need it.  We recommend signing up for the patient portal called "MyChart".  Sign up information is provided on this After Visit Summary.  MyChart is used to connect with patients for Virtual Visits (Telemedicine).  Patients are able to view lab/test results, encounter notes, upcoming appointments, etc.  Non-urgent messages can be sent to your provider as well.   To learn more about what you can do with MyChart, go to [email or web address].    Your next appointment:    Follow up with provider as needed.    Provider:   You may see Dina Rich, MD or one of the following Advanced Practice Providers on your designated Care Team:   Randall An, PA-C  Jacolyn Reedy, [place]     Other Instructions

## 2023-06-05 ENCOUNTER — Ambulatory Visit (INDEPENDENT_AMBULATORY_CARE_PROVIDER_SITE_OTHER): Payer: Self-pay

## 2023-06-05 DIAGNOSIS — J309 Allergic rhinitis, unspecified: Secondary | ICD-10-CM

## 2023-06-12 ENCOUNTER — Ambulatory Visit (INDEPENDENT_AMBULATORY_CARE_PROVIDER_SITE_OTHER): Payer: Self-pay

## 2023-06-12 DIAGNOSIS — J309 Allergic rhinitis, unspecified: Secondary | ICD-10-CM

## 2023-06-18 ENCOUNTER — Encounter: Payer: Self-pay | Admitting: Internal Medicine

## 2023-06-24 DIAGNOSIS — B372 Candidiasis of skin and nail: Secondary | ICD-10-CM | POA: Insufficient documentation

## 2023-07-02 ENCOUNTER — Ambulatory Visit: Admitting: Internal Medicine

## 2023-07-02 ENCOUNTER — Encounter: Payer: Self-pay | Admitting: Internal Medicine

## 2023-07-02 VITALS — BP 123/76 | HR 92 | Temp 98.1°F | Ht 64.0 in | Wt 215.2 lb

## 2023-07-02 DIAGNOSIS — K5900 Constipation, unspecified: Secondary | ICD-10-CM

## 2023-07-02 DIAGNOSIS — K219 Gastro-esophageal reflux disease without esophagitis: Secondary | ICD-10-CM | POA: Diagnosis not present

## 2023-07-02 DIAGNOSIS — Z860100 Personal history of colon polyps, unspecified: Secondary | ICD-10-CM

## 2023-07-02 NOTE — Patient Instructions (Signed)
 It was good to see you again today!  Continue Dexilant 60 mg daily best taken 30 minutes before breakfast  You should continue taking Amitiza twice daily  Continue stool softener  Continue Benefiber daily  Your bowel function as you described is within the normal range.  A future colonoscopy is not needed unless new symptoms develop  Office visit with [place] in 6 weeks

## 2023-07-02 NOTE — Progress Notes (Unsigned)
 Primary Care Physician:  Benita Stabile, MD Primary Gastroenterologist:  Dr. Jena Gauss  Pre-Procedure History & Physical: HPI:  Kendra Orozco is a 78 y.o. female here for follow-up of GERD and constipation.  Reflux well-controlled on Dexilant 60 mg daily.  No dysphagia.  History of colonic polyps; she is up-to-date and aged out of further surveillance.  Bowel function is good on stool softener, Amitiza and Benefiber.  Reports 1-2 formed stools daily.  No bleeding.  Past Medical History:  Diagnosis Date   Angio-edema    Anxiety    Arthritis    Asthma    Cancer (HCC)    skin and colon   GERD (gastroesophageal reflux disease)    Gout    Hypertension    Urticaria     Past Surgical History:  Procedure Laterality Date   ABDOMINAL HYSTERECTOMY     fibroid tumors   APPENDECTOMY     BACK SURGERY     BIOPSY  01/08/2022   Procedure: BIOPSY;  Surgeon: Corbin Ade, MD;  [facility]: [facility];  Service: Endoscopy;;   BREAST CYST EXCISION Right    CATARACT EXTRACTION W/PHACO Right 11/20/2019   Procedure: CATARACT EXTRACTION PHACO AND INTRAOCULAR LENS PLACEMENT (IOC) RIGHT EYE;  Surgeon: Fabio Pierce, MD;  Location: [facility];  Service: Ophthalmology;  Laterality: Right;  CDE: 17.30   CATARACT EXTRACTION W/PHACO Left 12/04/2019   Procedure: CATARACT EXTRACTION PHACO AND INTRAOCULAR LENS PLACEMENT LEFT EYE;  Surgeon: Fabio Pierce, MD;  Location: [facility];  Service: Ophthalmology;  Laterality: Left;  CDE: 14.59   COLONOSCOPY  09/16/2007   BMW:UXLK canal hemorrhoids, otherwise normal rectum left-sided diverticula and colonic mucosa appeared normal.   COLONOSCOPY N/A 03/02/2013   Dr. Allayne Butcher adenoma. Colonic diverticulosis. Anal canal hemorrhoids-likely source of hematochezia. Surveillance 2019   COLONOSCOPY WITH PROPOFOL N/A 07/29/2017   Non-bleeding internal hemorrhoids, cecal AVMs.    COLONOSCOPY WITH PROPOFOL N/A 03/23/2022   Procedure: COLONOSCOPY WITH PROPOFOL;  Surgeon: Corbin Ade, MD;  [facility]: [facility];  Service: Endoscopy;  Laterality: N/A;  9:00 am   ESOPHAGOGASTRODUODENOSCOPY  09/16/2007   GMW:[id number] undulating Z-line, tiny distal esophageal  erosions consistent with mild erosive reflux esophagitis, patulous EG junction, small hiatal hernia, otherwise normal stomach, D1, and D2.   ESOPHAGOGASTRODUODENOSCOPY (EGD) WITH PROPOFOL N/A 01/08/2022   Procedure: ESOPHAGOGASTRODUODENOSCOPY (EGD) WITH PROPOFOL;  Surgeon: Corbin Ade, MD;  [facility];  Service: Endoscopy;  Laterality: N/A;  2:30 pm   HEMORRHOID SURGERY     X 2    MALONEY DILATION N/A 01/08/2022   Procedure: MALONEY DILATION;  Surgeon: Corbin Ade, MD;  [facility];  Service: Endoscopy;  Laterality: N/A;   POLYPECTOMY  03/23/2022   Procedure: POLYPECTOMY;  Surgeon: Corbin Ade, MD;  [facility];  Service: Endoscopy;;    Prior to Admission medications   Medication Sig Start Date End Date Taking? Authorizing Provider  albuterol (VENTOLIN HFA) 108 (90 Base) MCG/ACT inhaler INHALE 2 PUFFS INTO THE LUNGS EVERY 4 HOURS AS NEEDED. 05/03/23  Yes Alfonse Spruce, MD  allopurinol (ZYLOPRIM) 100 MG tablet Take 100 mg by mouth daily.   Yes [provider]  budesonide-formoterol (SYMBICORT) 160-4.5 MCG/ACT inhaler Inhale 2 puffs into the lungs 2 (two) times daily. 08/24/22  Yes Alfonse Spruce, MD  calcium carbonate (OS-CAL - DOSED IN MG OF ELEMENTAL CALCIUM) 1250 (500 Ca) MG tablet Chew 1 tablet by mouth 2 (two) times a  week.    Yes [provider]  dexlansoprazole (DEXILANT) 60 MG capsule TAKE ONE CAPSULE BY MOUTH EVERY DAY 12/06/22  Yes Terry Bolotin, Gerrit Friends, MD  docusate sodium (COLACE) 250 MG capsule Take 250 mg by mouth daily.   Yes [provider]  EPINEPHrine (EPIPEN 2-PAK) 0.3 mg/0.3 mL IJ SOAJ injection Inject 0.3 mg into the muscle as needed for anaphylaxis. 09/24/22  Yes Alfonse Spruce, MD  Fluticasone  Propionate [facility] NA) Place into the nose.   Yes [provider]  hydrocortisone 2.5 % cream Apply 2.5 Applications topically daily as needed (hemorrhoids). 11/08/21  Yes [provider]  ipratropium (ATROVENT) 0.06 % nasal spray Place 2 sprays into both nostrils 2 (two) times daily. 05/10/23  Yes Alfonse Spruce, MD  ketoconazole (NIZORAL) 2 % cream Apply topically daily. 06/24/23  Yes [provider]  levocetirizine (XYZAL) 5 MG tablet Take 5 mg by mouth at bedtime.   Yes [provider]  LORazepam (ATIVAN) 1 MG tablet Take 0.5 mg by mouth 2 (two) times daily.   Yes [provider]  lubiprostone (AMITIZA) 24 MCG capsule Take 1 capsule (24 mcg total) by mouth 2 (two) times daily with a meal. 05/01/23  Yes Dorrien Grunder, Gerrit Friends, MD  Menthol-Methyl Salicylate (MUSCLE RUB) 10-15 % CREA Apply 1 application  topically daily as needed for muscle pain.   Yes [provider]  Multiple Vitamin (MULTIVITAMIN WITH MINERALS) TABS tablet Take 1 tablet by mouth daily.   Yes [provider]  olmesartan (BENICAR) 40 MG tablet Take 40 mg by mouth daily.   Yes [provider]  Polyvinyl Alcohol-Povidone (REFRESH OP) Place 1 drop into both eyes daily as needed (dry eyes).   Yes [provider]  potassium chloride SA (KLOR-CON M) 20 MEQ tablet Take 10 mEq by mouth daily.   Yes [provider]  Spacer/Aero-Holding Chambers DEVI 1 Device by Does not apply route as directed. 05/25/22  Yes Alfonse Spruce, MD  torsemide (DEMADEX) 20 MG tablet Take 10 mg by mouth daily. 07/22/20  Yes [provider]  triamcinolone (KENALOG) 0.1 % Apply 1 Application topically daily at 4 PM. 03/23/20  Yes [provider]  Wheat Dextrin (BENEFIBER) POWD Take 1 Package by mouth 2 (two) times daily.   Yes [provider]    Allergies as of 07/02/2023 - Review Complete 07/02/2023  Allergen Reaction Noted   Losartan  07/02/2023    Pantoprazole sodium  07/18/2017   Plastibase  01/04/2022   Latex Rash 02/16/2013   Sulfa antibiotics Swelling 02/11/2013    Family History  Problem Relation Age of Onset   Colon cancer Brother        1, deceased   Breast cancer Sister 27   Breast cancer Maternal Aunt 28   Heart attack Mother    Allergic rhinitis Mother    Allergic rhinitis Father     Social History   Socioeconomic History   Marital status: Divorced    Spouse name: Not on file   Number of children: Not on file   Years of education: Not on file   Highest education level: Not on file  Occupational History   Not on file  Tobacco Use   Smoking status: Former    Current packs/day: 0.25    Average packs/day: 0.3 packs/day for 3.0 years (0.8 ttl pk-yrs)    Types: Cigarettes   Smokeless tobacco: Never  Vaping Use   Vaping status: Never Used  Substance  and Sexual Activity   Alcohol use: Yes    Comment: occ   Drug use: No   Sexual activity: Not Currently    Birth control/protection: Surgical  Other Topics Concern   Not on file  Social History Narrative   Not on file   Social Drivers of Health   Financial Resource Strain: Not on file  Food Insecurity: Not on file  Transportation Needs: Not on file  Physical Activity: Not on file  Stress: Not on file  Social Connections: Not on file  Intimate Partner Violence: Not on file    Review of Systems: See HPI, otherwise negative ROS  Physical Exam: BP 123/76 (BP Location: Left Arm, Patient Position: Sitting, Cuff Size: Large)   Pulse 92   Temp 98.1 F (36.7 C) (Oral)   Ht 5\' 4"  (1.626 m)   Wt 215 lb 3.2 oz (97.6 kg)   SpO2 97%   BMI 36.94 kg/m  General:   Alert,  Well-developed, well-nourished, pleasant and cooperative in NAD Skin:  Intact without significant lesions or rashes. Eyes:  Sclera clear, no icterus.   Conjunctiva pink. Ears:  Normal auditory acuity. Nose:  No deformity, discharge,  or lesions. Mouth:  No deformity or  lesions. Neck:  Supple; no masses or thyromegaly. No significant cervical adenopathy. Lungs:  Clear throughout to auscultation.   No wheezes, crackles, or rhonchi. No acute distress. Heart:  Regular rate and rhythm; no murmurs, clicks, rubs,  or gallops. Abdomen: Non-distended, normal bowel sounds.  Soft and nontender without appreciable mass or hepatosplenomegaly.  Pulses:  Normal pulses noted. Extremities:  Without clubbing or edema.  Impression/Plan: 78 year old lady with well-controlled GERD prior Maloney dilation long-lasting no recurrent dysphagia distant history of colonic polyps she is up-to-date and aged out of future surveillance.  Bowel function is now within the normal range with the help of Amitiza stool softener and Benefiber daily.  Overall doing well from a GI standpoint.  Recommendations:  Continue Dexilant 60 mg daily best taken 30 minutes before breakfast  You should continue taking Amitiza twice daily  Continue stool softener  Continue Benefiber daily  Your bowel function as you described is within the normal range.  A future colonoscopy is not needed unless new symptoms develop  Office visit with [place] in 6 weeks  Notice: This dictation was prepared with Dragon dictation along with smaller phrase technology. Any transcriptional errors that result from this process are unintentional and may not be corrected upon review.

## 2023-07-03 ENCOUNTER — Telehealth (INDEPENDENT_AMBULATORY_CARE_PROVIDER_SITE_OTHER): Payer: Self-pay | Admitting: Otolaryngology

## 2023-07-03 ENCOUNTER — Ambulatory Visit (INDEPENDENT_AMBULATORY_CARE_PROVIDER_SITE_OTHER): Payer: PPO | Admitting: Allergy & Immunology

## 2023-07-03 ENCOUNTER — Encounter: Payer: Self-pay | Admitting: Allergy & Immunology

## 2023-07-03 VITALS — BP 102/70 | HR 108 | Temp 98.1°F | Resp 18 | Ht 62.4 in | Wt 213.0 lb

## 2023-07-03 DIAGNOSIS — J3089 Other allergic rhinitis: Secondary | ICD-10-CM

## 2023-07-03 DIAGNOSIS — J4540 Moderate persistent asthma, uncomplicated: Secondary | ICD-10-CM | POA: Diagnosis not present

## 2023-07-03 DIAGNOSIS — K219 Gastro-esophageal reflux disease without esophagitis: Secondary | ICD-10-CM

## 2023-07-03 DIAGNOSIS — J302 Other seasonal allergic rhinitis: Secondary | ICD-10-CM | POA: Diagnosis not present

## 2023-07-03 MED ORDER — BUDESONIDE-FORMOTEROL FUMARATE 160-4.5 MCG/ACT IN AERO: 2.0000 | INHALATION_SPRAY | Freq: Two times a day (BID) | RESPIRATORY_TRACT | 3 refills | Status: AC

## 2023-07-03 MED ORDER — BENZONATATE 100 MG PO CAPS: 100.0000 mg | ORAL_CAPSULE | Freq: Three times a day (TID) | ORAL | 2 refills | Status: AC | PRN

## 2023-07-03 NOTE — Progress Notes (Unsigned)
 FOLLOW UP  Date of Service/Encounter:  07/03/23   Assessment:   Moderate persistent asthma, uncomplicated   Perennial and seasonal allergic rhinitis (grass, molds, dust mites, cats, and dog) - interested in starting allergen immunotherapy   Food intolerance   Rash - resolved   GERD with multiple gastric polyps - on Dexilant 60 m  Plan/Recommendations:   1. Moderate persistent asthma, uncomplicated - Lung testing looks excellent today. - I do not think that we need to make any changes at that time. - I think that the coughing is definitely more from the drainage.  - Daily controller medication(s): Symbicort up to two puffs twice daily with spacer. - Prior to physical activity: albuterol 2 puffs 10-15 minutes before physical activity. - Rescue medications: albuterol 4 puffs every 4-6 hours as needed - Asthma control goals:  * Full participation in all desired activities (may need albuterol before activity) * Albuterol use two time or less a week on average (not counting use with activity) * Cough interfering with sleep two time or less a month * Oral steroids no more than once a year * No hospitalizations  2. Chronic rhinitis - Previous testing was reactive to grass, molds, dust mites, cats, and dog. - You have reached the Red Vial, so hopefully next year will be much better symptom wise. - Continue with: ipratropium one spray per nostril twice daily as needed like you are doing  3. Concern for obstructive sleep apnea - We can order a home sleep study if you are interested. - But definitely talk to Dr. Margo Aye first.   4. Return in about 6 months (around 01/03/2024). You can have the follow up appointment with Dr. Dellis Anes or a Nurse Practicioner (our Nurse Practitioners are excellent and always have Physician oversight!).   Subjective:   Kendra Orozco is a 78 y.o. female presenting today for follow up of  Chief Complaint  Patient presents with   Follow-up    Has  been coughing at night. Tessalon pearles help with the cough.     Kendra Orozco has a history of the following: Patient Active Problem List   Diagnosis Date Noted   Tinnitus of both ears 01/30/2023   Chronic rhinitis 01/30/2023   Deviated nasal septum 01/30/2023   Hypertrophy of nasal turbinates 01/30/2023   IDA (iron deficiency anemia) 02/20/2022   Elevated alkaline phosphatase level 02/20/2022   Dysphagia 12/14/2021   Hyponatremia 06/06/2020   COVID-19 virus infection 06/06/2020   HTN (hypertension) 06/06/2020   Prolapsed internal hemorrhoids, grade 3 05/12/2020   Constipation 04/15/2013   Rectal bleeding 02/11/2013   Gastroesophageal reflux disease 02/11/2013    History obtained from: chart review and patient.  Discussed the use of AI scribe software for clinical note transcription with the patient and/or guardian, who gave verbal consent to proceed.  Kendra Orozco is a 78 y.o. female presenting for a follow up visit. Kendra Orozco was last seen in November 2024. At that time, lung testing was not done since it was a telelvisit. We continued with Symbicort 2 puffs twice daily as well as albuterol as needed.  For her rhinitis, Kendra Orozco had testing that was positive to grasses, molds, dust mites, cat, and dog.  Kendra Orozco reached her Red Vial, so we hopes that this would help control her symptoms better.  We continue that ipratropium 1 spray per nostril twice daily.  Since last visit, Kendra Orozco has done very well.   Asthma/Respiratory Symptom History: Kendra Orozco continues to use Symbicort for her  breathing issues, which helps, although Kendra Orozco experiences coughing attributed to nasal drainage. Kendra Orozco uses a nasal spray twice daily, which effectively reduces symptoms. Kendra Orozco thinks that her allergies and her asthma are deeply interconnected.   Kendra Orozco mentions having had COVID twice, with the first instance being severe and resulting in a loss of potassium, necessitating daily potassium supplements. Kendra Orozco also takes a low dose of steroids,  which Kendra Orozco finds less effective than the allergy shots.  Allergic Rhinitis Symptom History: Kendra Orozco reports that her allergy shots are helping with her respiratory issues, although Kendra Orozco recently experienced an infection that caused some confusion and tenderness. Kendra Orozco is currently feeling better but still experiences some tenderness. Kendra Orozco has difficulty breathing at times, but the allergy shots have significantly reduced her respiratory infections.  Skin Symptom History: Kendra Orozco has a history of skin issues related to steroid use, noting that inhaled steroids cause her blood vessels to break, resulting in red spots. Kendra Orozco sees a dermatologist for skin cancer treatment, which involves burning off affected areas, and visits once or twice a year.  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   Otherwise, there have been no changes to her past medical history, surgical history, family history, or social history.    Review of systems otherwise negative other than that mentioned in the HPI.    Objective:   Blood pressure 102/70, pulse (!) 108, temperature 98.1 F (36.7 C), resp. rate 18, height 5' 2.4" (1.585 m), weight 213 lb (96.6 kg), SpO2 95%. Body mass index is 38.46 kg/m.    Physical Exam Vitals reviewed.  Constitutional:      Appearance: Kendra Orozco is well-developed.     Comments: Smiling and pleasant. Talkative.  HENT:     Head: Normocephalic and atraumatic.     Right Ear: Tympanic membrane, ear canal and external ear normal.     Left Ear: Tympanic membrane, ear canal and external ear normal.     Nose: Mucosal edema and rhinorrhea present. No nasal deformity or septal deviation.     Right Turbinates: Enlarged and swollen. Not pale.     Left Turbinates: Enlarged and swollen. Not pale.     Right Sinus: No maxillary sinus tenderness or frontal sinus tenderness.     Left Sinus: No maxillary sinus tenderness or frontal sinus tenderness.      Comments: No nasal polyps.  Clear rhinorrhea.    Mouth/Throat:     Lips: Pink.     Mouth: Mucous membranes are moist. Mucous membranes are not pale and not dry.     Pharynx: Uvula midline.     Comments: Moderate cobblestoning. Eyes:     General:        Right eye: No discharge.        Left eye: No discharge.     Conjunctiva/sclera: Conjunctivae normal.     Right eye: Right conjunctiva is not injected. No chemosis.    Left eye: Left conjunctiva is not injected. No chemosis.    Pupils: Pupils are equal, round, and reactive to light.  Cardiovascular:     Rate and Rhythm: Normal rate and regular rhythm.     Heart sounds: Normal heart sounds.  Pulmonary:     Effort: Pulmonary effort is normal. No tachypnea, accessory muscle usage or respiratory distress.     Breath sounds: No decreased breath sounds, wheezing, rhonchi or rales.     Comments: Moving air well in all lung  fields. No increased work of breathing noted.   Chest:     Chest wall: No tenderness.  Lymphadenopathy:     Cervical: No cervical adenopathy.  Skin:    General: Skin is warm.     Capillary Refill: Capillary refill takes less than 2 seconds.     Coloration: Skin is not pale.     Findings: No abrasion, erythema, petechiae or rash. Rash is not papular, urticarial or vesicular.     Comments: No eczematous or urticarial lesions noted.   Neurological:     Mental Status: Kendra Orozco is alert.  Psychiatric:        Behavior: Behavior is cooperative.      Diagnostic studies:    Spirometry: results normal (FEV1: 1.50/81%, FVC: 2.16/89%, FEV1/FVC: 69%).    Spirometry consistent with normal pattern.    Allergy Studies: none       Malachi Bonds, MD  [facility]

## 2023-07-03 NOTE — Telephone Encounter (Signed)
 Left voicemail to reschedule follow up . Dr Suszanne Conners out of town 4/15

## 2023-07-03 NOTE — Patient Instructions (Addendum)
 1. Moderate persistent asthma, uncomplicated - Lung testing looks excellent today. - I do not think that we need to make any changes at that time. - I think that the coughing is definitely more from the drainage.  - Daily controller medication(s): Symbicort up to two puffs twice daily with spacer. - Prior to physical activity: albuterol 2 puffs 10-15 minutes before physical activity. - Rescue medications: albuterol 4 puffs every 4-6 hours as needed - Asthma control goals:  * Full participation in all desired activities (may need albuterol before activity) * Albuterol use two time or less a week on average (not counting use with activity) * Cough interfering with sleep two time or less a month * Oral steroids no more than once a year * No hospitalizations  2. Chronic rhinitis - Previous testing was reactive to grass, molds, dust mites, cats, and dog. - You have reached the Red Vial, so hopefully next year will be much better symptom wise. - Continue with: ipratropium one spray per nostril twice daily as needed like you are doing  3. Concern for obstructive sleep apnea - We can order a home sleep study if you are interested. - But definitely talk to Dr. Margo Aye first.   4. Return in about 6 months (around 01/03/2024). You can have the follow up appointment with Dr. Dellis Anes or a Nurse Practicioner (our Nurse Practitioners are excellent and always have Physician oversight!).    Please inform us of any Emergency Department visits, hospitalizations, or changes in symptoms. Call us before going to the ED for breathing or allergy symptoms since we might be able to fit you in for a sick visit. Feel free to contact us anytime with any questions, problems, or concerns.  It was a pleasure to see you again today!  Websites that have reliable patient information: 1. American Academy of Asthma, Allergy, and Immunology: www.aaaai.org 2. Food Allergy Research and Education (FARE): foodallergy.org 3.  Mothers of Asthmatics: http://www.asthmacommunitynetwork.org 4. American College of Allergy, Asthma, and Immunology: www.acaai.org      "Like" [place] on Facebook and Instagram for our latest updates!      A healthy democracy works best when [employer] participate! Make sure you are registered to vote! If you have moved or changed any of your contact information, you will need to get this updated before voting! Scan the QR codes below to learn more!

## 2023-07-05 ENCOUNTER — Encounter: Payer: Self-pay | Admitting: Allergy & Immunology

## 2023-07-12 ENCOUNTER — Ambulatory Visit (INDEPENDENT_AMBULATORY_CARE_PROVIDER_SITE_OTHER): Payer: Self-pay

## 2023-07-12 DIAGNOSIS — J309 Allergic rhinitis, unspecified: Secondary | ICD-10-CM | POA: Diagnosis not present

## 2023-07-19 ENCOUNTER — Ambulatory Visit (INDEPENDENT_AMBULATORY_CARE_PROVIDER_SITE_OTHER)

## 2023-07-19 DIAGNOSIS — J309 Allergic rhinitis, unspecified: Secondary | ICD-10-CM

## 2023-07-26 ENCOUNTER — Ambulatory Visit (INDEPENDENT_AMBULATORY_CARE_PROVIDER_SITE_OTHER)

## 2023-07-26 DIAGNOSIS — J309 Allergic rhinitis, unspecified: Secondary | ICD-10-CM | POA: Diagnosis not present

## 2023-07-30 ENCOUNTER — Ambulatory Visit (INDEPENDENT_AMBULATORY_CARE_PROVIDER_SITE_OTHER): Payer: PPO

## 2023-07-30 DIAGNOSIS — L570 Actinic keratosis: Secondary | ICD-10-CM | POA: Diagnosis not present

## 2023-08-15 DIAGNOSIS — R7303 Prediabetes: Secondary | ICD-10-CM | POA: Diagnosis not present

## 2023-08-15 DIAGNOSIS — D509 Iron deficiency anemia, unspecified: Secondary | ICD-10-CM | POA: Diagnosis not present

## 2023-08-15 DIAGNOSIS — E782 Mixed hyperlipidemia: Secondary | ICD-10-CM | POA: Diagnosis not present

## 2023-08-15 DIAGNOSIS — E790 Hyperuricemia without signs of inflammatory arthritis and tophaceous disease: Secondary | ICD-10-CM | POA: Diagnosis not present

## 2023-08-16 ENCOUNTER — Ambulatory Visit (INDEPENDENT_AMBULATORY_CARE_PROVIDER_SITE_OTHER): Payer: Self-pay

## 2023-08-16 DIAGNOSIS — J309 Allergic rhinitis, unspecified: Secondary | ICD-10-CM

## 2023-08-16 MED ORDER — CETIRIZINE HCL 10 MG PO TABS: 10.0000 mg | ORAL_TABLET | Freq: Every day | ORAL | 1 refills | Status: AC | PRN

## 2023-08-22 DIAGNOSIS — M545 Low back pain, unspecified: Secondary | ICD-10-CM | POA: Diagnosis not present

## 2023-08-22 DIAGNOSIS — J4530 Mild persistent asthma, uncomplicated: Secondary | ICD-10-CM | POA: Diagnosis not present

## 2023-08-22 DIAGNOSIS — M25551 Pain in right hip: Secondary | ICD-10-CM | POA: Insufficient documentation

## 2023-08-22 DIAGNOSIS — E790 Hyperuricemia without signs of inflammatory arthritis and tophaceous disease: Secondary | ICD-10-CM | POA: Diagnosis not present

## 2023-08-22 DIAGNOSIS — I10 Essential (primary) hypertension: Secondary | ICD-10-CM | POA: Diagnosis not present

## 2023-08-22 DIAGNOSIS — E782 Mixed hyperlipidemia: Secondary | ICD-10-CM | POA: Diagnosis not present

## 2023-08-22 DIAGNOSIS — Z0001 Encounter for general adult medical examination with abnormal findings: Secondary | ICD-10-CM | POA: Diagnosis not present

## 2023-08-22 DIAGNOSIS — K644 Residual hemorrhoidal skin tags: Secondary | ICD-10-CM | POA: Diagnosis not present

## 2023-08-22 DIAGNOSIS — R609 Edema, unspecified: Secondary | ICD-10-CM | POA: Diagnosis not present

## 2023-08-22 DIAGNOSIS — F411 Generalized anxiety disorder: Secondary | ICD-10-CM | POA: Diagnosis not present

## 2023-08-22 DIAGNOSIS — M159 Polyosteoarthritis, unspecified: Secondary | ICD-10-CM | POA: Diagnosis not present

## 2023-08-22 DIAGNOSIS — K219 Gastro-esophageal reflux disease without esophagitis: Secondary | ICD-10-CM | POA: Diagnosis not present

## 2023-08-22 DIAGNOSIS — K581 Irritable bowel syndrome with constipation: Secondary | ICD-10-CM | POA: Diagnosis not present

## 2023-08-23 ENCOUNTER — Ambulatory Visit (INDEPENDENT_AMBULATORY_CARE_PROVIDER_SITE_OTHER): Payer: Self-pay

## 2023-08-23 DIAGNOSIS — J309 Allergic rhinitis, unspecified: Secondary | ICD-10-CM

## 2023-09-02 ENCOUNTER — Ambulatory Visit (INDEPENDENT_AMBULATORY_CARE_PROVIDER_SITE_OTHER): Payer: Self-pay

## 2023-09-02 DIAGNOSIS — J309 Allergic rhinitis, unspecified: Secondary | ICD-10-CM

## 2023-09-13 ENCOUNTER — Ambulatory Visit (INDEPENDENT_AMBULATORY_CARE_PROVIDER_SITE_OTHER): Payer: Self-pay

## 2023-09-13 DIAGNOSIS — J309 Allergic rhinitis, unspecified: Secondary | ICD-10-CM | POA: Diagnosis not present

## 2023-09-17 DIAGNOSIS — E669 Obesity, unspecified: Secondary | ICD-10-CM | POA: Diagnosis not present

## 2023-09-17 DIAGNOSIS — M25562 Pain in left knee: Secondary | ICD-10-CM | POA: Diagnosis not present

## 2023-09-17 DIAGNOSIS — M1A09X0 Idiopathic chronic gout, multiple sites, without tophus (tophi): Secondary | ICD-10-CM | POA: Diagnosis not present

## 2023-09-17 DIAGNOSIS — M797 Fibromyalgia: Secondary | ICD-10-CM | POA: Diagnosis not present

## 2023-09-17 DIAGNOSIS — M25561 Pain in right knee: Secondary | ICD-10-CM | POA: Diagnosis not present

## 2023-09-17 DIAGNOSIS — R5382 Chronic fatigue, unspecified: Secondary | ICD-10-CM | POA: Diagnosis not present

## 2023-09-17 DIAGNOSIS — Z6837 Body mass index (BMI) 37.0-37.9, adult: Secondary | ICD-10-CM | POA: Diagnosis not present

## 2023-09-18 ENCOUNTER — Ambulatory Visit (INDEPENDENT_AMBULATORY_CARE_PROVIDER_SITE_OTHER): Admitting: Otolaryngology

## 2023-09-18 ENCOUNTER — Encounter (INDEPENDENT_AMBULATORY_CARE_PROVIDER_SITE_OTHER): Payer: Self-pay | Admitting: Otolaryngology

## 2023-09-18 VITALS — BP 121/79 | HR 88 | Ht 63.0 in | Wt 215.0 lb

## 2023-09-18 DIAGNOSIS — H9313 Tinnitus, bilateral: Secondary | ICD-10-CM

## 2023-09-18 DIAGNOSIS — H905 Unspecified sensorineural hearing loss: Secondary | ICD-10-CM | POA: Diagnosis not present

## 2023-09-18 DIAGNOSIS — J342 Deviated nasal septum: Secondary | ICD-10-CM | POA: Diagnosis not present

## 2023-09-18 DIAGNOSIS — H6123 Impacted cerumen, bilateral: Secondary | ICD-10-CM

## 2023-09-18 DIAGNOSIS — J343 Hypertrophy of nasal turbinates: Secondary | ICD-10-CM

## 2023-09-18 DIAGNOSIS — J0101 Acute recurrent maxillary sinusitis: Secondary | ICD-10-CM

## 2023-09-18 DIAGNOSIS — J329 Chronic sinusitis, unspecified: Secondary | ICD-10-CM | POA: Diagnosis not present

## 2023-09-18 DIAGNOSIS — H903 Sensorineural hearing loss, bilateral: Secondary | ICD-10-CM

## 2023-09-18 DIAGNOSIS — J302 Other seasonal allergic rhinitis: Secondary | ICD-10-CM | POA: Diagnosis not present

## 2023-09-18 MED ORDER — AZITHROMYCIN 250 MG PO TABS: ORAL_TABLET | ORAL | 0 refills | Status: AC

## 2023-09-18 NOTE — Progress Notes (Signed)
 Patient ID: Kendra Orozco, female   DOB: 05/13/1945, 77 y.o.   MRN: [id number]  Follow-up: Chronic nasal congestion, hearing loss, tinnitus  HPI: The patient is a 78 year old female who returns today for her follow-up evaluation.  The patient has a history of chronic nasal congestion, hearing loss, and tinnitus.  At her last visit in October 2024, she was noted to have nasal mucosal congestion, nasal septal deviation, and bilateral inferior turbinate hypertrophy.  She was continued on her Flonase nasal spray, immunotherapy, and nasal saline irrigation.  The strategies to cope with tinnitus were also discussed.  The patient returns today complaining of persistent bilateral tinnitus.  Her nasal congestion has worsened during the allergy  season.  She is currently on immunotherapy every 2 weeks.  Despite the treatment, she has noted recurrent facial pain and pressure.  She denies any recent change in her hearing.  Currently she denies any otalgia, otorrhea, vertigo, fever, or visual change.  Exam: General: Communicates without difficulty, well nourished, no acute distress. Head: Normocephalic, no evidence injury, no tenderness, facial buttresses intact without stepoff. Face/sinus: No tenderness to palpation and percussion. Facial movement is normal and symmetric. Eyes: PERRL, EOMI. No scleral icterus, conjunctivae clear. Neuro: CN II exam reveals vision grossly intact.  No nystagmus at any point of gaze. Ears: Auricles well formed without lesions.  Bilateral cerumen impaction.  Nose: External evaluation reveals normal support and skin without lesions.  Dorsum is intact.  Anterior rhinoscopy reveals edematous and erythematous nasal mucosa over anterior aspect of inferior turbinates and intact septum. Oral:  Oral cavity and oropharynx are intact, symmetric, without erythema or edema.  Mucosa is moist without lesions. Neck: Full range of motion without pain.  There is no significant lymphadenopathy.  No masses  palpable.  Thyroid bed within normal limits to palpation.  Parotid glands and submandibular glands equal bilaterally without mass.  Trachea is midline. Neuro:  CN 2-12 grossly intact.   Procedure: Bilateral cerumen disimpaction Anesthesia: None Description: Under the operating microscope, the cerumen is carefully removed with a combination of cerumen currette, alligator forceps, and suction catheters.  After the cerumen is removed, the TMs are noted to be normal.  No mass, erythema, or lesions. The patient tolerated the procedure well.    Assessment: 1.  Incidental finding of bilateral cerumen impaction.  After the disimpaction procedure, both tympanic membranes and middle ear spaces are noted to be normal. 2.  Recurrent sinusitis, with erythematous and edematous nasal mucosa. 3.  Nasal septal deviation and bilateral inferior turbinate hypertrophy. 4.  Subjectively stable bilateral tinnitus and high-frequency sensorineural hearing loss.  Plan: 1.  Otomicroscopy with bilateral cerumen disimpaction. 2.  The physical exam findings are reviewed with the patient. 3.  Continue with immunotherapy, Flonase nasal spray, and nasal saline irrigation. 4.  Azithromycin  for 5 days. 5.  Continue the use of her hearing aids. 6.  The strategies to cope with tinnitus are also discussed. 7.  The patient will return for reevaluation in 6 months.

## 2023-09-18 NOTE — Progress Notes (Signed)
 VIALS MADE 09-18-23

## 2023-09-19 DIAGNOSIS — J3089 Other allergic rhinitis: Secondary | ICD-10-CM | POA: Diagnosis not present

## 2023-09-21 DIAGNOSIS — H903 Sensorineural hearing loss, bilateral: Secondary | ICD-10-CM | POA: Insufficient documentation

## 2023-09-21 DIAGNOSIS — J0101 Acute recurrent maxillary sinusitis: Secondary | ICD-10-CM | POA: Insufficient documentation

## 2023-09-21 DIAGNOSIS — H6123 Impacted cerumen, bilateral: Secondary | ICD-10-CM | POA: Insufficient documentation

## 2023-10-10 ENCOUNTER — Ambulatory Visit: Admitting: Orthopedic Surgery

## 2023-10-10 ENCOUNTER — Encounter: Payer: Self-pay | Admitting: Orthopedic Surgery

## 2023-10-10 ENCOUNTER — Other Ambulatory Visit (INDEPENDENT_AMBULATORY_CARE_PROVIDER_SITE_OTHER): Payer: Self-pay

## 2023-10-10 VITALS — BP 120/86 | HR 102 | Ht 63.0 in | Wt 213.0 lb

## 2023-10-10 DIAGNOSIS — M1A9XX0 Chronic gout, unspecified, without tophus (tophi): Secondary | ICD-10-CM | POA: Insufficient documentation

## 2023-10-10 DIAGNOSIS — G8929 Other chronic pain: Secondary | ICD-10-CM

## 2023-10-10 DIAGNOSIS — M797 Fibromyalgia: Secondary | ICD-10-CM | POA: Insufficient documentation

## 2023-10-10 DIAGNOSIS — M545 Low back pain, unspecified: Secondary | ICD-10-CM

## 2023-10-10 DIAGNOSIS — R5382 Chronic fatigue, unspecified: Secondary | ICD-10-CM | POA: Insufficient documentation

## 2023-10-10 DIAGNOSIS — R209 Unspecified disturbances of skin sensation: Secondary | ICD-10-CM | POA: Insufficient documentation

## 2023-10-10 NOTE — Patient Instructions (Addendum)
 For further problems lease schedule with our [place] office or we can send you back to Dr Leeann office if you need   Physical therapy has been ordered for you at [facility]. They should call you to schedule, [phone] is the phone number to call, if you want to call to schedule.

## 2023-10-10 NOTE — Progress Notes (Signed)
  Intake history:  BP 120/86   Pulse (!) 102   Ht 5' 3 (1.6 m)   Wt 213 lb (96.6 kg)   BMI 37.73 kg/m  Body mass index is 37.73 kg/m.    WHAT ARE WE SEEING YOU FOR TODAY?   back - lumbar/sacral  Radiation?: yes, goes into back of left leg to back of thigh states hard to pick up left leg    Loss of bowel/urine control?  No     How long has this bothered you? 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  No  Diabetes No  Heart disease No  Hypertension Yes  SMOKING HX No  Kidney disease Yes  Any ALLERGIES ______________________________________________   Treatment:  Have you taken:  Tylenol  Yes  Advil Yes  Had PT Yes 40 years ago  Had injection No  Other  _________________________

## 2023-10-10 NOTE — Progress Notes (Signed)
 Patient ID: JMYA ULIANO, female   DOB: 1945-06-15, 78 y.o.   MRN: [id number]  Office Visit Note   Patient: Kendra Orozco           Date of Birth: April 01, 1946           MRN: [id number] Visit Date: 10/10/2023 Requested by: Shona Norleen PEDLAR, MD [address] [place],  [place] [address] PCP: Shona Norleen PEDLAR, MD  Assessment & Plan:  Images personally read and my interpretation :    DG Lumbar Spine Complete Result Date: 10/10/2023 Lower back pain history of L4-5 surgery 5 views of the spine.  Coronal and sagittal plane abnormal alignment.  Facet arthritis grade 2 spondylo at L4-L5 or L5-S1, difficult to tell spot films suggested L5-S1 Impression grade 2 spondylolisthesis chronic degenerative changes in the facet joints spondylosis     Visit Diagnoses:  1. Chronic low back pain, unspecified back pain laterality, unspecified whether sciatica present     Plan: Physical therapy  No improvement patient should see orthospine or neurosurgery  Follow-Up Instructions: No follow-ups on file.   Orders:  No orders of the defined types were placed in this encounter.    Chief Complaint  Patient presents with   Back Pain    HPI Kendra Orozco is a 78 y.o. female.  Presents for evaluation of back pain bilateral leg pain left greater than right with a history of a prior L4-5 and S1 discectomy in 1994 by Dr. Leeann for paralysis of the right lower extremity.  She did well up into the month or so bilateral leg pain with weakness  Treatment note treatment has been instituted  Allergies  Allergen Reactions   Losartan     Other Reaction(s): Not available  losartan   Pantoprazole  Sodium     Constipation, GI pain   Plastibase     Other reaction(s): hives   Latex Rash   Sulfa Antibiotics Swelling   Current Outpatient Medications  Medication Instructions   albuterol  (VENTOLIN  HFA) 108 (90 Base) MCG/ACT inhaler INHALE 2 PUFFS INTO THE LUNGS EVERY 4 HOURS AS NEEDED.   allopurinol  (ZYLOPRIM) 100 mg, Daily   benzonatate  (TESSALON  PERLES) 100 mg, Oral, 3 times daily PRN   budesonide -formoterol  (SYMBICORT ) 160-4.5 MCG/ACT inhaler 2 puffs, Inhalation, 2 times daily   calcium carbonate (OS-CAL - DOSED IN MG OF ELEMENTAL CALCIUM) 1250 (500 Ca) MG tablet 1 tablet, 2 times weekly   cetirizine  (ZYRTEC ) 10 mg, Oral, Daily PRN   dexlansoprazole  (DEXILANT ) 60 mg, Oral, Daily   docusate sodium (COLACE) 250 mg, Daily   EPINEPHrine  (EPIPEN  2-PAK) 0.3 mg, Intramuscular, As needed   Fluticasone  Propionate (FLONASE NA) Place into the nose.   hydrocortisone  2.5 % cream 2.5 Applications, Daily PRN   ipratropium (ATROVENT ) 0.06 % nasal spray 2 sprays, Each Nare, 2 times daily   ketoconazole (NIZORAL) 2 % cream Daily   levocetirizine (XYZAL ) 5 mg, Daily at bedtime   LORazepam  (ATIVAN ) 0.5 mg, 2 times daily   lubiprostone  (AMITIZA ) 24 mcg, Oral, 2 times daily with meals   Menthol-Methyl Salicylate (MUSCLE RUB) 10-15 % CREA 1 application , Daily PRN   Multiple Vitamin (MULTIVITAMIN WITH MINERALS) TABS tablet 1 tablet, Daily   olmesartan (BENICAR) 40 mg, Daily   Polyvinyl Alcohol-Povidone (REFRESH OP) 1 drop, Daily PRN   potassium chloride  SA (KLOR-CON  M) 20 MEQ tablet 10 mEq, Daily   Spacer/Aero-Holding Chambers DEVI 1 Device, Does not apply, As directed   torsemide (DEMADEX) 10  mg, Daily   triamcinolone (KENALOG) 0.1 % 1 Application, Daily-1600   Wheat Dextrin (BENEFIBER) POWD 1 Package, 2 times daily    Review of Systems Review of Systems  Past Medical History:  Diagnosis Date   Angio-edema    Anxiety    Arthritis    Asthma    Cancer (HCC)    skin and colon   GERD (gastroesophageal reflux disease)    Gout    Hypertension    Urticaria     Past Surgical History:  Procedure Laterality Date   ABDOMINAL HYSTERECTOMY     fibroid tumors   APPENDECTOMY     BACK SURGERY     BIOPSY  01/08/2022   Procedure: BIOPSY;  Surgeon: Shaaron Lamar HERO, MD;  [facility];   Service: Endoscopy;;   BREAST CYST EXCISION Right    CATARACT EXTRACTION W/PHACO Right 11/20/2019   Procedure: CATARACT EXTRACTION PHACO AND INTRAOCULAR LENS PLACEMENT (IOC) RIGHT EYE;  Surgeon: Harrie Agent, MD;  Location: [facility];  Service: Ophthalmology;  Laterality: Right;  CDE: 17.30   CATARACT EXTRACTION W/PHACO Left 12/04/2019   Procedure: CATARACT EXTRACTION PHACO AND INTRAOCULAR LENS PLACEMENT LEFT EYE;  Surgeon: Harrie Agent, MD;  Location: [facility];  Service: Ophthalmology;  Laterality: Left;  CDE: 14.59   COLONOSCOPY  09/16/2007   MFM:Jwjo canal hemorrhoids, otherwise normal rectum left-sided diverticula and colonic mucosa appeared normal.   COLONOSCOPY N/A 03/02/2013   Dr. Rolfe adenoma. Colonic diverticulosis. Anal canal hemorrhoids-likely source of hematochezia. Surveillance 2019   COLONOSCOPY WITH PROPOFOL  N/A 07/29/2017   Non-bleeding internal hemorrhoids, cecal AVMs.    COLONOSCOPY WITH PROPOFOL  N/A 03/23/2022   Procedure: COLONOSCOPY WITH PROPOFOL ;  Surgeon: Shaaron Lamar HERO, MD;  [facility];  Service: Endoscopy;  Laterality: N/A;  9:00 am   ESOPHAGOGASTRODUODENOSCOPY  09/16/2007   MFM:Jrrzwuljuzi undulating Z-line, tiny distal esophageal  erosions consistent with mild erosive reflux esophagitis, patulous EG junction, small hiatal hernia, otherwise normal stomach, D1, and D2.   ESOPHAGOGASTRODUODENOSCOPY (EGD) WITH PROPOFOL  N/A 01/08/2022   Procedure: ESOPHAGOGASTRODUODENOSCOPY (EGD) WITH PROPOFOL ;  Surgeon: Shaaron Lamar HERO, MD;  [facility];  Service: Endoscopy;  Laterality: N/A;  2:30 pm   HEMORRHOID SURGERY     X 2    MALONEY DILATION N/A 01/08/2022   Procedure: MALONEY DILATION;  Surgeon: Shaaron Lamar HERO, MD;  [facility];  Service: Endoscopy;  Laterality: N/A;   POLYPECTOMY  03/23/2022   Procedure: POLYPECTOMY;  Surgeon: Shaaron Lamar HERO, MD;  [facility];  Service: Endoscopy;;    Family History  Problem Relation Age  of Onset   Colon cancer Brother        11, deceased   Breast cancer Sister 49   Breast cancer Maternal Aunt 28   Heart attack Mother    Allergic rhinitis Mother    Allergic rhinitis Father    was reviewed  Social History Social History   Tobacco Use   Smoking status: Former    Current packs/day: 0.25    Average packs/day: 0.3 packs/day for 3.0 years (0.8 ttl pk-yrs)    Types: Cigarettes   Smokeless tobacco: Never  Vaping Use   Vaping status: Never Used  Substance Use Topics   Alcohol use: Yes    Comment: occ   Drug use: No    Allergies  Allergen Reactions   Losartan     Other Reaction(s): Not available  losartan   Pantoprazole  Sodium     Constipation, GI  pain   Plastibase     Other reaction(s): hives   Latex Rash   Sulfa Antibiotics Swelling    Current Outpatient Medications  Medication Sig Dispense Refill   albuterol  (VENTOLIN  HFA) 108 (90 Base) MCG/ACT inhaler INHALE 2 PUFFS INTO THE LUNGS EVERY 4 HOURS AS NEEDED. 18 g 1   allopurinol (ZYLOPRIM) 100 MG tablet Take 100 mg by mouth daily.     benzonatate  (TESSALON  PERLES) 100 MG capsule Take 1 capsule (100 mg total) by mouth 3 (three) times daily as needed for cough. 20 capsule 2   budesonide -formoterol  (SYMBICORT ) 160-4.5 MCG/ACT inhaler Inhale 2 puffs into the lungs 2 (two) times daily. 11 g 3   calcium carbonate (OS-CAL - DOSED IN MG OF ELEMENTAL CALCIUM) 1250 (500 Ca) MG tablet Chew 1 tablet by mouth 2 (two) times a week.      cetirizine  (ZYRTEC ) 10 MG tablet Take 1 tablet (10 mg total) by mouth daily as needed (Can take an extra dose during flare). 180 tablet 1   dexlansoprazole  (DEXILANT ) 60 MG capsule TAKE ONE CAPSULE BY MOUTH EVERY DAY 90 capsule 3   docusate sodium (COLACE) 250 MG capsule Take 250 mg by mouth daily.     EPINEPHrine  (EPIPEN  2-PAK) 0.3 mg/0.3 mL IJ SOAJ injection Inject 0.3 mg into the muscle as needed for anaphylaxis. 2 each 1   Fluticasone  Propionate (FLONASE NA) Place into the nose.      hydrocortisone  2.5 % cream Apply 2.5 Applications topically daily as needed (hemorrhoids).     ipratropium (ATROVENT ) 0.06 % nasal spray Place 2 sprays into both nostrils 2 (two) times daily. 15 mL 5   ketoconazole (NIZORAL) 2 % cream Apply topically daily.     levocetirizine (XYZAL ) 5 MG tablet Take 5 mg by mouth at bedtime.     LORazepam  (ATIVAN ) 1 MG tablet Take 0.5 mg by mouth 2 (two) times daily.     lubiprostone  (AMITIZA ) 24 MCG capsule Take 1 capsule (24 mcg total) by mouth 2 (two) times daily with a meal. 60 capsule 11   Menthol-Methyl Salicylate (MUSCLE RUB) 10-15 % CREA Apply 1 application  topically daily as needed for muscle pain.     Multiple Vitamin (MULTIVITAMIN WITH MINERALS) TABS tablet Take 1 tablet by mouth daily.     olmesartan (BENICAR) 40 MG tablet Take 40 mg by mouth daily.     Polyvinyl Alcohol-Povidone (REFRESH OP) Place 1 drop into both eyes daily as needed (dry eyes).     potassium chloride  SA (KLOR-CON  M) 20 MEQ tablet Take 10 mEq by mouth daily.     Spacer/Aero-Holding Chambers DEVI 1 Device by Does not apply route as directed. 1 each 1   torsemide (DEMADEX) 20 MG tablet Take 10 mg by mouth daily.     triamcinolone (KENALOG) 0.1 % Apply 1 Application topically daily at 4 PM.     Wheat Dextrin (BENEFIBER) POWD Take 1 Package by mouth 2 (two) times daily.     No current facility-administered medications for this visit.     Physical Exam BP 120/86   Pulse (!) 102   Ht 5' 3 (1.6 m)   Wt 213 lb (96.6 kg)   BMI 37.73 kg/m   Gen. appearance: The patient is well-developed and well-nourished grooming and hygiene are normal The patient is oriented to person place and time The patient's mood is normal and the affect is normal   Gait assessment: The patient stands with  normal gait and station  Lumbar spine  Tenderness  to palpation is noted in the lower L4-5 and 5 S1 segment  Range of motion decreased Muscle tone  normal on the right and left sides of the  spine  Lower extremities  Normal range of motion hip   L5-S1 sensory deficit    The vascular examination mild bilateral peripheral edema

## 2023-11-08 ENCOUNTER — Ambulatory Visit (INDEPENDENT_AMBULATORY_CARE_PROVIDER_SITE_OTHER): Payer: Self-pay

## 2023-11-08 DIAGNOSIS — J309 Allergic rhinitis, unspecified: Secondary | ICD-10-CM

## 2023-11-15 ENCOUNTER — Ambulatory Visit (INDEPENDENT_AMBULATORY_CARE_PROVIDER_SITE_OTHER): Payer: Self-pay

## 2023-11-15 DIAGNOSIS — J309 Allergic rhinitis, unspecified: Secondary | ICD-10-CM

## 2023-11-15 MED ORDER — ALBUTEROL SULFATE HFA 108 (90 BASE) MCG/ACT IN AERS
2.0000 | INHALATION_SPRAY | RESPIRATORY_TRACT | 0 refills | Status: AC | PRN
Start: 2023-11-15 — End: ?

## 2023-11-15 MED ORDER — IPRATROPIUM BROMIDE 0.06 % NA SOLN: 2.0000 | Freq: Two times a day (BID) | NASAL | 0 refills | Status: DC

## 2023-11-22 ENCOUNTER — Ambulatory Visit (INDEPENDENT_AMBULATORY_CARE_PROVIDER_SITE_OTHER): Payer: Self-pay

## 2023-11-22 DIAGNOSIS — J309 Allergic rhinitis, unspecified: Secondary | ICD-10-CM | POA: Diagnosis not present

## 2023-11-29 ENCOUNTER — Ambulatory Visit (INDEPENDENT_AMBULATORY_CARE_PROVIDER_SITE_OTHER): Payer: Self-pay

## 2023-11-29 DIAGNOSIS — J309 Allergic rhinitis, unspecified: Secondary | ICD-10-CM

## 2023-11-29 NOTE — Therapy (Signed)
 OUTPATIENT PHYSICAL THERAPY THORACOLUMBAR EVALUATION   Patient Name: Kendra Orozco MRN: [id number] DOB:1945-06-05, 78 y.o., female Today's Date: 12/02/2023  END OF SESSION:  PT End of Session - 12/02/23 0846     Visit Number 1    Number of Visits 8    Date for PT Re-Evaluation 12/30/23    Authorization Type HTA    Authorization Time Period no auth needed    PT Start Time 0846    PT Stop Time 0925    PT Time Calculation (min) 39 min    Activity Tolerance Patient tolerated treatment well    Behavior During Therapy [facility] for tasks assessed/performed          Past Medical History:  Diagnosis Date   Angio-edema    Anxiety    Arthritis    Asthma    Cancer (HCC)    skin and colon   GERD (gastroesophageal reflux disease)    Gout    Hypertension    Urticaria    Past Surgical History:  Procedure Laterality Date   ABDOMINAL HYSTERECTOMY     fibroid tumors   APPENDECTOMY     BACK SURGERY     BIOPSY  01/08/2022   Procedure: BIOPSY;  Surgeon: Shaaron Lamar HERO, MD;  [facility];  Service: Endoscopy;;   BREAST CYST EXCISION Right    CATARACT EXTRACTION W/PHACO Right 11/20/2019   Procedure: CATARACT EXTRACTION PHACO AND INTRAOCULAR LENS PLACEMENT (IOC) RIGHT EYE;  Surgeon: Harrie Agent, MD;  Location: [facility];  Service: Ophthalmology;  Laterality: Right;  CDE: 17.30   CATARACT EXTRACTION W/PHACO Left 12/04/2019   Procedure: CATARACT EXTRACTION PHACO AND INTRAOCULAR LENS PLACEMENT LEFT EYE;  Surgeon: Harrie Agent, MD;  Location: [facility];  Service: Ophthalmology;  Laterality: Left;  CDE: 14.59   COLONOSCOPY  09/16/2007   MFM:Jwjo canal hemorrhoids, otherwise normal rectum left-sided diverticula and colonic mucosa appeared normal.   COLONOSCOPY N/A 03/02/2013   Dr. Rolfe adenoma. Colonic diverticulosis. Anal canal hemorrhoids-likely source of hematochezia. Surveillance 2019   COLONOSCOPY WITH PROPOFOL  N/A 07/29/2017   Non-bleeding internal hemorrhoids, cecal AVMs.     COLONOSCOPY WITH PROPOFOL  N/A 03/23/2022   Procedure: COLONOSCOPY WITH PROPOFOL ;  Surgeon: Shaaron Lamar HERO, MD;  [facility];  Service: Endoscopy;  Laterality: N/A;  9:00 am   ESOPHAGOGASTRODUODENOSCOPY  09/16/2007   MFM:Jrrzwuljuzi undulating Z-line, tiny distal esophageal  erosions consistent with mild erosive reflux esophagitis, patulous EG junction, small hiatal hernia, otherwise normal stomach, D1, and D2.   ESOPHAGOGASTRODUODENOSCOPY (EGD) WITH PROPOFOL  N/A 01/08/2022   Procedure: ESOPHAGOGASTRODUODENOSCOPY (EGD) WITH PROPOFOL ;  Surgeon: Shaaron Lamar HERO, MD;  [facility];  Service: Endoscopy;  Laterality: N/A;  2:30 pm   HEMORRHOID SURGERY     X 2    MALONEY DILATION N/A 01/08/2022   Procedure: MALONEY DILATION;  Surgeon: Shaaron Lamar HERO, MD;  [facility];  Service: Endoscopy;  Laterality: N/A;   POLYPECTOMY  03/23/2022   Procedure: POLYPECTOMY;  Surgeon: Shaaron Lamar HERO, MD;  [facility];  Service: Endoscopy;;   Patient Active Problem List   Diagnosis Date Noted   Chronic fatigue 10/10/2023   Chronic gouty arthritis 10/10/2023   Skin sensation disturbance 10/10/2023   Fibromyalgia 10/10/2023   Acute recurrent maxillary sinusitis 09/21/2023   Sensorineural hearing loss, bilateral 09/21/2023   Impacted cerumen of both ears 09/21/2023   Pain in right hip 08/22/2023   Candidal intertrigo 06/24/2023   Arthralgia of right ankle 02/21/2023  Tinnitus of both ears 01/30/2023   Chronic rhinitis 01/30/2023   Deviated nasal septum 01/30/2023   Hypertrophy of nasal turbinates 01/30/2023   Morbid obesity (HCC) 08/22/2022   Prediabetes 08/22/2022   Edema 02/21/2022   Hypokalemia 02/21/2022   IDA (iron deficiency anemia) 02/20/2022   Elevated alkaline phosphatase level 02/20/2022   Impaired fasting glucose 02/20/2022   Dysphagia 12/14/2021   Arthralgia of right knee 11/08/2021   Hidradenitis suppurativa of anus 11/08/2021   Right upper  quadrant pain 11/08/2021   Stage 3a chronic kidney disease (HCC) 11/08/2021   Nausea 06/09/2021   Polymyalgia rheumatica (HCC) 12/22/2020   Gout 12/15/2020   Acute pharyngitis 10/20/2020   Dyspnea 10/20/2020   Sneezing 10/20/2020   Cough 10/05/2020   Abnormal white blood cell (WBC) count 09/15/2020   Chronic low back pain 09/15/2020   External hemorrhoids 09/15/2020   Generalized anxiety disorder 09/15/2020   Generalized osteoarthritis 09/15/2020   Mild persistent asthma 09/15/2020   Mixed hyperlipidemia 09/15/2020   Obesity 09/15/2020   Allergic rhinitis 09/15/2020   Hyponatremia 06/06/2020   COVID-19 virus infection 06/06/2020   HTN (hypertension) 06/06/2020   Prolapsed internal hemorrhoids, grade 3 05/12/2020   Constipation 04/15/2013   Rectal bleeding 02/11/2013   Gastroesophageal reflux disease 02/11/2013    PCP: Shona Norleen PEDLAR, MD  REFERRING PROVIDER: Margrette Taft BRAVO, MD  REFERRING DIAG: M54.50,G89.29 (ICD-10-CM) - Chronic low back pain, unspecified back pain laterality, unspecified whether sciatica present  Rationale for Evaluation and Treatment: Rehabilitation  THERAPY DIAG:  Low back pain, unspecified back pain laterality, unspecified chronicity, unspecified whether sciatica present  ONSET DATE: chronic back pain  SUBJECTIVE:                                                                                                                                                                                           SUBJECTIVE STATEMENT:*HOH History of back surgery years ago; chronic back pain.  Went to Dr. Margrette about her back; he took x-rays and referred to therapy.  History of right side weakness from the back and then has had some right ankle issues; fx and multiple sprains; also states she has some leg weakness and perhaps varicose veins; difficulty standing more than 15 min at a time.    PERTINENT HISTORY:  L4-L5 back surgery 1984  Hemorrhoids History of  gout  Allergy  shots/asthma   PAIN:  Are you having pain? Yes: NPRS scale: 4/10 sitting; 9-10/10 with prolonged standing Pain location: low back Pain description: sore and radiating  Aggravating factors: prolonged standing, prolonged sitting Relieving factors: muscle rub, sitting down and rest, walking  PRECAUTIONS: None  WEIGHT BEARING RESTRICTIONS: No  FALLS:  Has patient fallen in last 6 months? No  OCCUPATION: retired Careers adviser  PLOF: Independent  PATIENT GOALS: get stronger and be able to stand more than 15 min at at time  NEXT MD VISIT: PRN  OBJECTIVE:  Note: Objective measures were completed at Evaluation unless otherwise noted.  DIAGNOSTIC FINDINGS:  Lower back pain history of L4-5 surgery   5 views of the spine.  Coronal and sagittal plane abnormal alignment.  Facet arthritis grade 2 spondylo at L4-L5 or L5-S1, difficult to tell spot films suggested L5-S1   Impression grade 2 spondylolisthesis chronic degenerative changes in the facet joints spondylosis    PATIENT SURVEYS:  Modified Oswestry:  MODIFIED OSWESTRY DISABILITY SCALE  Date: 12/02/2023 Score                                Total 22/50; 44%   Interpretation of scores: Score Category Description  0-20% Minimal Disability The patient can cope with most living activities. Usually no treatment is indicated apart from advice on lifting, sitting and exercise  21-40% Moderate Disability The patient experiences more pain and difficulty with sitting, lifting and standing. Travel and social life are more difficult and they may be disabled from work. Personal care, sexual activity and sleeping are not grossly affected, and the patient can usually be managed by conservative means  41-60% Severe Disability Pain remains the main problem in this group, but activities of daily living are affected. These patients require a detailed investigation  61-80% Crippled Back pain impinges on all aspects of the  patient's life. Positive intervention is required  81-100% Bed-bound  These patients are either bed-bound or exaggerating their symptoms  Bluford FORBES Zoe DELENA Karon DELENA, et al. Surgery versus conservative management of stable thoracolumbar fracture: the PRESTO feasibility RCT. [place] ([place]): [employer]; 2021 Nov. [facility] Technology Assessment, No. 25.62.) Appendix 3, Oswestry Disability Index category descriptors. Available from: FindJewel[employer].cz  Minimally Clinically Important Difference (MCID) = 12.8%  COGNITION: Overall cognitive status: Within functional limits for tasks assessed     SENSATION: Pain down right leg; numbness down right leg    POSTURE: rounded shoulders and forward head  PALPATION: General soreness lumbar paraspinals right side glutes and piriformis  LUMBAR ROM: *pain  AROM eval  Flexion Fingertips to 1 above ankle* low back  Extension 20% available* more painful than forwards  Right lateral flexion Fingertips to knee joint line  Left lateral flexion Fingertips to knee joint line  Right rotation   Left rotation    (Blank rows = not tested)  LOWER EXTREMITY ROM:     Active  Right eval Left eval  Hip flexion    Hip extension    Hip abduction    Hip adduction    Hip internal rotation    Hip external rotation    Knee flexion    Knee extension    Ankle dorsiflexion    Ankle plantarflexion    Ankle inversion    Ankle eversion     (Blank rows = not tested)  LOWER EXTREMITY MMT:    MMT Right eval Left eval  Hip flexion 4- 4+  Hip extension 4- 4-  Hip abduction    Hip adduction    Hip internal rotation    Hip external rotation    Knee flexion 4 4  Knee extension 4 4+  Ankle dorsiflexion 4 4+  Ankle  plantarflexion    Ankle inversion    Ankle eversion     (Blank rows = not tested)  FUNCTIONAL TESTS:  5 times sit to stand: 31.38 sec no UE assist  GAIT: Distance walked: 60 ft in clinic Assistive  device utilized: None Level of assistance: Complete Independence Comments: decreased gait speed  TREATMENT DATE: 12/02/23 physical therapy evaluation and HEP instruction                                                                                                                                 PATIENT EDUCATION:  Education details: Patient educated on exam findings, POC, scope of PT, HEP, and what to expect next visit. Person educated: Patient Education method: Explanation, Demonstration, and Handouts Education comprehension: verbalized understanding, returned demonstration, verbal cues required, and tactile cues required  HOME EXERCISE PROGRAM: Decompression exercises 1-5  Access Code: [email or web address] URL: [email or web address] Date: 12/02/2023 Prepared by: [facility]  Exercises - Supine Posterior Pelvic Tilt  - 2 x daily - 7 x weekly - 1 sets - 10 reps - 3 sec hold ASSESSMENT:  CLINICAL IMPRESSION: Patient is a 78 y.o. female who was seen today for physical therapy evaluation and treatment for M54.50,G89.29 (ICD-10-CM) - Chronic low back pain, unspecified back pain laterality, unspecified whether sciatica present.  Patient demonstrates muscle weakness, reduced ROM, and fascial restrictions which are likely contributing to symptoms of pain and are negatively impacting patient ability to perform ADLs and functional mobility tasks. Patient will benefit from skilled physical therapy services to address these deficits to reduce pain and improve level of function with ADLs and functional mobility tasks.   OBJECTIVE IMPAIRMENTS: Abnormal gait, decreased mobility, decreased ROM, decreased strength, impaired perceived functional ability, postural dysfunction, obesity, and pain.   ACTIVITY LIMITATIONS: carrying, lifting, bending, sitting, standing, squatting, sleeping, bathing, and locomotion level  PARTICIPATION LIMITATIONS: meal prep, cleaning, shopping, and community  activity  REHAB POTENTIAL: Good  CLINICAL DECISION MAKING: Evolving/moderate complexity  EVALUATION COMPLEXITY: Moderate   GOALS: Goals reviewed with patient? No  SHORT TERM GOALS: Target date: 12/16/2023  patient will be independent with initial HEP  Baseline: Goal status: INITIAL  2.  Patient will report 30% improvement overall  Baseline:  Goal status: INITIAL   LONG TERM GOALS: Target date: 12/30/2023  Patient will be independent in self management strategies to improve quality of life and functional outcomes.  Baseline:  Goal status: INITIAL  2.  Patient will report 50% improvement overall  Baseline:  Goal status: INITIAL  3.  Patient will improve 5 times sit to stand score to 15 sec or less to demonstrate improved functional mobility and increased leg strength.     Baseline: 31.38 sec Goal status: INITIAL  4.   Patient will increase bilateral leg MMT's to 5/5 to allow navigation of steps without gait deviation or loss of balance Baseline:  Goal status: INITIAL  5.  Patient will be able to stand  x 30 min to cook a meal without back pain >3/10 Baseline:  Goal status: INITIAL   PLAN:  PT FREQUENCY: 2x/week  PT DURATION: 4 weeks  PLANNED INTERVENTIONS: [id number]- PT Re-evaluation, 97110-Therapeutic exercises, 97530- Therapeutic activity, 97112- Neuromuscular re-education, 97535- Self Care, [id number]- Manual therapy, [id number]- Gait training, [phone]- Orthotic Fit/training, [phone]- Canalith repositioning, [id number]- Aquatic Therapy, [phone]- Splinting, [phone]- Wound care (first 20 sq cm), 97598- Wound care (each additional 20 sq cm)Patient/Family education, Balance training, Stair training, Taping, Dry Needling, Joint mobilization, Joint manipulation, Spinal manipulation, Spinal mobilization, Scar mobilization, and DME instructions. SABRA  PLAN FOR NEXT SESSION: Review HEP and goals; decompression exercises; postural strengthening.     9:47 AM, 12/02/23 Cattaleya Wien Small Tanairy Payeur  MPT [place] [facility] [place] [phone]

## 2023-12-02 ENCOUNTER — Ambulatory Visit (HOSPITAL_COMMUNITY): Attending: Orthopedic Surgery

## 2023-12-02 ENCOUNTER — Other Ambulatory Visit: Payer: Self-pay

## 2023-12-02 DIAGNOSIS — M6281 Muscle weakness (generalized): Secondary | ICD-10-CM | POA: Insufficient documentation

## 2023-12-02 DIAGNOSIS — M545 Low back pain, unspecified: Secondary | ICD-10-CM | POA: Insufficient documentation

## 2023-12-02 DIAGNOSIS — G8929 Other chronic pain: Secondary | ICD-10-CM | POA: Insufficient documentation

## 2023-12-02 DIAGNOSIS — R29898 Other symptoms and signs involving the musculoskeletal system: Secondary | ICD-10-CM | POA: Insufficient documentation

## 2023-12-05 ENCOUNTER — Ambulatory Visit (HOSPITAL_COMMUNITY): Admitting: Physical Therapy

## 2023-12-05 DIAGNOSIS — R29898 Other symptoms and signs involving the musculoskeletal system: Secondary | ICD-10-CM

## 2023-12-05 DIAGNOSIS — M545 Low back pain, unspecified: Secondary | ICD-10-CM | POA: Diagnosis not present

## 2023-12-05 DIAGNOSIS — M6281 Muscle weakness (generalized): Secondary | ICD-10-CM

## 2023-12-05 NOTE — Therapy (Signed)
 OUTPATIENT PHYSICAL THERAPY THORACOLUMBAR TREATMENT   Patient Name: Kendra Orozco MRN: [id number] DOB:[birth year], 78 y.o., female Today's Date: 12/05/2023  END OF SESSION:  PT End of Session - 12/05/23 1006     Visit Number 2    Number of Visits 8    Date for PT Re-Evaluation 12/30/23    Authorization Type HTA    Authorization Time Period no auth needed    PT Start Time 1020    PT Stop Time 1100    PT Time Calculation (min) 40 min    Activity Tolerance Patient tolerated treatment well    Behavior During Therapy WFL for tasks assessed/performed          Past Medical History:  Diagnosis Date   Angio-edema    Anxiety    Arthritis    Asthma    Cancer (HCC)    skin and colon   GERD (gastroesophageal reflux disease)    Gout    Hypertension    Urticaria    Past Surgical History:  Procedure Laterality Date   ABDOMINAL HYSTERECTOMY     fibroid tumors   APPENDECTOMY     BACK SURGERY     BIOPSY  01/08/2022   Procedure: BIOPSY;  Surgeon: Shaaron Lamar HERO, MD;  Location: [facility];  Service: Endoscopy;;   BREAST CYST EXCISION Right    CATARACT EXTRACTION W/PHACO Right 11/20/2019   Procedure: CATARACT EXTRACTION PHACO AND INTRAOCULAR LENS PLACEMENT (IOC) RIGHT EYE;  Surgeon: Harrie Agent, MD;  Location: [facility];  Service: Ophthalmology;  Laterality: Right;  CDE: 17.30   CATARACT EXTRACTION W/PHACO Left 12/04/2019   Procedure: CATARACT EXTRACTION PHACO AND INTRAOCULAR LENS PLACEMENT LEFT EYE;  Surgeon: Harrie Agent, MD;  Location: [facility];  Service: Ophthalmology;  Laterality: Left;  CDE: 14.59   COLONOSCOPY  09/16/2007   MFM:Jwjo canal hemorrhoids, otherwise normal rectum left-sided diverticula and colonic mucosa appeared normal.   COLONOSCOPY N/A 03/02/2013   Dr. Rolfe adenoma. Colonic diverticulosis. Anal canal hemorrhoids-likely source of hematochezia. Surveillance 2019   COLONOSCOPY WITH PROPOFOL  N/A 07/29/2017   Non-bleeding internal hemorrhoids, cecal AVMs.     COLONOSCOPY WITH PROPOFOL  N/A 03/23/2022   Procedure: COLONOSCOPY WITH PROPOFOL ;  Surgeon: Shaaron Lamar HERO, MD;  [facility]: [facility];  Service: Endoscopy;  Laterality: N/A;  9:00 am   ESOPHAGOGASTRODUODENOSCOPY  09/16/2007   MFM:Jrrzwuljuzi undulating Z-line, tiny distal esophageal  erosions consistent with mild erosive reflux esophagitis, patulous EG junction, small hiatal hernia, otherwise normal stomach, D1, and D2.   ESOPHAGOGASTRODUODENOSCOPY (EGD) WITH PROPOFOL  N/A 01/08/2022   Procedure: ESOPHAGOGASTRODUODENOSCOPY (EGD) WITH PROPOFOL ;  Surgeon: Shaaron Lamar HERO, MD;  [facility];  Service: Endoscopy;  Laterality: N/A;  2:30 pm   HEMORRHOID SURGERY     X 2    MALONEY DILATION N/A 01/08/2022   Procedure: MALONEY DILATION;  Surgeon: Shaaron Lamar HERO, MD;  [facility]: [facility];  Service: Endoscopy;  Laterality: N/A;   POLYPECTOMY  03/23/2022   Procedure: POLYPECTOMY;  Surgeon: Shaaron Lamar HERO, MD;  [facility]: [facility];  Service: Endoscopy;;   Patient Active Problem List   Diagnosis Date Noted   Chronic fatigue 10/10/2023   Chronic gouty arthritis 10/10/2023   Skin sensation disturbance 10/10/2023   Fibromyalgia 10/10/2023   Acute recurrent maxillary sinusitis 09/21/2023   Sensorineural hearing loss, bilateral 09/21/2023   Impacted cerumen of both ears 09/21/2023   Pain in right hip 08/22/2023   Candidal intertrigo 06/24/2023   Arthralgia of right ankle 02/21/2023  Tinnitus of both ears 01/30/2023   Chronic rhinitis 01/30/2023   Deviated nasal septum 01/30/2023   Hypertrophy of nasal turbinates 01/30/2023   Morbid obesity (HCC) 08/22/2022   Prediabetes 08/22/2022   Edema 02/21/2022   Hypokalemia 02/21/2022   IDA (iron deficiency anemia) 02/20/2022   Elevated alkaline phosphatase level 02/20/2022   Impaired fasting glucose 02/20/2022   Dysphagia 12/14/2021   Arthralgia of right knee 11/08/2021   Hidradenitis suppurativa of anus 11/08/2021   Right upper  quadrant pain 11/08/2021   Stage 3a chronic kidney disease (HCC) 11/08/2021   Nausea 06/09/2021   Polymyalgia rheumatica (HCC) 12/22/2020   Gout 12/15/2020   Acute pharyngitis 10/20/2020   Dyspnea 10/20/2020   Sneezing 10/20/2020   Cough 10/05/2020   Abnormal white blood cell (WBC) count 09/15/2020   Chronic low back pain 09/15/2020   External hemorrhoids 09/15/2020   Generalized anxiety disorder 09/15/2020   Generalized osteoarthritis 09/15/2020   Mild persistent asthma 09/15/2020   Mixed hyperlipidemia 09/15/2020   Obesity 09/15/2020   Allergic rhinitis 09/15/2020   Hyponatremia 06/06/2020   COVID-19 virus infection 06/06/2020   HTN (hypertension) 06/06/2020   Prolapsed internal hemorrhoids, grade 3 05/12/2020   Constipation 04/15/2013   Rectal bleeding 02/11/2013   Gastroesophageal reflux disease 02/11/2013    PCP: Shona Norleen PEDLAR, MD  REFERRING PROVIDER: Margrette Taft BRAVO, MD  REFERRING DIAG: M54.50,G89.29 (ICD-10-CM) - Chronic low back pain, unspecified back pain laterality, unspecified whether sciatica present  Rationale for Evaluation and Treatment: Rehabilitation  THERAPY DIAG:  Low back pain, unspecified back pain laterality, unspecified chronicity, unspecified whether sciatica present  Other symptoms and signs involving the musculoskeletal system  Muscle weakness (generalized)  ONSET DATE: chronic back pain  SUBJECTIVE:                                                                                                                                                                                           SUBJECTIVE STATEMENT:*HOH Pt reports knee issues and back.  Achey and painful today due to weather change.  Currently 4-5/10.  Higher pain when first gets up and then decreases   Evaluation: History of back surgery years ago; chronic back pain.  Went to Dr. Margrette about her back; he took x-rays and referred to therapy.  History of right side weakness from  the back and then has had some right ankle issues; fx and multiple sprains; also states she has some leg weakness and perhaps varicose veins; difficulty standing more than 15 min at a time.    PERTINENT HISTORY:  L4-L5 back surgery 1984  Hemorrhoids History of gout  Allergy  shots/asthma  PAIN:  Are you having pain? Yes: NPRS scale: 4/10 sitting; 9-10/10 with prolonged standing Pain location: low back Pain description: sore and radiating  Aggravating factors: prolonged standing, prolonged sitting Relieving factors: muscle rub, sitting down and rest, walking  PRECAUTIONS: None   WEIGHT BEARING RESTRICTIONS: No  FALLS:  Has patient fallen in last 6 months? No  OCCUPATION: retired Careers adviser  PLOF: Independent  PATIENT GOALS: get stronger and be able to stand more than 15 min at at time  NEXT MD VISIT: PRN  OBJECTIVE:  Note: Objective measures were completed at Evaluation unless otherwise noted.  DIAGNOSTIC FINDINGS:  Lower back pain history of L4-5 surgery   5 views of the spine.  Coronal and sagittal plane abnormal alignment.  Facet arthritis grade 2 spondylo at L4-L5 or L5-S1, difficult to tell spot films suggested L5-S1   Impression grade 2 spondylolisthesis chronic degenerative changes in the facet joints spondylosis    PATIENT SURVEYS:  Modified Oswestry:  MODIFIED OSWESTRY DISABILITY SCALE  Date: 12/02/2023 Score                                Total 22/50; 44%   Interpretation of scores: Score Category Description  0-20% Minimal Disability The patient can cope with most living activities. Usually no treatment is indicated apart from advice on lifting, sitting and exercise  21-40% Moderate Disability The patient experiences more pain and difficulty with sitting, lifting and standing. Travel and social life are more difficult and they may be disabled from work. Personal care, sexual activity and sleeping are not grossly affected, and the patient can  usually be managed by conservative means  41-60% Severe Disability Pain remains the main problem in this group, but activities of daily living are affected. These patients require a detailed investigation  61-80% Crippled Back pain impinges on all aspects of the patient's life. Positive intervention is required  81-100% Bed-bound  These patients are either bed-bound or exaggerating their symptoms  Bluford FORBES Zoe DELENA Karon DELENA, et al. Surgery versus conservative management of stable thoracolumbar fracture: the PRESTO feasibility RCT. [place] ([place]): [employer]; 2021 Nov. [facility] Technology Assessment, No. 25.62.) Appendix 3, Oswestry Disability Index category descriptors. Available from: [employer]cz  Minimally Clinically Important Difference (MCID) = 12.8%  COGNITION: Overall cognitive status: Within functional limits for tasks assessed     SENSATION: Pain down right leg; numbness down right leg    POSTURE: rounded shoulders and forward head  PALPATION: General soreness lumbar paraspinals right side glutes and piriformis  LUMBAR ROM: *pain  AROM eval  Flexion Fingertips to 1 above ankle* low back  Extension 20% available* more painful than forwards  Right lateral flexion Fingertips to knee joint line  Left lateral flexion Fingertips to knee joint line  Right rotation   Left rotation    (Blank rows = not tested)  LOWER EXTREMITY MMT:    MMT Right eval Left eval  Hip flexion 4- 4+  Hip extension 4- 4-  Hip abduction    Hip adduction    Hip internal rotation    Hip external rotation    Knee flexion 4 4  Knee extension 4 4+  Ankle dorsiflexion 4 4+  Ankle plantarflexion    Ankle inversion    Ankle eversion     (Blank rows = not tested)  FUNCTIONAL TESTS:  5 times sit to stand: 31.38 sec no UE assist  GAIT: Distance walked:  60 ft in clinic Assistive device utilized: None Level of assistance: Complete  Independence Comments: decreased gait speed  TREATMENT DATE:  12/05/23 Goal review Standing: hip excursions 3 ways 10X5 Supine:  PPT 10X5 holds  Decompression 2-5, 5X5 Seated hamstring stretch with LE propped on chair in front 3X30 each    12/02/23 physical therapy evaluation and HEP instruction                                                                                                                                 PATIENT EDUCATION:  Education details: Patient educated on exam findings, POC, scope of PT, HEP, and what to expect next visit. Person educated: Patient Education method: Explanation, Demonstration, and Handouts Education comprehension: verbalized understanding, returned demonstration, verbal cues required, and tactile cues required  HOME EXERCISE PROGRAM:  Access Code: [email or web address] URL: [email or web address] Date: 12/02/2023 Prepared by: [facility] Exercises - Supine Posterior Pelvic Tilt  - 2 x daily - 7 x weekly - 1 sets - 10 reps - 3 sec hold   -Decompression exercises 1-5   Access Code: [email or web address] URL: [email or web address] Date: 12/05/2023 Prepared by: Greig Fuse Exercises - Seated Hamstring Stretch with Chair  - 2 x daily - 7 x weekly - 3 reps - 30 sec hold -hip excursions 3 motions 10X each (no bouncing in stretch)  ASSESSMENT:  CLINICAL IMPRESSION: Reviewed goals and POC moving forward.  Began with lumbar/hip mobility with cues to hold static stretch as tends to bounce into end ROM.  Hamstring stretch added in seated with visible tightness bilaterally.  Pt able to demonstrate decompression exercises correctly however required cues for sequence/order. Updated HEP to include seated hamstring stretch and hip mobilty.  Pt reported no pain or issues during or at completion of session today.  Patient will benefit from skilled physical therapy services to address these deficits to reduce pain and improve level of function with  ADLs and functional mobility tasks.   OBJECTIVE IMPAIRMENTS: Abnormal gait, decreased mobility, decreased ROM, decreased strength, impaired perceived functional ability, postural dysfunction, obesity, and pain.   ACTIVITY LIMITATIONS: carrying, lifting, bending, sitting, standing, squatting, sleeping, bathing, and locomotion level  PARTICIPATION LIMITATIONS: meal prep, cleaning, shopping, and community activity  REHAB POTENTIAL: Good  CLINICAL DECISION MAKING: Evolving/moderate complexity  EVALUATION COMPLEXITY: Moderate   GOALS: Goals reviewed with patient? No  SHORT TERM GOALS: Target date: 12/16/2023  patient will be independent with initial HEP  Baseline: Goal status: INITIAL  2.  Patient will report 30% improvement overall  Baseline:  Goal status: INITIAL   LONG TERM GOALS: Target date: 12/30/2023  Patient will be independent in self management strategies to improve quality of life and functional outcomes.  Baseline:  Goal status: INITIAL  2.  Patient will report 50% improvement overall  Baseline:  Goal status: INITIAL  3.  Patient will improve 5 times sit to stand score to 15 sec or less to demonstrate improved functional  mobility and increased leg strength.     Baseline: 31.38 sec Goal status: INITIAL  4.   Patient will increase bilateral leg MMT's to 5/5 to allow navigation of steps without gait deviation or loss of balance Baseline:  Goal status: INITIAL  5.  Patient will be able to stand x 30 min to cook a meal without back pain >3/10 Baseline:  Goal status: INITIAL   PLAN:  PT FREQUENCY: 2x/week  PT DURATION: 4 weeks  PLANNED INTERVENTIONS: [id number]- PT Re-evaluation, 97110-Therapeutic exercises, 97530- Therapeutic activity, 97112- Neuromuscular re-education, 97535- Self Care, [id number]- Manual therapy, [id number]- Gait training, [phone]- Orthotic Fit/training, [phone]- Canalith repositioning, [id number]- Aquatic Therapy, 97760- Splinting, [phone]- Wound care  (first 20 sq cm), 97598- Wound care (each additional 20 sq cm)Patient/Family education, Balance training, Stair training, Taping, Dry Needling, Joint mobilization, Joint manipulation, Spinal manipulation, Spinal mobilization, Scar mobilization, and DME instructions. SABRA  PLAN FOR NEXT SESSION:  decompression exercises; postural strengthening.     2:28 PM, 12/05/23 Greig KATHEE Fuse, PTA/CLT [facility] Ph: [phone]

## 2023-12-11 ENCOUNTER — Ambulatory Visit (HOSPITAL_COMMUNITY)

## 2023-12-11 ENCOUNTER — Encounter (HOSPITAL_COMMUNITY): Payer: Self-pay

## 2023-12-11 DIAGNOSIS — R29898 Other symptoms and signs involving the musculoskeletal system: Secondary | ICD-10-CM

## 2023-12-11 DIAGNOSIS — M545 Low back pain, unspecified: Secondary | ICD-10-CM | POA: Diagnosis not present

## 2023-12-11 DIAGNOSIS — M6281 Muscle weakness (generalized): Secondary | ICD-10-CM
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
## 2023-12-12 ENCOUNTER — Other Ambulatory Visit: Payer: Self-pay | Admitting: Internal Medicine

## 2023-12-12 ENCOUNTER — Encounter: Payer: Self-pay | Admitting: Internal Medicine

## 2023-12-12 DIAGNOSIS — J019 Acute sinusitis, unspecified: Secondary | ICD-10-CM | POA: Diagnosis not present

## 2023-12-12 DIAGNOSIS — K219 Gastro-esophageal reflux disease without esophagitis: Secondary | ICD-10-CM

## 2023-12-13 ENCOUNTER — Ambulatory Visit (HOSPITAL_COMMUNITY)

## 2023-12-13 ENCOUNTER — Encounter (HOSPITAL_COMMUNITY): Payer: Self-pay

## 2023-12-13 DIAGNOSIS — R29898 Other symptoms and signs involving the musculoskeletal system: Secondary | ICD-10-CM

## 2023-12-13 DIAGNOSIS — M545 Low back pain, unspecified: Secondary | ICD-10-CM

## 2023-12-13 DIAGNOSIS — M6281 Muscle weakness (generalized): Secondary | ICD-10-CM

## 2023-12-13 NOTE — Therapy (Signed)
 OUTPATIENT PHYSICAL THERAPY THORACOLUMBAR TREATMENT   Patient Name: Kendra Orozco MRN: [id number] DOB:[birth year], 78 y.o., female Today's Date: 12/13/2023  END OF SESSION:  PT End of Session - 12/13/23 1153     Visit Number 4    Number of Visits 8    Date for PT Re-Evaluation 12/30/23    Authorization Type HTA    Authorization Time Period no auth needed    PT Start Time 1153    PT Stop Time 1225    PT Time Calculation (min) 32 min    Activity Tolerance Patient tolerated treatment well    Behavior During Therapy WFL for tasks assessed/performed            Past Medical History:  Diagnosis Date   Angio-edema    Anxiety    Arthritis    Asthma    Cancer (HCC)    skin and colon   GERD (gastroesophageal reflux disease)    Gout    Hypertension    Urticaria    Past Surgical History:  Procedure Laterality Date   ABDOMINAL HYSTERECTOMY     fibroid tumors   APPENDECTOMY     BACK SURGERY     BIOPSY  01/08/2022   Procedure: BIOPSY;  Surgeon: Shaaron Lamar HERO, MD;  [facility]: [facility];  Service: Endoscopy;;   BREAST CYST EXCISION Right    CATARACT EXTRACTION W/PHACO Right 11/20/2019   Procedure: CATARACT EXTRACTION PHACO AND INTRAOCULAR LENS PLACEMENT (IOC) RIGHT EYE;  Surgeon: Harrie Agent, MD;  Location: [facility];  Service: Ophthalmology;  Laterality: Right;  CDE: 17.30   CATARACT EXTRACTION W/PHACO Left 12/04/2019   Procedure: CATARACT EXTRACTION PHACO AND INTRAOCULAR LENS PLACEMENT LEFT EYE;  Surgeon: Harrie Agent, MD;  Location: [facility];  Service: Ophthalmology;  Laterality: Left;  CDE: 14.59   COLONOSCOPY  09/16/2007   MFM:Jwjo canal hemorrhoids, otherwise normal rectum left-sided diverticula and colonic mucosa appeared normal.   COLONOSCOPY N/A 03/02/2013   Dr. Rolfe adenoma. Colonic diverticulosis. Anal canal hemorrhoids-likely source of hematochezia. Surveillance 2019   COLONOSCOPY WITH PROPOFOL  N/A 07/29/2017   Non-bleeding internal hemorrhoids, cecal  AVMs.    COLONOSCOPY WITH PROPOFOL  N/A 03/23/2022   Procedure: COLONOSCOPY WITH PROPOFOL ;  Surgeon: Shaaron Lamar HERO, MD;  [facility]: [facility];  Service: Endoscopy;  Laterality: N/A;  9:00 am   ESOPHAGOGASTRODUODENOSCOPY  09/16/2007   MFM:Jrrzwuljuzi undulating Z-line, tiny distal esophageal  erosions consistent with mild erosive reflux esophagitis, patulous EG junction, small hiatal hernia, otherwise normal stomach, D1, and D2.   ESOPHAGOGASTRODUODENOSCOPY (EGD) WITH PROPOFOL  N/A 01/08/2022   Procedure: ESOPHAGOGASTRODUODENOSCOPY (EGD) WITH PROPOFOL ;  Surgeon: Shaaron Lamar HERO, MD;  [facility];  Service: Endoscopy;  Laterality: N/A;  2:30 pm   HEMORRHOID SURGERY     X 2    MALONEY DILATION N/A 01/08/2022   Procedure: MALONEY DILATION;  Surgeon: Shaaron Lamar HERO, MD;  [facility]: [facility];  Service: Endoscopy;  Laterality: N/A;   POLYPECTOMY  03/23/2022   Procedure: POLYPECTOMY;  Surgeon: Shaaron Lamar HERO, MD;  [facility]: [facility];  Service: Endoscopy;;   Patient Active Problem List   Diagnosis Date Noted   Chronic fatigue 10/10/2023   Chronic gouty arthritis 10/10/2023   Skin sensation disturbance 10/10/2023   Fibromyalgia 10/10/2023   Acute recurrent maxillary sinusitis 09/21/2023   Sensorineural hearing loss, bilateral 09/21/2023   Impacted cerumen of both ears 09/21/2023   Pain in right hip 08/22/2023   Candidal intertrigo 06/24/2023   Arthralgia of right ankle  02/21/2023   Tinnitus of both ears 01/30/2023   Chronic rhinitis 01/30/2023   Deviated nasal septum 01/30/2023   Hypertrophy of nasal turbinates 01/30/2023   Morbid obesity (HCC) 08/22/2022   Prediabetes 08/22/2022   Edema 02/21/2022   Hypokalemia 02/21/2022   IDA (iron deficiency anemia) 02/20/2022   Elevated alkaline phosphatase level 02/20/2022   Impaired fasting glucose 02/20/2022   Dysphagia 12/14/2021   Arthralgia of right knee 11/08/2021   Hidradenitis suppurativa of anus 11/08/2021   Right  upper quadrant pain 11/08/2021   Stage 3a chronic kidney disease (HCC) 11/08/2021   Nausea 06/09/2021   Polymyalgia rheumatica (HCC) 12/22/2020   Gout 12/15/2020   Acute pharyngitis 10/20/2020   Dyspnea 10/20/2020   Sneezing 10/20/2020   Cough 10/05/2020   Abnormal white blood cell (WBC) count 09/15/2020   Chronic low back pain 09/15/2020   External hemorrhoids 09/15/2020   Generalized anxiety disorder 09/15/2020   Generalized osteoarthritis 09/15/2020   Mild persistent asthma 09/15/2020   Mixed hyperlipidemia 09/15/2020   Obesity 09/15/2020   Allergic rhinitis 09/15/2020   Hyponatremia 06/06/2020   COVID-19 virus infection 06/06/2020   HTN (hypertension) 06/06/2020   Prolapsed internal hemorrhoids, grade 3 05/12/2020   Constipation 04/15/2013   Rectal bleeding 02/11/2013   Gastroesophageal reflux disease 02/11/2013    PCP: Shona Norleen PEDLAR, MD  REFERRING PROVIDER: Margrette Taft BRAVO, MD  REFERRING DIAG: M54.50,G89.29 (ICD-10-CM) - Chronic low back pain, unspecified back pain laterality, unspecified whether sciatica present  Rationale for Evaluation and Treatment: Rehabilitation  THERAPY DIAG:  Low back pain, unspecified back pain laterality, unspecified chronicity, unspecified whether sciatica present  Other symptoms and signs involving the musculoskeletal system  Muscle weakness (generalized)  ONSET DATE: chronic back pain  SUBJECTIVE:                                                                                                                                                                                           SUBJECTIVE STATEMENT: Pt states she was pretty sore after last time in low back and legs but legs are better today. States soreness is about a 5-6/10 today, left low back worse than right. Pt states she is able to roll over in bed easier.   Evaluation: History of back surgery years ago; chronic back pain.  Went to Dr. Margrette about her back; he took  x-rays and referred to therapy.  History of right side weakness from the back and then has had some right ankle issues; fx and multiple sprains; also states she has some leg weakness and perhaps varicose veins; difficulty standing more than 15 min at a time.  PERTINENT HISTORY:  L4-L5 back surgery 1984  Hemorrhoids History of gout  Allergy  shots/asthma   PAIN:  Are you having pain? Yes: NPRS scale: 4/10 sitting; 9-10/10 with prolonged standing Pain location: low back Pain description: sore and radiating  Aggravating factors: prolonged standing, prolonged sitting Relieving factors: muscle rub, sitting down and rest, walking  PRECAUTIONS: None   WEIGHT BEARING RESTRICTIONS: No  FALLS:  Has patient fallen in last 6 months? No  OCCUPATION: retired Careers adviser  PLOF: Independent  PATIENT GOALS: get stronger and be able to stand more than 15 min at at time  NEXT MD VISIT: PRN  OBJECTIVE:  Note: Objective measures were completed at Evaluation unless otherwise noted.  DIAGNOSTIC FINDINGS:  Lower back pain history of L4-5 surgery   5 views of the spine.  Coronal and sagittal plane abnormal alignment.  Facet arthritis grade 2 spondylo at L4-L5 or L5-S1, difficult to tell spot films suggested L5-S1   Impression grade 2 spondylolisthesis chronic degenerative changes in the facet joints spondylosis    PATIENT SURVEYS:  Modified Oswestry:  MODIFIED OSWESTRY DISABILITY SCALE  Date: 12/02/2023 Score                                Total 22/50; 44%   Interpretation of scores: Score Category Description  0-20% Minimal Disability The patient can cope with most living activities. Usually no treatment is indicated apart from advice on lifting, sitting and exercise  21-40% Moderate Disability The patient experiences more pain and difficulty with sitting, lifting and standing. Travel and social life are more difficult and they may be disabled from work. Personal care, sexual  activity and sleeping are not grossly affected, and the patient can usually be managed by conservative means  41-60% Severe Disability Pain remains the main problem in this group, but activities of daily living are affected. These patients require a detailed investigation  61-80% Crippled Back pain impinges on all aspects of the patient's life. Positive intervention is required  81-100% Bed-bound  These patients are either bed-bound or exaggerating their symptoms  Bluford FORBES Zoe DELENA Karon DELENA, et al. Surgery versus conservative management of stable thoracolumbar fracture: the PRESTO feasibility RCT. [place] ([place]): [employer]; 2021 Nov. [facility] Technology Assessment, No. 25.62.) Appendix 3, Oswestry Disability Index category descriptors. Available from: [employer]cz  Minimally Clinically Important Difference (MCID) = 12.8%  COGNITION: Overall cognitive status: Within functional limits for tasks assessed     SENSATION: Pain down right leg; numbness down right leg    POSTURE: rounded shoulders and forward head  PALPATION: General soreness lumbar paraspinals right side glutes and piriformis  LUMBAR ROM: *pain  AROM eval  Flexion Fingertips to 1 above ankle* low back  Extension 20% available* more painful than forwards  Right lateral flexion Fingertips to knee joint line  Left lateral flexion Fingertips to knee joint line  Right rotation   Left rotation    (Blank rows = not tested)  LOWER EXTREMITY MMT:    MMT Right eval Left eval  Hip flexion 4- 4+  Hip extension 4- 4-  Hip abduction    Hip adduction    Hip internal rotation    Hip external rotation    Knee flexion 4 4  Knee extension 4 4+  Ankle dorsiflexion 4 4+  Ankle plantarflexion    Ankle inversion    Ankle eversion     (Blank rows = not tested)  FUNCTIONAL TESTS:  5 times sit to stand: 31.38 sec no UE assist  GAIT: Distance walked: 60 ft in clinic Assistive  device utilized: None Level of assistance: Complete Independence Comments: decreased gait speed  TREATMENT DATE:  12/13/2023  Therapeutic Exercise: -Treadmill, 3 minutes, grade 2 incline, pt cued increased heel strike and distance from monitor -Supine bridges with GTB at knees, 1 sets of 10 reps, 5 second holds, pt cued for breathing during core contraction -Lateral stepping 3 laps 20 steps per lap, with RTB around ankles, pt cued for upright posture Neuromuscular Re-education: -Modified crunch, 1 set of 8 reps, 3 second holds, pt cued for breathing pattern -Birddog, 1 set of 5 reps, pt cued for neutral spine and increased core control -Side plank, left side harder, 3 reps of 15 seconds on each side -LTR 2 set of 10 reps bilaterally, pt cued to remain in pain free ROM -Sit to stands with tidal tank sphere pushout, 2 sets of 5 reps, pt cued for core activation  12/11/2023  Manual Therapy: -STM of Lumbar Paraspinal musculature Therapeutic Exercise: -Nustep, 3 minutes and 10 seconds, level 1 resistance, pt cued for 50 SPM, increased back soreness noted -Supine bridges 1 sets of 10 reps, 3 second holds, pt cued for breathing during core contraction -Lateral stepping 3 laps 20 steps per lap, with GTB around ankles, pt cued for upright posture Neuromuscular Re-education: -DKTC, 2 set of 10 reps, pt cued to remain in pain free ROM and to feel RLE is performing most of the motion -Side plank, left side harder, 3 reps of 10 seconds on each side -LTR 2 set of 10 reps bilaterally, pt cued to remain in pain free ROM -Sit to stands with tidal tank sphere pushout, 2 sets of 5 reps, pt cued for core activation    12/05/23 Goal review Standing: hip excursions 3 ways 10X5 Supine:  PPT 10X5 holds  Decompression 2-5, 5X5 Seated hamstring stretch with LE propped on chair in front 3X30 each    PATIENT EDUCATION:  Education details: Patient educated on exam findings, POC, scope of PT, HEP, and  what to expect next visit. Person educated: Patient Education method: Explanation, Demonstration, and Handouts Education comprehension: verbalized understanding, returned demonstration, verbal cues required, and tactile cues required  HOME EXERCISE PROGRAM:  Access Code: [email or web address] URL: [email or web address] Date: 12/02/2023 Prepared by: [facility] - [facility] Exercises - Supine Posterior Pelvic Tilt  - 2 x daily - 7 x weekly - 1 sets - 10 reps - 3 sec hold   -Decompression exercises 1-5   Access Code: [email or web address] URL: [email or web address] Date: 12/05/2023 Prepared by: Greig Fuse Exercises - Seated Hamstring Stretch with Chair  - 2 x daily - 7 x weekly - 3 reps - 30 sec hold -hip excursions 3 motions 10X each (no bouncing in stretch)  ASSESSMENT:  CLINICAL IMPRESSION: Patient continues to demonstrate increased low back pain, decreased LE/core strength, decreased gait quality and balance. Patient also demonstrates decreased endurance with SOB noted during exercise today although pt willing to push through all prescribed activities. Patient able to progress dynamic balance and core activation exercises today with bird dog and modified crunch, good performance with verbal cueing. Patient would continue to benefit from skilled physical therapy for decreased low back pain, increased endurance with ambulation, increased core/LE strength, and improved balance for improved quality of life, improved efficiency with gait training and continued progress towards therapy goals.     OBJECTIVE IMPAIRMENTS: Abnormal gait, decreased mobility, decreased ROM, decreased strength,  impaired perceived functional ability, postural dysfunction, obesity, and pain.   ACTIVITY LIMITATIONS: carrying, lifting, bending, sitting, standing, squatting, sleeping, bathing, and locomotion level  PARTICIPATION LIMITATIONS: meal prep, cleaning, shopping, and community activity  REHAB POTENTIAL:  Good  CLINICAL DECISION MAKING: Evolving/moderate complexity  EVALUATION COMPLEXITY: Moderate   GOALS: Goals reviewed with patient? No  SHORT TERM GOALS: Target date: 12/16/2023  patient will be independent with initial HEP  Baseline: Goal status: INITIAL  2.  Patient will report 30% improvement overall  Baseline:  Goal status: INITIAL   LONG TERM GOALS: Target date: 12/30/2023  Patient will be independent in self management strategies to improve quality of life and functional outcomes.  Baseline:  Goal status: INITIAL  2.  Patient will report 50% improvement overall  Baseline:  Goal status: INITIAL  3.  Patient will improve 5 times sit to stand score to 15 sec or less to demonstrate improved functional mobility and increased leg strength.     Baseline: 31.38 sec Goal status: INITIAL  4.   Patient will increase bilateral leg MMT's to 5/5 to allow navigation of steps without gait deviation or loss of balance Baseline:  Goal status: INITIAL  5.  Patient will be able to stand x 30 min to cook a meal without back pain >3/10 Baseline:  Goal status: INITIAL   PLAN:  PT FREQUENCY: 2x/week  PT DURATION: 4 weeks  PLANNED INTERVENTIONS: 97164- PT Re-evaluation, 97110-Therapeutic exercises, 97530- Therapeutic activity, 97112- Neuromuscular re-education, 97535- Self Care, [id number]- Manual therapy, [id number]- Gait training, [id number]- Orthotic Fit/training, [phone]- Canalith repositioning, [id number]- Aquatic Therapy, 97760- Splinting, [phone]- Wound care (first 20 sq cm), 97598- Wound care (each additional 20 sq cm)Patient/Family education, Balance training, Stair training, Taping, Dry Needling, Joint mobilization, Joint manipulation, Spinal manipulation, Spinal mobilization, Scar mobilization, and DME instructions. SABRA  PLAN FOR NEXT SESSION:  decompression exercises; postural strengthening.     Lang Ada, PT, DPT [facility] Office: [phone] 12:27 PM,  12/13/23

## 2023-12-18 ENCOUNTER — Ambulatory Visit (HOSPITAL_COMMUNITY): Attending: Orthopedic Surgery | Admitting: Physical Therapy

## 2023-12-18 DIAGNOSIS — M6281 Muscle weakness (generalized): Secondary | ICD-10-CM | POA: Diagnosis not present

## 2023-12-18 DIAGNOSIS — M545 Low back pain, unspecified: Secondary | ICD-10-CM | POA: Insufficient documentation

## 2023-12-18 DIAGNOSIS — R29898 Other symptoms and signs involving the musculoskeletal system: Secondary | ICD-10-CM | POA: Diagnosis not present

## 2023-12-18 NOTE — Therapy (Signed)
 OUTPATIENT PHYSICAL THERAPY THORACOLUMBAR TREATMENT   Patient Name: JOUD PETTINATO MRN: [id number] DOB:1945-09-08, 78 y.o., female Today's Date: 12/18/2023  END OF SESSION:  PT End of Session - 12/18/23 1143     Visit Number 5    Number of Visits 8    Date for PT Re-Evaluation 12/30/23    Authorization Type HTA    Authorization Time Period no auth needed    PT Start Time 1102    PT Stop Time 1146    PT Time Calculation (min) 44 min    Activity Tolerance Patient tolerated treatment well    Behavior During Therapy WFL for tasks assessed/performed             Past Medical History:  Diagnosis Date   Angio-edema    Anxiety    Arthritis    Asthma    Cancer (HCC)    skin and colon   GERD (gastroesophageal reflux disease)    Gout    Hypertension    Urticaria    Past Surgical History:  Procedure Laterality Date   ABDOMINAL HYSTERECTOMY     fibroid tumors   APPENDECTOMY     BACK SURGERY     BIOPSY  01/08/2022   Procedure: BIOPSY;  Surgeon: Shaaron Lamar HERO, MD;  [facility]: [facility];  Service: Endoscopy;;   BREAST CYST EXCISION Right    CATARACT EXTRACTION W/PHACO Right 11/20/2019   Procedure: CATARACT EXTRACTION PHACO AND INTRAOCULAR LENS PLACEMENT (IOC) RIGHT EYE;  Surgeon: Harrie Agent, MD;  Location: [facility];  Service: Ophthalmology;  Laterality: Right;  CDE: 17.30   CATARACT EXTRACTION W/PHACO Left 12/04/2019   Procedure: CATARACT EXTRACTION PHACO AND INTRAOCULAR LENS PLACEMENT LEFT EYE;  Surgeon: Harrie Agent, MD;  Location: [facility];  Service: Ophthalmology;  Laterality: Left;  CDE: 14.59   COLONOSCOPY  09/16/2007   MFM:Jwjo canal hemorrhoids, otherwise normal rectum left-sided diverticula and colonic mucosa appeared normal.   COLONOSCOPY N/A 03/02/2013   Dr. Rolfe adenoma. Colonic diverticulosis. Anal canal hemorrhoids-likely source of hematochezia. Surveillance 2019   COLONOSCOPY WITH PROPOFOL  N/A 07/29/2017   Non-bleeding internal hemorrhoids, cecal  AVMs.    COLONOSCOPY WITH PROPOFOL  N/A 03/23/2022   Procedure: COLONOSCOPY WITH PROPOFOL ;  Surgeon: Shaaron Lamar HERO, MD;  [facility]: [facility];  Service: Endoscopy;  Laterality: N/A;  9:00 am   ESOPHAGOGASTRODUODENOSCOPY  09/16/2007   MFM:Jrrzwuljuzi undulating Z-line, tiny distal esophageal  erosions consistent with mild erosive reflux esophagitis, patulous EG junction, small hiatal hernia, otherwise normal stomach, D1, and D2.   ESOPHAGOGASTRODUODENOSCOPY (EGD) WITH PROPOFOL  N/A 01/08/2022   Procedure: ESOPHAGOGASTRODUODENOSCOPY (EGD) WITH PROPOFOL ;  Surgeon: Shaaron Lamar HERO, MD;  [facility];  Service: Endoscopy;  Laterality: N/A;  2:30 pm   HEMORRHOID SURGERY     X 2    MALONEY DILATION N/A 01/08/2022   Procedure: MALONEY DILATION;  Surgeon: Shaaron Lamar HERO, MD;  [facility];  Service: Endoscopy;  Laterality: N/A;   POLYPECTOMY  03/23/2022   Procedure: POLYPECTOMY;  Surgeon: Shaaron Lamar HERO, MD;  [facility];  Service: Endoscopy;;   Patient Active Problem List   Diagnosis Date Noted   Chronic fatigue 10/10/2023   Chronic gouty arthritis 10/10/2023   Skin sensation disturbance 10/10/2023   Fibromyalgia 10/10/2023   Acute recurrent maxillary sinusitis 09/21/2023   Sensorineural hearing loss, bilateral 09/21/2023   Impacted cerumen of both ears 09/21/2023   Pain in right hip 08/22/2023   Candidal intertrigo 06/24/2023   Arthralgia of right  ankle 02/21/2023   Tinnitus of both ears 01/30/2023   Chronic rhinitis 01/30/2023   Deviated nasal septum 01/30/2023   Hypertrophy of nasal turbinates 01/30/2023   Morbid obesity (HCC) 08/22/2022   Prediabetes 08/22/2022   Edema 02/21/2022   Hypokalemia 02/21/2022   IDA (iron deficiency anemia) 02/20/2022   Elevated alkaline phosphatase level 02/20/2022   Impaired fasting glucose 02/20/2022   Dysphagia 12/14/2021   Arthralgia of right knee 11/08/2021   Hidradenitis suppurativa of anus 11/08/2021   Right  upper quadrant pain 11/08/2021   Stage 3a chronic kidney disease (HCC) 11/08/2021   Nausea 06/09/2021   Polymyalgia rheumatica (HCC) 12/22/2020   Gout 12/15/2020   Acute pharyngitis 10/20/2020   Dyspnea 10/20/2020   Sneezing 10/20/2020   Cough 10/05/2020   Abnormal white blood cell (WBC) count 09/15/2020   Chronic low back pain 09/15/2020   External hemorrhoids 09/15/2020   Generalized anxiety disorder 09/15/2020   Generalized osteoarthritis 09/15/2020   Mild persistent asthma 09/15/2020   Mixed hyperlipidemia 09/15/2020   Obesity 09/15/2020   Allergic rhinitis 09/15/2020   Hyponatremia 06/06/2020   COVID-19 virus infection 06/06/2020   HTN (hypertension) 06/06/2020   Prolapsed internal hemorrhoids, grade 3 05/12/2020   Constipation 04/15/2013   Rectal bleeding 02/11/2013   Gastroesophageal reflux disease 02/11/2013    PCP: Shona Norleen PEDLAR, MD  REFERRING PROVIDER: Margrette Taft BRAVO, MD  REFERRING DIAG: M54.50,G89.29 (ICD-10-CM) - Chronic low back pain, unspecified back pain laterality, unspecified whether sciatica present  Rationale for Evaluation and Treatment: Rehabilitation  THERAPY DIAG:  Low back pain, unspecified back pain laterality, unspecified chronicity, unspecified whether sciatica present  Other symptoms and signs involving the musculoskeletal system  Muscle weakness (generalized)  ONSET DATE: chronic back pain  SUBJECTIVE:                                                                                                                                                                                           SUBJECTIVE STATEMENT: Pt states she is still having pain  4-5/10 in both knees, Lt>Rt.  States she thinks the colder weather is making it worse too.     Evaluation: History of back surgery years ago; chronic back pain.  Went to Dr. Margrette about her back; he took x-rays and referred to therapy.  History of right side weakness from the back and then has  had some right ankle issues; fx and multiple sprains; also states she has some leg weakness and perhaps varicose veins; difficulty standing more than 15 min at a time.    PERTINENT HISTORY:  L4-L5 back surgery 1984  Hemorrhoids History of gout  Allergy   shots/asthma   PAIN:  Are you having pain? Yes: NPRS scale: 4/10 sitting; 9-10/10 with prolonged standing Pain location: low back Pain description: sore and radiating  Aggravating factors: prolonged standing, prolonged sitting Relieving factors: muscle rub, sitting down and rest, walking  PRECAUTIONS: None   WEIGHT BEARING RESTRICTIONS: No  FALLS:  Has patient fallen in last 6 months? No  OCCUPATION: retired Careers adviser  PLOF: Independent  PATIENT GOALS: get stronger and be able to stand more than 15 min at at time  NEXT MD VISIT: PRN  OBJECTIVE:  Note: Objective measures were completed at Evaluation unless otherwise noted.  DIAGNOSTIC FINDINGS:  Lower back pain history of L4-5 surgery   5 views of the spine.  Coronal and sagittal plane abnormal alignment.  Facet arthritis grade 2 spondylo at L4-L5 or L5-S1, difficult to tell spot films suggested L5-S1   Impression grade 2 spondylolisthesis chronic degenerative changes in the facet joints spondylosis    PATIENT SURVEYS:  Modified Oswestry:  MODIFIED OSWESTRY DISABILITY SCALE  Date: 12/02/2023 Score                                Total 22/50; 44%   Interpretation of scores: Score Category Description  0-20% Minimal Disability The patient can cope with most living activities. Usually no treatment is indicated apart from advice on lifting, sitting and exercise  21-40% Moderate Disability The patient experiences more pain and difficulty with sitting, lifting and standing. Travel and social life are more difficult and they may be disabled from work. Personal care, sexual activity and sleeping are not grossly affected, and the patient can usually be managed by  conservative means  41-60% Severe Disability Pain remains the main problem in this group, but activities of daily living are affected. These patients require a detailed investigation  61-80% Crippled Back pain impinges on all aspects of the patient's life. Positive intervention is required  81-100% Bed-bound  These patients are either bed-bound or exaggerating their symptoms  Bluford FORBES Zoe DELENA Karon DELENA, et al. Surgery versus conservative management of stable thoracolumbar fracture: the PRESTO feasibility RCT. [place] ([place]): [employer]; 2021 Nov. [facility] Technology Assessment, No. 25.62.) Appendix 3, Oswestry Disability Index category descriptors. Available from: [employer]cz  Minimally Clinically Important Difference (MCID) = 12.8%  COGNITION: Overall cognitive status: Within functional limits for tasks assessed     SENSATION: Pain down right leg; numbness down right leg    POSTURE: rounded shoulders and forward head  PALPATION: General soreness lumbar paraspinals right side glutes and piriformis  LUMBAR ROM: *pain  AROM eval  Flexion Fingertips to 1 above ankle* low back  Extension 20% available* more painful than forwards  Right lateral flexion Fingertips to knee joint line  Left lateral flexion Fingertips to knee joint line  Right rotation   Left rotation    (Blank rows = not tested)  LOWER EXTREMITY MMT:    MMT Right eval Left eval  Hip flexion 4- 4+  Hip extension 4- 4-  Hip abduction    Hip adduction    Hip internal rotation    Hip external rotation    Knee flexion 4 4  Knee extension 4 4+  Ankle dorsiflexion 4 4+  Ankle plantarflexion    Ankle inversion    Ankle eversion     (Blank rows = not tested)  FUNCTIONAL TESTS:  5 times sit to stand: 31.38 sec no UE assist  GAIT: Distance walked: 60 ft in clinic Assistive device utilized: None Level of assistance: Complete Independence Comments: decreased gait  speed  TREATMENT DATE:  12/18/2023  Treadmill, 3 minutes, grade 2 incline, 0.6 mph; cues for increased stride Supine:   bridges 10X5 holds one set, second set with GTB at knees,10X5 second holds LTR 2 set of 10 reps bilaterally, pt cued to remain in pain free ROM Modified crunch, 1 set of 8 reps, 3 second holds, pt cued for breathing pattern Quadruped: Birddog, 1 set of 5 reps, pt cued for neutral spine and increased core control Side plank, left side harder, 3 reps of 5 seconds on each side, modified elbow and knee Sit to stands with 5# KB pushout, 2 sets of 5 reps, pt cued for core activation  12/13/2023  Therapeutic Exercise: -Treadmill, 3 minutes, grade 2 incline, pt cued increased heel strike and distance from monitor -Supine bridges with GTB at knees, 1 sets of 10 reps, 5 second holds, pt cued for breathing during core contraction -Lateral stepping 3 laps 20 steps per lap, with RTB around ankles, pt cued for upright posture Neuromuscular Re-education: -Modified crunch, 1 set of 8 reps, 3 second holds, pt cued for breathing pattern -Birddog, 1 set of 5 reps, pt cued for neutral spine and increased core control -Side plank, left side harder, 3 reps of 15 seconds on each side -LTR 2 set of 10 reps bilaterally, pt cued to remain in pain free ROM -Sit to stands with tidal tank sphere pushout, 2 sets of 5 reps, pt cued for core activation  12/11/2023  Manual Therapy: -STM of Lumbar Paraspinal musculature Therapeutic Exercise: -Nustep, 3 minutes and 10 seconds, level 1 resistance, pt cued for 50 SPM, increased back soreness noted -Supine bridges 1 sets of 10 reps, 3 second holds, pt cued for breathing during core contraction -Lateral stepping 3 laps 20 steps per lap, with GTB around ankles, pt cued for upright posture Neuromuscular Re-education: -DKTC, 2 set of 10 reps, pt cued to remain in pain free ROM and to feel RLE is performing most of the motion -Side plank, left side harder, 3  reps of 10 seconds on each side -LTR 2 set of 10 reps bilaterally, pt cued to remain in pain free ROM -Sit to stands with tidal tank sphere pushout, 2 sets of 5 reps, pt cued for core activation    12/05/23 Goal review Standing: hip excursions 3 ways 10X5 Supine:  PPT 10X5 holds  Decompression 2-5, 5X5 Seated hamstring stretch with LE propped on chair in front 3X30 each    PATIENT EDUCATION:  Education details: Patient educated on exam findings, POC, scope of PT, HEP, and what to expect next visit. Person educated: Patient Education method: Explanation, Demonstration, and Handouts Education comprehension: verbalized understanding, returned demonstration, verbal cues required, and tactile cues required  HOME EXERCISE PROGRAM:  Access Code: [email or web address] URL: [email or web address] Date: 12/02/2023 Prepared by: [facility] - Rehab Exercises - Supine Posterior Pelvic Tilt  - 2 x daily - 7 x weekly - 1 sets - 10 reps - 3 sec hold   -Decompression exercises 1-5   Access Code: [email or web address] URL: [email or web address] Date: 12/05/2023 Prepared by: Greig Fuse Exercises - Seated Hamstring Stretch with Chair  - 2 x daily - 7 x weekly - 3 reps - 30 sec hold -hip excursions 3 motions 10X each (no bouncing in stretch)  ASSESSMENT:  CLINICAL IMPRESSION: Began on treadmill with slower speed due to shortness of breath and general immobility.  PT with cues to increase stride and maintain further up on belt.  Cues also needed for core recruitment with mat activities.  Tolerated supine position for 10 minutes before needing to sit up due to feeling of wheezing and LBP.   More difficulty keeping stable when lifting Lt UE/Rt LE with bird dog in quadruped.  Limited to 5 repetitions before needing a rest break and increased shortness of breath.  Pt with difficulty completing side lying plank with max ability to hold 5 seconds.  Cues to breathe as tends to hold her breath while  completing.  Reduced weight/push out to 5# kettle bell rather than 10# tidal sphere as reported too strenuous with push out activity.  Pt reported reduced pain to 2/10 at end of session.  Much improved control and ability to complete with lighter weight.  Patient will continue to benefit from skilled physical therapy for decreased low back pain, increased endurance with ambulation, increased core/LE strength, and improved balance for improved quality of life, improved efficiency with gait training and continued progress towards therapy goals.     OBJECTIVE IMPAIRMENTS: Abnormal gait, decreased mobility, decreased ROM, decreased strength, impaired perceived functional ability, postural dysfunction, obesity, and pain.   ACTIVITY LIMITATIONS: carrying, lifting, bending, sitting, standing, squatting, sleeping, bathing, and locomotion level  PARTICIPATION LIMITATIONS: meal prep, cleaning, shopping, and community activity  REHAB POTENTIAL: Good  CLINICAL DECISION MAKING: Evolving/moderate complexity  EVALUATION COMPLEXITY: Moderate   GOALS: Goals reviewed with patient? No  SHORT TERM GOALS: Target date: 12/16/2023  patient will be independent with initial HEP  Baseline: Goal status: INITIAL  2.  Patient will report 30% improvement overall  Baseline:  Goal status: INITIAL   LONG TERM GOALS: Target date: 12/30/2023  Patient will be independent in self management strategies to improve quality of life and functional outcomes.  Baseline:  Goal status: INITIAL  2.  Patient will report 50% improvement overall  Baseline:  Goal status: INITIAL  3.  Patient will improve 5 times sit to stand score to 15 sec or less to demonstrate improved functional mobility and increased leg strength.     Baseline: 31.38 sec Goal status: INITIAL  4.   Patient will increase bilateral leg MMT's to 5/5 to allow navigation of steps without gait deviation or loss of balance Baseline:  Goal status:  INITIAL  5.  Patient will be able to stand x 30 min to cook a meal without back pain >3/10 Baseline:  Goal status: INITIAL   PLAN:  PT FREQUENCY: 2x/week  PT DURATION: 4 weeks  PLANNED INTERVENTIONS: 97164- PT Re-evaluation, 97110-Therapeutic exercises, 97530- Therapeutic activity, 97112- Neuromuscular re-education, 97535- Self Care, [id number]- Manual therapy, [id number]- Gait training, [phone]- Orthotic Fit/training, [phone]- Canalith repositioning, [id number]- Aquatic Therapy, 97760- Splinting, [phone]- Wound care (first 20 sq cm), 97598- Wound care (each additional 20 sq cm)Patient/Family education, Balance training, Stair training, Taping, Dry Needling, Joint mobilization, Joint manipulation, Spinal manipulation, Spinal mobilization, Scar mobilization, and DME instructions. SABRA  PLAN FOR NEXT SESSION:  continue to progress core stabilization, postural strengthening and reduce pain and dysfunction.     Greig KATHEE Fuse, PTA/CLT [facility] Ph: [phone] 11:43 AM, 12/18/23

## 2023-12-25 ENCOUNTER — Ambulatory Visit (HOSPITAL_COMMUNITY)

## 2023-12-25 ENCOUNTER — Encounter (HOSPITAL_COMMUNITY): Payer: Self-pay

## 2023-12-25 DIAGNOSIS — M6281 Muscle weakness (generalized): Secondary | ICD-10-CM

## 2023-12-25 DIAGNOSIS — M545 Low back pain, unspecified: Secondary | ICD-10-CM | POA: Diagnosis not present

## 2023-12-25 DIAGNOSIS — R29898 Other symptoms and signs involving the musculoskeletal system: Secondary | ICD-10-CM

## 2023-12-25 NOTE — Therapy (Signed)
 OUTPATIENT PHYSICAL THERAPY THORACOLUMBAR TREATMENT   Patient Name: Kendra Orozco MRN: [id number] DOB:November 15, 1945, 78 y.o., female Today's Date: 12/25/2023  END OF SESSION:  PT End of Session - 12/25/23 1020     Visit Number 6    Number of Visits 8    Date for PT Re-Evaluation 12/30/23    Authorization Type HTA    Authorization Time Period no auth needed    PT Start Time 1020    PT Stop Time 1100    PT Time Calculation (min) 40 min    Activity Tolerance Patient tolerated treatment well    Behavior During Therapy WFL for tasks assessed/performed             Past Medical History:  Diagnosis Date   Angio-edema    Anxiety    Arthritis    Asthma    Cancer (HCC)    skin and colon   GERD (gastroesophageal reflux disease)    Gout    Hypertension    Urticaria    Past Surgical History:  Procedure Laterality Date   ABDOMINAL HYSTERECTOMY     fibroid tumors   APPENDECTOMY     BACK SURGERY     BIOPSY  01/08/2022   Procedure: BIOPSY;  Surgeon: Shaaron Lamar HERO, MD;  [facility]: [facility];  Service: Endoscopy;;   BREAST CYST EXCISION Right    CATARACT EXTRACTION W/PHACO Right 11/20/2019   Procedure: CATARACT EXTRACTION PHACO AND INTRAOCULAR LENS PLACEMENT (IOC) RIGHT EYE;  Surgeon: Harrie Agent, MD;  Location: [facility];  Service: Ophthalmology;  Laterality: Right;  CDE: 17.30   CATARACT EXTRACTION W/PHACO Left 12/04/2019   Procedure: CATARACT EXTRACTION PHACO AND INTRAOCULAR LENS PLACEMENT LEFT EYE;  Surgeon: Harrie Agent, MD;  Location: [facility];  Service: Ophthalmology;  Laterality: Left;  CDE: 14.59   COLONOSCOPY  09/16/2007   MFM:Jwjo canal hemorrhoids, otherwise normal rectum left-sided diverticula and colonic mucosa appeared normal.   COLONOSCOPY N/A 03/02/2013   Dr. Rolfe adenoma. Colonic diverticulosis. Anal canal hemorrhoids-likely source of hematochezia. Surveillance 2019   COLONOSCOPY WITH PROPOFOL  N/A 07/29/2017   Non-bleeding internal hemorrhoids, cecal  AVMs.    COLONOSCOPY WITH PROPOFOL  N/A 03/23/2022   Procedure: COLONOSCOPY WITH PROPOFOL ;  Surgeon: Shaaron Lamar HERO, MD;  [facility]: [facility];  Service: Endoscopy;  Laterality: N/A;  9:00 am   ESOPHAGOGASTRODUODENOSCOPY  09/16/2007   MFM:Jrrzwuljuzi undulating Z-line, tiny distal esophageal  erosions consistent with mild erosive reflux esophagitis, patulous EG junction, small hiatal hernia, otherwise normal stomach, D1, and D2.   ESOPHAGOGASTRODUODENOSCOPY (EGD) WITH PROPOFOL  N/A 01/08/2022   Procedure: ESOPHAGOGASTRODUODENOSCOPY (EGD) WITH PROPOFOL ;  Surgeon: Shaaron Lamar HERO, MD;  [facility];  Service: Endoscopy;  Laterality: N/A;  2:30 pm   HEMORRHOID SURGERY     X 2    MALONEY DILATION N/A 01/08/2022   Procedure: MALONEY DILATION;  Surgeon: Shaaron Lamar HERO, MD;  [facility]: [facility];  Service: Endoscopy;  Laterality: N/A;   POLYPECTOMY  03/23/2022   Procedure: POLYPECTOMY;  Surgeon: Shaaron Lamar HERO, MD;  [facility]: [facility];  Service: Endoscopy;;   Patient Active Problem List   Diagnosis Date Noted   Chronic fatigue 10/10/2023   Chronic gouty arthritis 10/10/2023   Skin sensation disturbance 10/10/2023   Fibromyalgia 10/10/2023   Acute recurrent maxillary sinusitis 09/21/2023   Sensorineural hearing loss, bilateral 09/21/2023   Impacted cerumen of both ears 09/21/2023   Pain in right hip 08/22/2023   Candidal intertrigo 06/24/2023   Arthralgia of right  ankle 02/21/2023   Tinnitus of both ears 01/30/2023   Chronic rhinitis 01/30/2023   Deviated nasal septum 01/30/2023   Hypertrophy of nasal turbinates 01/30/2023   Morbid obesity (HCC) 08/22/2022   Prediabetes 08/22/2022   Edema 02/21/2022   Hypokalemia 02/21/2022   IDA (iron deficiency anemia) 02/20/2022   Elevated alkaline phosphatase level 02/20/2022   Impaired fasting glucose 02/20/2022   Dysphagia 12/14/2021   Arthralgia of right knee 11/08/2021   Hidradenitis suppurativa of anus 11/08/2021   Right  upper quadrant pain 11/08/2021   Stage 3a chronic kidney disease (HCC) 11/08/2021   Nausea 06/09/2021   Polymyalgia rheumatica (HCC) 12/22/2020   Gout 12/15/2020   Acute pharyngitis 10/20/2020   Dyspnea 10/20/2020   Sneezing 10/20/2020   Cough 10/05/2020   Abnormal white blood cell (WBC) count 09/15/2020   Chronic low back pain 09/15/2020   External hemorrhoids 09/15/2020   Generalized anxiety disorder 09/15/2020   Generalized osteoarthritis 09/15/2020   Mild persistent asthma 09/15/2020   Mixed hyperlipidemia 09/15/2020   Obesity 09/15/2020   Allergic rhinitis 09/15/2020   Hyponatremia 06/06/2020   COVID-19 virus infection 06/06/2020   HTN (hypertension) 06/06/2020   Prolapsed internal hemorrhoids, grade 3 05/12/2020   Constipation 04/15/2013   Rectal bleeding 02/11/2013   Gastroesophageal reflux disease 02/11/2013    PCP: Shona Norleen PEDLAR, MD  REFERRING PROVIDER: Margrette Taft BRAVO, MD  REFERRING DIAG: M54.50,G89.29 (ICD-10-CM) - Chronic low back pain, unspecified back pain laterality, unspecified whether sciatica present  Rationale for Evaluation and Treatment: Rehabilitation  THERAPY DIAG:  Low back pain, unspecified back pain laterality, unspecified chronicity, unspecified whether sciatica present  Other symptoms and signs involving the musculoskeletal system  Muscle weakness (generalized)  ONSET DATE: chronic back pain  SUBJECTIVE:                                                                                                                                                                                           SUBJECTIVE STATEMENT: Upper back feels sore and stiff today. Mid back is uncomfortable. 4/10. Reports it feels like she's got a band wrapped around her. Reports little to no pain in low back    Evaluation: History of back surgery years ago; chronic back pain.  Went to Dr. Margrette about her back; he took x-rays and referred to therapy.  History of right  side weakness from the back and then has had some right ankle issues; fx and multiple sprains; also states she has some leg weakness and perhaps varicose veins; difficulty standing more than 15 min at a time.    PERTINENT HISTORY:  L4-L5 back surgery 1984  Hemorrhoids  History of gout  Allergy  shots/asthma   PAIN:  Are you having pain? Yes: NPRS scale: 4/10 sitting; 9-10/10 with prolonged standing Pain location: low back Pain description: sore and radiating  Aggravating factors: prolonged standing, prolonged sitting Relieving factors: muscle rub, sitting down and rest, walking  PRECAUTIONS: None   WEIGHT BEARING RESTRICTIONS: No  FALLS:  Has patient fallen in last 6 months? No  OCCUPATION: retired Careers adviser  PLOF: Independent  PATIENT GOALS: get stronger and be able to stand more than 15 min at at time  NEXT MD VISIT: PRN  OBJECTIVE:  Note: Objective measures were completed at Evaluation unless otherwise noted.  DIAGNOSTIC FINDINGS:  Lower back pain history of L4-5 surgery   5 views of the spine.  Coronal and sagittal plane abnormal alignment.  Facet arthritis grade 2 spondylo at L4-L5 or L5-S1, difficult to tell spot films suggested L5-S1   Impression grade 2 spondylolisthesis chronic degenerative changes in the facet joints spondylosis    PATIENT SURVEYS:  Modified Oswestry:  MODIFIED OSWESTRY DISABILITY SCALE  Date: 12/02/2023 Score                                Total 22/50; 44%   Interpretation of scores: Score Category Description  0-20% Minimal Disability The patient can cope with most living activities. Usually no treatment is indicated apart from advice on lifting, sitting and exercise  21-40% Moderate Disability The patient experiences more pain and difficulty with sitting, lifting and standing. Travel and social life are more difficult and they may be disabled from work. Personal care, sexual activity and sleeping are not grossly affected, and  the patient can usually be managed by conservative means  41-60% Severe Disability Pain remains the main problem in this group, but activities of daily living are affected. These patients require a detailed investigation  61-80% Crippled Back pain impinges on all aspects of the patient's life. Positive intervention is required  81-100% Bed-bound  These patients are either bed-bound or exaggerating their symptoms  Bluford FORBES Zoe DELENA Karon DELENA, et al. Surgery versus conservative management of stable thoracolumbar fracture: the PRESTO feasibility RCT. [place] ([place]): [employer]; 2021 Nov. [facility] Technology Assessment, No. 25.62.) Appendix 3, Oswestry Disability Index category descriptors. Available from: FindJewel[employer]cz  Minimally Clinically Important Difference (MCID) = 12.8%  COGNITION: Overall cognitive status: Within functional limits for tasks assessed     SENSATION: Pain down right leg; numbness down right leg    POSTURE: rounded shoulders and forward head  PALPATION: General soreness lumbar paraspinals right side glutes and piriformis  LUMBAR ROM: *pain  AROM eval  Flexion Fingertips to 1 above ankle* low back  Extension 20% available* more painful than forwards  Right lateral flexion Fingertips to knee joint line  Left lateral flexion Fingertips to knee joint line  Right rotation   Left rotation    (Blank rows = not tested)  LOWER EXTREMITY MMT:    MMT Right eval Left eval  Hip flexion 4- 4+  Hip extension 4- 4-  Hip abduction    Hip adduction    Hip internal rotation    Hip external rotation    Knee flexion 4 4  Knee extension 4 4+  Ankle dorsiflexion 4 4+  Ankle plantarflexion    Ankle inversion    Ankle eversion     (Blank rows = not tested)  FUNCTIONAL TESTS:  5 times sit to stand: 31.38  sec no UE assist  GAIT: Distance walked: 60 ft in clinic Assistive device utilized: None Level of assistance: Complete  Independence Comments: decreased gait speed  TREATMENT DATE:  12/25/23:  STM to mid thoracic, 15', more on R side Cat/cow in quadruped, 10 reps, discontinued due to wrist pain Open books in side-lying, 10x each side LTR, 2x10 Piriformis stretch, 30x2 each side  Seated hamstring stretch, 2x30  12/18/2023  Treadmill, 3 minutes, grade 2 incline, 0.6 mph; cues for increased stride Supine:   bridges 10X5 holds one set, second set with GTB at knees,10X5 second holds LTR 2 set of 10 reps bilaterally, pt cued to remain in pain free ROM Modified crunch, 1 set of 8 reps, 3 second holds, pt cued for breathing pattern Quadruped: Birddog, 1 set of 5 reps, pt cued for neutral spine and increased core control Side plank, left side harder, 3 reps of 5 seconds on each side, modified elbow and knee Sit to stands with 5# KB pushout, 2 sets of 5 reps, pt cued for core activation  12/13/2023  Therapeutic Exercise: -Treadmill, 3 minutes, grade 2 incline, pt cued increased heel strike and distance from monitor -Supine bridges with GTB at knees, 1 sets of 10 reps, 5 second holds, pt cued for breathing during core contraction -Lateral stepping 3 laps 20 steps per lap, with RTB around ankles, pt cued for upright posture Neuromuscular Re-education: -Modified crunch, 1 set of 8 reps, 3 second holds, pt cued for breathing pattern -Birddog, 1 set of 5 reps, pt cued for neutral spine and increased core control -Side plank, left side harder, 3 reps of 15 seconds on each side -LTR 2 set of 10 reps bilaterally, pt cued to remain in pain free ROM -Sit to stands with tidal tank sphere pushout, 2 sets of 5 reps, pt cued for core activation   PATIENT EDUCATION:  Education details: Patient educated on exam findings, POC, scope of PT, HEP, and what to expect next visit. Person educated: Patient Education method: Explanation, Demonstration, and Handouts Education comprehension: verbalized understanding, returned  demonstration, verbal cues required, and tactile cues required  HOME EXERCISE PROGRAM:  Access Code: [email or web address] URL: [email or web address] Date: 12/02/2023 Prepared by: [facility] - [facility] Exercises - Supine Posterior Pelvic Tilt  - 2 x daily - 7 x weekly - 1 sets - 10 reps - 3 sec hold   -Decompression exercises 1-5   Access Code: [email or web address] URL: [email or web address] Date: 12/05/2023 Prepared by: Greig Fuse Exercises - Seated Hamstring Stretch with Chair  - 2 x daily - 7 x weekly - 3 reps - 30 sec hold -hip excursions 3 motions 10X each (no bouncing in stretch)  ASSESSMENT:  CLINICAL IMPRESSION: Patient reports increased soreness and stiffness this date in upper and mid-back. Began session with STM to area. Patient reports improvements with stiffness following Remainder of session spent focused on mobility of low to mid-back as well as LE as pt reports increased tightness throughout. Added piriformis in session today. Pt catching cramping in RLE following first round but remains well during second set. At end of session pt reports feeling loosened but RLE feeling heavier after cramping. Patient will continue to benefit from skilled physical therapy for decreased low back pain, increased endurance with ambulation, increased core/LE strength, and improved balance for improved quality of life, improved efficiency with gait training and continued progress towards therapy goals.    OBJECTIVE IMPAIRMENTS: Abnormal gait, decreased mobility, decreased ROM, decreased strength, impaired perceived functional ability, postural dysfunction, obesity, and  pain.   ACTIVITY LIMITATIONS: carrying, lifting, bending, sitting, standing, squatting, sleeping, bathing, and locomotion level  PARTICIPATION LIMITATIONS: meal prep, cleaning, shopping, and community activity  REHAB POTENTIAL: Good  CLINICAL DECISION MAKING: Evolving/moderate complexity  EVALUATION COMPLEXITY:  Moderate   GOALS: Goals reviewed with patient? No  SHORT TERM GOALS: Target date: 12/16/2023  patient will be independent with initial HEP  Baseline: Goal status: INITIAL  2.  Patient will report 30% improvement overall  Baseline:  Goal status: INITIAL   LONG TERM GOALS: Target date: 12/30/2023  Patient will be independent in self management strategies to improve quality of life and functional outcomes.  Baseline:  Goal status: INITIAL  2.  Patient will report 50% improvement overall  Baseline:  Goal status: INITIAL  3.  Patient will improve 5 times sit to stand score to 15 sec or less to demonstrate improved functional mobility and increased leg strength.     Baseline: 31.38 sec Goal status: INITIAL  4.   Patient will increase bilateral leg MMT's to 5/5 to allow navigation of steps without gait deviation or loss of balance Baseline:  Goal status: INITIAL  5.  Patient will be able to stand x 30 min to cook a meal without back pain >3/10 Baseline:  Goal status: INITIAL   PLAN:  PT FREQUENCY: 2x/week  PT DURATION: 4 weeks  PLANNED INTERVENTIONS: 97164- PT Re-evaluation, 97110-Therapeutic exercises, 97530- Therapeutic activity, 97112- Neuromuscular re-education, 97535- Self Care, [id number]- Manual therapy, [id number]- Gait training, [phone]- Orthotic Fit/training, [phone]- Canalith repositioning, [id number]- Aquatic Therapy, 97760- Splinting, [phone]- Wound care (first 20 sq cm), 97598- Wound care (each additional 20 sq cm)Patient/Family education, Balance training, Stair training, Taping, Dry Needling, Joint mobilization, Joint manipulation, Spinal manipulation, Spinal mobilization, Scar mobilization, and DME instructions. SABRA  PLAN FOR NEXT SESSION:  continue to progress core stabilization, postural strengthening and reduce pain and dysfunction.     12:38 PM, 12/25/23 Rosaria Settler, PT, DPT [facility] [place]

## 2023-12-27 ENCOUNTER — Ambulatory Visit (HOSPITAL_COMMUNITY)

## 2023-12-27 ENCOUNTER — Ambulatory Visit (INDEPENDENT_AMBULATORY_CARE_PROVIDER_SITE_OTHER)

## 2023-12-27 DIAGNOSIS — M6281 Muscle weakness (generalized): Secondary | ICD-10-CM

## 2023-12-27 DIAGNOSIS — R29898 Other symptoms and signs involving the musculoskeletal system: Secondary | ICD-10-CM

## 2023-12-27 DIAGNOSIS — J309 Allergic rhinitis, unspecified: Secondary | ICD-10-CM

## 2023-12-27 DIAGNOSIS — M545 Low back pain, unspecified: Secondary | ICD-10-CM

## 2023-12-27 NOTE — Therapy (Signed)
 OUTPATIENT PHYSICAL THERAPY THORACOLUMBAR TREATMENT/PROGRESS NOTE Progress Note Reporting Period 12/02/23 to 12/27/23  See note below for Objective Data and Assessment of Progress/Goals.       Patient Name: Kendra Orozco MRN: [id number] DOB:Jan 10, 1946, 78 y.o., female Today's Date: 12/27/2023  END OF SESSION:  PT End of Session - 12/27/23 1017     Visit Number 7    Number of Visits 16    Date for PT Re-Evaluation 01/24/24    Authorization Type HTA    Authorization Time Period no auth needed    PT Start Time 1018    PT Stop Time 1058    PT Time Calculation (min) 40 min    Activity Tolerance Patient tolerated treatment well    Behavior During Therapy WFL for tasks assessed/performed             Past Medical History:  Diagnosis Date   Angio-edema    Anxiety    Arthritis    Asthma    Cancer (HCC)    skin and colon   GERD (gastroesophageal reflux disease)    Gout    Hypertension    Urticaria    Past Surgical History:  Procedure Laterality Date   ABDOMINAL HYSTERECTOMY     fibroid tumors   APPENDECTOMY     BACK SURGERY     BIOPSY  01/08/2022   Procedure: BIOPSY;  Surgeon: Shaaron Lamar HERO, MD;  [facility]: [facility];  Service: Endoscopy;;   BREAST CYST EXCISION Right    CATARACT EXTRACTION W/PHACO Right 11/20/2019   Procedure: CATARACT EXTRACTION PHACO AND INTRAOCULAR LENS PLACEMENT (IOC) RIGHT EYE;  Surgeon: Harrie Agent, MD;  Location: [facility];  Service: Ophthalmology;  Laterality: Right;  CDE: 17.30   CATARACT EXTRACTION W/PHACO Left 12/04/2019   Procedure: CATARACT EXTRACTION PHACO AND INTRAOCULAR LENS PLACEMENT LEFT EYE;  Surgeon: Harrie Agent, MD;  Location: [facility];  Service: Ophthalmology;  Laterality: Left;  CDE: 14.59   COLONOSCOPY  09/16/2007   MFM:Jwjo canal hemorrhoids, otherwise normal rectum left-sided diverticula and colonic mucosa appeared normal.   COLONOSCOPY N/A 03/02/2013   Dr. Rolfe adenoma. Colonic diverticulosis. Anal canal  hemorrhoids-likely source of hematochezia. Surveillance 2019   COLONOSCOPY WITH PROPOFOL  N/A 07/29/2017   Non-bleeding internal hemorrhoids, cecal AVMs.    COLONOSCOPY WITH PROPOFOL  N/A 03/23/2022   Procedure: COLONOSCOPY WITH PROPOFOL ;  Surgeon: Shaaron Lamar HERO, MD;  [facility]: [facility];  Service: Endoscopy;  Laterality: N/A;  9:00 am   ESOPHAGOGASTRODUODENOSCOPY  09/16/2007   MFM:Jrrzwuljuzi undulating Z-line, tiny distal esophageal  erosions consistent with mild erosive reflux esophagitis, patulous EG junction, small hiatal hernia, otherwise normal stomach, D1, and D2.   ESOPHAGOGASTRODUODENOSCOPY (EGD) WITH PROPOFOL  N/A 01/08/2022   Procedure: ESOPHAGOGASTRODUODENOSCOPY (EGD) WITH PROPOFOL ;  Surgeon: Shaaron Lamar HERO, MD;  [facility];  Service: Endoscopy;  Laterality: N/A;  2:30 pm   HEMORRHOID SURGERY     X 2    MALONEY DILATION N/A 01/08/2022   Procedure: MALONEY DILATION;  Surgeon: Shaaron Lamar HERO, MD;  [facility]: [facility];  Service: Endoscopy;  Laterality: N/A;   POLYPECTOMY  03/23/2022   Procedure: POLYPECTOMY;  Surgeon: Shaaron Lamar HERO, MD;  [facility]: [facility];  Service: Endoscopy;;   Patient Active Problem List   Diagnosis Date Noted   Chronic fatigue 10/10/2023   Chronic gouty arthritis 10/10/2023   Skin sensation disturbance 10/10/2023   Fibromyalgia 10/10/2023   Acute recurrent maxillary sinusitis 09/21/2023   Sensorineural hearing loss, bilateral 09/21/2023  Impacted cerumen of both ears 09/21/2023   Pain in right hip 08/22/2023   Candidal intertrigo 06/24/2023   Arthralgia of right ankle 02/21/2023   Tinnitus of both ears 01/30/2023   Chronic rhinitis 01/30/2023   Deviated nasal septum 01/30/2023   Hypertrophy of nasal turbinates 01/30/2023   Morbid obesity (HCC) 08/22/2022   Prediabetes 08/22/2022   Edema 02/21/2022   Hypokalemia 02/21/2022   IDA (iron deficiency anemia) 02/20/2022   Elevated alkaline phosphatase level 02/20/2022   Impaired  fasting glucose 02/20/2022   Dysphagia 12/14/2021   Arthralgia of right knee 11/08/2021   Hidradenitis suppurativa of anus 11/08/2021   Right upper quadrant pain 11/08/2021   Stage 3a chronic kidney disease (HCC) 11/08/2021   Nausea 06/09/2021   Polymyalgia rheumatica (HCC) 12/22/2020   Gout 12/15/2020   Acute pharyngitis 10/20/2020   Dyspnea 10/20/2020   Sneezing 10/20/2020   Cough 10/05/2020   Abnormal white blood cell (WBC) count 09/15/2020   Chronic low back pain 09/15/2020   External hemorrhoids 09/15/2020   Generalized anxiety disorder 09/15/2020   Generalized osteoarthritis 09/15/2020   Mild persistent asthma 09/15/2020   Mixed hyperlipidemia 09/15/2020   Obesity 09/15/2020   Allergic rhinitis 09/15/2020   Hyponatremia 06/06/2020   COVID-19 virus infection 06/06/2020   HTN (hypertension) 06/06/2020   Prolapsed internal hemorrhoids, grade 3 05/12/2020   Constipation 04/15/2013   Rectal bleeding 02/11/2013   Gastroesophageal reflux disease 02/11/2013    PCP: Shona Norleen PEDLAR, MD  REFERRING PROVIDER: Margrette Taft BRAVO, MD  REFERRING DIAG: M54.50,G89.29 (ICD-10-CM) - Chronic low back pain, unspecified back pain laterality, unspecified whether sciatica present  Rationale for Evaluation and Treatment: Rehabilitation  THERAPY DIAG:  Low back pain, unspecified back pain laterality, unspecified chronicity, unspecified whether sciatica present - Plan: PT plan of care cert/re-cert  Other symptoms and signs involving the musculoskeletal system - Plan: PT plan of care cert/re-cert  Muscle weakness (generalized) - Plan: PT plan of care cert/re-cert  ONSET DATE: chronic back pain  SUBJECTIVE:                                                                                                                                                                                           SUBJECTIVE STATEMENT: Woke up with her back stiff this morning.  Overall feeling 45 to 50%  better  since starting therapy.  Her right knee and ankle are bothering her this morning.     Evaluation: History of back surgery years ago; chronic back pain.  Went to Dr. Margrette about her back; he took x-rays and referred to therapy.  History of right side weakness from the back  and then has had some right ankle issues; fx and multiple sprains; also states she has some leg weakness and perhaps varicose veins; difficulty standing more than 15 min at a time.    PERTINENT HISTORY:  L4-L5 back surgery 1984  Hemorrhoids History of gout  Allergy  shots/asthma   PAIN:  Are you having pain? Yes: NPRS scale: 4/10 sitting; 9-10/10 with prolonged standing Pain location: low back Pain description: sore and radiating  Aggravating factors: prolonged standing, prolonged sitting Relieving factors: muscle rub, sitting down and rest, walking  PRECAUTIONS: None   WEIGHT BEARING RESTRICTIONS: No  FALLS:  Has patient fallen in last 6 months? No  OCCUPATION: retired Careers adviser  PLOF: Independent  PATIENT GOALS: get stronger and be able to stand more than 15 min at at time  NEXT MD VISIT: PRN  OBJECTIVE:  Note: Objective measures were completed at Evaluation unless otherwise noted.  DIAGNOSTIC FINDINGS:  Lower back pain history of L4-5 surgery   5 views of the spine.  Coronal and sagittal plane abnormal alignment.  Facet arthritis grade 2 spondylo at L4-L5 or L5-S1, difficult to tell spot films suggested L5-S1   Impression grade 2 spondylolisthesis chronic degenerative changes in the facet joints spondylosis    PATIENT SURVEYS:  Modified Oswestry:  MODIFIED OSWESTRY DISABILITY SCALE  Date: 12/02/2023 Score                                Total 22/50; 44%   Interpretation of scores: Score Category Description  0-20% Minimal Disability The patient can cope with most living activities. Usually no treatment is indicated apart from advice on lifting, sitting and exercise  21-40%  Moderate Disability The patient experiences more pain and difficulty with sitting, lifting and standing. Travel and social life are more difficult and they may be disabled from work. Personal care, sexual activity and sleeping are not grossly affected, and the patient can usually be managed by conservative means  41-60% Severe Disability Pain remains the main problem in this group, but activities of daily living are affected. These patients require a detailed investigation  61-80% Crippled Back pain impinges on all aspects of the patient's life. Positive intervention is required  81-100% Bed-bound  These patients are either bed-bound or exaggerating their symptoms  Bluford FORBES Zoe DELENA Karon DELENA, et al. Surgery versus conservative management of stable thoracolumbar fracture: the PRESTO feasibility RCT. [place] ([place]): [employer]; 2021 Nov. [facility] Technology Assessment, No. 25.62.) Appendix 3, Oswestry Disability Index category descriptors. Available from: [employer]cz  Minimally Clinically Important Difference (MCID) = 12.8%  COGNITION: Overall cognitive status: Within functional limits for tasks assessed     SENSATION: Pain down right leg; numbness down right leg    POSTURE: rounded shoulders and forward head  PALPATION: General soreness lumbar paraspinals right side glutes and piriformis  LUMBAR ROM: *pain  AROM eval 12/27/23  Flexion Fingertips to 1 above ankle* low back Fingertips to ankle  Extension 20% available* more painful than forwards 30% available  Right lateral flexion Fingertips to knee joint line   Left lateral flexion Fingertips to knee joint line   Right rotation    Left rotation     (Blank rows = not tested)  LOWER EXTREMITY MMT:    MMT Right eval Left eval Right 12/27/23 Left 12/27/23  Hip flexion 4- 4+ 4+ 4+  Hip extension 4- 4- 4+ 4  Hip abduction  Hip adduction      Hip internal rotation      Hip  external rotation      Knee flexion 4 4 4+ 4+  Knee extension 4 4+ 5 5  Ankle dorsiflexion 4 4+ 5 5  Ankle plantarflexion      Ankle inversion      Ankle eversion       (Blank rows = not tested)  FUNCTIONAL TESTS:  5 times sit to stand: 31.38 sec no UE assist  GAIT: Distance walked: 60 ft in clinic Assistive device utilized: None Level of assistance: Complete Independence Comments: decreased gait speed  TREATMENT DATE:  12/27/23 Progress note Modified [place] 20/50 40% (improved from 22/50 at eval) 5 times sit to stand 16.31 sec no UE assist MMT's see above  AROM of lumbar spine see above Goal review Supine: LTR x 10 Abdominal bracing 5 hold x 10 Bridge (caused a cramp so discontinued) Glute sets 5 x 10    12/25/23:  STM to mid thoracic, 15', more on R side Cat/cow in quadruped, 10 reps, discontinued due to wrist pain Open books in side-lying, 10x each side LTR, 2x10 Piriformis stretch, 30x2 each side  Seated hamstring stretch, 2x30  12/18/2023  Treadmill, 3 minutes, grade 2 incline, 0.6 mph; cues for increased stride Supine:   bridges 10X5 holds one set, second set with GTB at knees,10X5 second holds LTR 2 set of 10 reps bilaterally, pt cued to remain in pain free ROM Modified crunch, 1 set of 8 reps, 3 second holds, pt cued for breathing pattern Quadruped: Birddog, 1 set of 5 reps, pt cued for neutral spine and increased core control Side plank, left side harder, 3 reps of 5 seconds on each side, modified elbow and knee Sit to stands with 5# KB pushout, 2 sets of 5 reps, pt cued for core activation  12/13/2023  Therapeutic Exercise: -Treadmill, 3 minutes, grade 2 incline, pt cued increased heel strike and distance from monitor -Supine bridges with GTB at knees, 1 sets of 10 reps, 5 second holds, pt cued for breathing during core contraction -Lateral stepping 3 laps 20 steps per lap, with RTB around ankles, pt cued for upright posture Neuromuscular  Re-education: -Modified crunch, 1 set of 8 reps, 3 second holds, pt cued for breathing pattern -Birddog, 1 set of 5 reps, pt cued for neutral spine and increased core control -Side plank, left side harder, 3 reps of 15 seconds on each side -LTR 2 set of 10 reps bilaterally, pt cued to remain in pain free ROM -Sit to stands with tidal tank sphere pushout, 2 sets of 5 reps, pt cued for core activation   PATIENT EDUCATION:  Education details: Patient educated on exam findings, POC, scope of PT, HEP, and what to expect next visit. Person educated: Patient Education method: Explanation, Demonstration, and Handouts Education comprehension: verbalized understanding, returned demonstration, verbal cues required, and tactile cues required  HOME EXERCISE PROGRAM:  Access Code: [email or web address] URL: [email or web address] Date: 12/02/2023 Prepared by: [facility] - Rehab Exercises - Supine Posterior Pelvic Tilt  - 2 x daily - 7 x weekly - 1 sets - 10 reps - 3 sec hold   -Decompression exercises 1-5   Access Code: [email or web address] URL: [email or web address] Date: 12/05/2023 Prepared by: Greig Fuse Exercises - Seated Hamstring Stretch with Chair  - 2 x daily - 7 x weekly - 3 reps - 30 sec hold -hip excursions 3 motions 10X each (no bouncing in stretch)  ASSESSMENT:  CLINICAL IMPRESSION:  Progress note today; met STG's and made good progress towards LTG's. Trial of bridge to address hip extension weakness but caused a cramp so discontinued.   Patient will benefit from continued skilled therapy services to address remaining unmet and partially met goals.     OBJECTIVE IMPAIRMENTS: Abnormal gait, decreased mobility, decreased ROM, decreased strength, impaired perceived functional ability, postural dysfunction, obesity, and pain.   ACTIVITY LIMITATIONS: carrying, lifting, bending, sitting, standing, squatting, sleeping, bathing, and locomotion level  PARTICIPATION LIMITATIONS: meal prep,  cleaning, shopping, and community activity  REHAB POTENTIAL: Good  CLINICAL DECISION MAKING: Evolving/moderate complexity  EVALUATION COMPLEXITY: Moderate   GOALS: Goals reviewed with patient? No  SHORT TERM GOALS: Target date: 12/16/2023  patient will be independent with initial HEP  Baseline: Goal status: met  2.  Patient will report 30% improvement overall  Baseline:  Goal status: met   LONG TERM GOALS: Target date: 12/30/2023  Patient will be independent in self management strategies to improve quality of life and functional outcomes.  Baseline:  Goal status: in progress  2.  Patient will report 50% improvement overall  Baseline:  Goal status: in progress  3.  Patient will improve 5 times sit to stand score to 15 sec or less to demonstrate improved functional mobility and increased leg strength.     Baseline: 31.38 sec; 12/27/23 16.31 sec Goal status: in progress  4.   Patient will increase bilateral leg MMT's to 5/5 to allow navigation of steps without gait deviation or loss of balance Baseline:  Goal status: in progress   5.  Patient will be able to stand x 30 min to cook a meal without back pain >3/10 Baseline: 12/27/23 can stand 15 to 20 min Goal status: in progress   PLAN:  PT FREQUENCY: 2x/week  PT DURATION: 4 weeks  PLANNED INTERVENTIONS: 97164- PT Re-evaluation, 97110-Therapeutic exercises, 97530- Therapeutic activity, 97112- Neuromuscular re-education, 97535- Self Care, [id number]- Manual therapy, [id number]- Gait training, [phone]- Orthotic Fit/training, [phone]- Canalith repositioning, [id number]- Aquatic Therapy, 97760- Splinting, [phone]- Wound care (first 20 sq cm), 97598- Wound care (each additional 20 sq cm)Patient/Family education, Balance training, Stair training, Taping, Dry Needling, Joint mobilization, Joint manipulation, Spinal manipulation, Spinal mobilization, Scar mobilization, and DME instructions. SABRA  PLAN FOR NEXT SESSION:  continue to progress  core stabilization, postural strengthening and reduce pain and dysfunction.  Patient will benefit from continued skilled therapy services to address remaining unmet and partially met goals.    10:58 AM, 12/27/23 Lucie Friedlander Small Amisadai Woodford MPT [place] [place] [phone]

## 2023-12-31 ENCOUNTER — Ambulatory Visit (HOSPITAL_COMMUNITY): Admitting: Physical Therapy

## 2023-12-31 DIAGNOSIS — R29898 Other symptoms and signs involving the musculoskeletal system: Secondary | ICD-10-CM

## 2023-12-31 DIAGNOSIS — M545 Low back pain, unspecified: Secondary | ICD-10-CM | POA: Diagnosis not present

## 2023-12-31 DIAGNOSIS — M6281 Muscle weakness (generalized): Secondary | ICD-10-CM

## 2023-12-31 NOTE — Therapy (Signed)
 OUTPATIENT PHYSICAL THERAPY THORACOLUMBAR TREATMENT/PROGRESS NOTE     Patient Name: Kendra Orozco MRN: [id number] DOB:July 29, 1945, 78 y.o., female Today's Date: 12/31/2023  END OF SESSION:  PT End of Session - 12/31/23 1021     Visit Number 8    Number of Visits 16    Date for PT Re-Evaluation 01/24/24    Authorization Type HTA    Authorization Time Period no auth needed    PT Start Time 1018    PT Stop Time 1100    PT Time Calculation (min) 42 min    Activity Tolerance Patient tolerated treatment well    Behavior During Therapy WFL for tasks assessed/performed             Past Medical History:  Diagnosis Date   Angio-edema    Anxiety    Arthritis    Asthma    Cancer (HCC)    skin and colon   GERD (gastroesophageal reflux disease)    Gout    Hypertension    Urticaria    Past Surgical History:  Procedure Laterality Date   ABDOMINAL HYSTERECTOMY     fibroid tumors   APPENDECTOMY     BACK SURGERY     BIOPSY  01/08/2022   Procedure: BIOPSY;  Surgeon: Shaaron Lamar HERO, MD;  [facility]: [facility];  Service: Endoscopy;;   BREAST CYST EXCISION Right    CATARACT EXTRACTION W/PHACO Right 11/20/2019   Procedure: CATARACT EXTRACTION PHACO AND INTRAOCULAR LENS PLACEMENT (IOC) RIGHT EYE;  Surgeon: Harrie Agent, MD;  Location: [facility];  Service: Ophthalmology;  Laterality: Right;  CDE: 17.30   CATARACT EXTRACTION W/PHACO Left 12/04/2019   Procedure: CATARACT EXTRACTION PHACO AND INTRAOCULAR LENS PLACEMENT LEFT EYE;  Surgeon: Harrie Agent, MD;  Location: [facility];  Service: Ophthalmology;  Laterality: Left;  CDE: 14.59   COLONOSCOPY  09/16/2007   MFM:Jwjo canal hemorrhoids, otherwise normal rectum left-sided diverticula and colonic mucosa appeared normal.   COLONOSCOPY N/A 03/02/2013   Dr. Rolfe adenoma. Colonic diverticulosis. Anal canal hemorrhoids-likely source of hematochezia. Surveillance 2019   COLONOSCOPY WITH PROPOFOL  N/A 07/29/2017   Non-bleeding internal  hemorrhoids, cecal AVMs.    COLONOSCOPY WITH PROPOFOL  N/A 03/23/2022   Procedure: COLONOSCOPY WITH PROPOFOL ;  Surgeon: Shaaron Lamar HERO, MD;  Location: [facility];  Service: Endoscopy;  Laterality: N/A;  9:00 am   ESOPHAGOGASTRODUODENOSCOPY  09/16/2007   MFM:Jrrzwuljuzi undulating Z-line, tiny distal esophageal  erosions consistent with mild erosive reflux esophagitis, patulous EG junction, small hiatal hernia, otherwise normal stomach, D1, and D2.   ESOPHAGOGASTRODUODENOSCOPY (EGD) WITH PROPOFOL  N/A 01/08/2022   Procedure: ESOPHAGOGASTRODUODENOSCOPY (EGD) WITH PROPOFOL ;  Surgeon: Shaaron Lamar HERO, MD;  [facility];  Service: Endoscopy;  Laterality: N/A;  2:30 pm   HEMORRHOID SURGERY     X 2    MALONEY DILATION N/A 01/08/2022   Procedure: MALONEY DILATION;  Surgeon: Shaaron Lamar HERO, MD;  [facility]: [facility];  Service: Endoscopy;  Laterality: N/A;   POLYPECTOMY  03/23/2022   Procedure: POLYPECTOMY;  Surgeon: Shaaron Lamar HERO, MD;  [facility]: [facility];  Service: Endoscopy;;   Patient Active Problem List   Diagnosis Date Noted   Chronic fatigue 10/10/2023   Chronic gouty arthritis 10/10/2023   Skin sensation disturbance 10/10/2023   Fibromyalgia 10/10/2023   Acute recurrent maxillary sinusitis 09/21/2023   Sensorineural hearing loss, bilateral 09/21/2023   Impacted cerumen of both ears 09/21/2023   Pain in right hip 08/22/2023   Candidal intertrigo 06/24/2023  Arthralgia of right ankle 02/21/2023   Tinnitus of both ears 01/30/2023   Chronic rhinitis 01/30/2023   Deviated nasal septum 01/30/2023   Hypertrophy of nasal turbinates 01/30/2023   Morbid obesity (HCC) 08/22/2022   Prediabetes 08/22/2022   Edema 02/21/2022   Hypokalemia 02/21/2022   IDA (iron deficiency anemia) 02/20/2022   Elevated alkaline phosphatase level 02/20/2022   Impaired fasting glucose 02/20/2022   Dysphagia 12/14/2021   Arthralgia of right knee 11/08/2021   Hidradenitis suppurativa of anus  11/08/2021   Right upper quadrant pain 11/08/2021   Stage 3a chronic kidney disease (HCC) 11/08/2021   Nausea 06/09/2021   Polymyalgia rheumatica (HCC) 12/22/2020   Gout 12/15/2020   Acute pharyngitis 10/20/2020   Dyspnea 10/20/2020   Sneezing 10/20/2020   Cough 10/05/2020   Abnormal white blood cell (WBC) count 09/15/2020   Chronic low back pain 09/15/2020   External hemorrhoids 09/15/2020   Generalized anxiety disorder 09/15/2020   Generalized osteoarthritis 09/15/2020   Mild persistent asthma 09/15/2020   Mixed hyperlipidemia 09/15/2020   Obesity 09/15/2020   Allergic rhinitis 09/15/2020   Hyponatremia 06/06/2020   COVID-19 virus infection 06/06/2020   HTN (hypertension) 06/06/2020   Prolapsed internal hemorrhoids, grade 3 05/12/2020   Constipation 04/15/2013   Rectal bleeding 02/11/2013   Gastroesophageal reflux disease 02/11/2013    PCP: Shona Norleen PEDLAR, MD  REFERRING PROVIDER: Margrette Taft BRAVO, MD  REFERRING DIAG: M54.50,G89.29 (ICD-10-CM) - Chronic low back pain, unspecified back pain laterality, unspecified whether sciatica present  Rationale for Evaluation and Treatment: Rehabilitation  THERAPY DIAG:  Low back pain, unspecified back pain laterality, unspecified chronicity, unspecified whether sciatica present  Other symptoms and signs involving the musculoskeletal system  Muscle weakness (generalized)  ONSET DATE: chronic back pain  SUBJECTIVE:                                                                                                                                                                                           SUBJECTIVE STATEMENT: Pt states she mowed for 2 hours yesterday so is a little stiff and tight today. No more increased pain today. States her right knee and ankle are better today but wore her ankle brace yesterday.      Evaluation: History of back surgery years ago; chronic back pain.  Went to Dr. Margrette about her back; he took  x-rays and referred to therapy.  History of right side weakness from the back and then has had some right ankle issues; fx and multiple sprains; also states she has some leg weakness and perhaps varicose veins; difficulty standing more than 15 min at a time.  PERTINENT HISTORY:  L4-L5 back surgery 1984  Hemorrhoids History of gout  Allergy  shots/asthma   PAIN:  Are you having pain? Yes: NPRS scale: 4/10 sitting; 9-10/10 with prolonged standing Pain location: low back Pain description: sore and radiating  Aggravating factors: prolonged standing, prolonged sitting Relieving factors: muscle rub, sitting down and rest, walking  PRECAUTIONS: None   WEIGHT BEARING RESTRICTIONS: No  FALLS:  Has patient fallen in last 6 months? No  OCCUPATION: retired Careers adviser  PLOF: Independent  PATIENT GOALS: get stronger and be able to stand more than 15 min at at time  NEXT MD VISIT: PRN  OBJECTIVE:  Note: Objective measures were completed at Evaluation unless otherwise noted.  DIAGNOSTIC FINDINGS:  Lower back pain history of L4-5 surgery   5 views of the spine.  Coronal and sagittal plane abnormal alignment.  Facet arthritis grade 2 spondylo at L4-L5 or L5-S1, difficult to tell spot films suggested L5-S1   Impression grade 2 spondylolisthesis chronic degenerative changes in the facet joints spondylosis    PATIENT SURVEYS:  Modified Oswestry:  MODIFIED OSWESTRY DISABILITY SCALE  Date: 12/02/2023 Score                                Total 22/50; 44%   Interpretation of scores: Score Category Description  0-20% Minimal Disability The patient can cope with most living activities. Usually no treatment is indicated apart from advice on lifting, sitting and exercise  21-40% Moderate Disability The patient experiences more pain and difficulty with sitting, lifting and standing. Travel and social life are more difficult and they may be disabled from work. Personal care, sexual  activity and sleeping are not grossly affected, and the patient can usually be managed by conservative means  41-60% Severe Disability Pain remains the main problem in this group, but activities of daily living are affected. These patients require a detailed investigation  61-80% Crippled Back pain impinges on all aspects of the patient's life. Positive intervention is required  81-100% Bed-bound  These patients are either bed-bound or exaggerating their symptoms  Bluford FORBES Zoe DELENA Karon DELENA, et al. Surgery versus conservative management of stable thoracolumbar fracture: the PRESTO feasibility RCT. [place] ([place]): [employer]; 2021 Nov. [facility] Technology Assessment, No. 25.62.) Appendix 3, Oswestry Disability Index category descriptors. Available from: [employer]el[employer]cz  Minimally Clinically Important Difference (MCID) = 12.8%  COGNITION: Overall cognitive status: Within functional limits for tasks assessed     SENSATION: Pain down right leg; numbness down right leg    POSTURE: rounded shoulders and forward head  PALPATION: General soreness lumbar paraspinals right side glutes and piriformis  LUMBAR ROM: *pain  AROM eval 12/27/23  Flexion Fingertips to 1 above ankle* low back Fingertips to ankle  Extension 20% available* more painful than forwards 30% available  Right lateral flexion Fingertips to knee joint line   Left lateral flexion Fingertips to knee joint line   Right rotation    Left rotation     (Blank rows = not tested)  LOWER EXTREMITY MMT:    MMT Right eval Left eval Right 12/27/23 Left 12/27/23  Hip flexion 4- 4+ 4+ 4+  Hip extension 4- 4- 4+ 4  Hip abduction      Hip adduction      Hip internal rotation      Hip external rotation      Knee flexion 4 4 4+ 4+  Knee extension 4 4+  5 5  Ankle dorsiflexion 4 4+ 5 5  Ankle plantarflexion      Ankle inversion      Ankle eversion       (Blank rows = not  tested)  FUNCTIONAL TESTS:  5 times sit to stand: 31.38 sec no UE assist  GAIT: Distance walked: 60 ft in clinic Assistive device utilized: None Level of assistance: Complete Independence Comments: decreased gait speed  TREATMENT DATE:  12/31/2023  Treadmill, 5 minutes, grade 2 incline, 0.8 mph; cues for increased stride Standing:  RTB scapular retraction 10X  RTB Rows 10X  RTB shoulder extension 10X  Lat stretch bilaterally Supine:   bridges unable today due to cramping LTR 2 set of 10 reps bilaterally, pt cued to remain in pain free ROM Abdominal isometrics 20 reps, 3 second holds, pt cued for breathing pattern Quadruped: Birddog, 1 set of 5 reps, pt cued for neutral spine and increased core control Sit to stands with 5# KB pushout, 2 sets of 5 reps, pt cued for core activation   12/27/23 Progress note Modified [place] 20/50 40% (improved from 22/50 at eval) 5 times sit to stand 16.31 sec no UE assist MMT's see above  AROM of lumbar spine see above Goal review Supine: LTR x 10 Abdominal bracing 5 hold x 10 Bridge (caused a cramp so discontinued) Glute sets 5 x 10   12/25/23:  STM to mid thoracic, 15', more on R side Cat/cow in quadruped, 10 reps, discontinued due to wrist pain Open books in side-lying, 10x each side LTR, 2x10 Piriformis stretch, 30x2 each side  Seated hamstring stretch, 2x30  12/18/2023  Treadmill, 3 minutes, grade 2 incline, 0.6 mph; cues for increased stride Supine:   bridges 10X5 holds one set, second set with GTB at knees,10X5 second holds LTR 2 set of 10 reps bilaterally, pt cued to remain in pain free ROM Modified crunch, 1 set of 8 reps, 3 second holds, pt cued for breathing pattern Quadruped: Birddog, 1 set of 5 reps, pt cued for neutral spine and increased core control Side plank, left side harder, 3 reps of 5 seconds on each side, modified elbow and knee Sit to stands with 5# KB pushout, 2 sets of 5 reps, pt cued for core  activation   PATIENT EDUCATION:  Education details: Patient educated on exam findings, POC, scope of PT, HEP, and what to expect next visit. Person educated: Patient Education method: Explanation, Demonstration, and Handouts Education comprehension: verbalized understanding, returned demonstration, verbal cues required, and tactile cues required  HOME EXERCISE PROGRAM:  Access Code: [email or web address] URL: [email or web address] Date: 12/02/2023 Prepared by: [facility] - [facility] Exercises - Supine Posterior Pelvic Tilt  - 2 x daily - 7 x weekly - 1 sets - 10 reps - 3 sec hold   -Decompression exercises 1-5   Access Code: [email or web address] URL: [email or web address] Date: 12/05/2023 Prepared by: Greig Fuse Exercises - Seated Hamstring Stretch with Chair  - 2 x daily - 7 x weekly - 3 reps - 30 sec hold -hip excursions 3 motions 10X each (no bouncing in stretch)  ASSESSMENT:  CLINICAL IMPRESSION: Increased time and speed on treadmill today. Pt was more winded following but no pain or issues.  Began postural strengthening. Some cramping on Lt side of lumbar but able to stretch and relieve this.  Pt with some dizziness following quadruped activity but admits to not breathing correctly with activity.  Pt sat up and hydrated and this went away.   Worked on stretches due  to increased soreness voiced this session.  Patient will benefit from continued skilled therapy services to address remaining unmet and partially met goals.     OBJECTIVE IMPAIRMENTS: Abnormal gait, decreased mobility, decreased ROM, decreased strength, impaired perceived functional ability, postural dysfunction, obesity, and pain.   ACTIVITY LIMITATIONS: carrying, lifting, bending, sitting, standing, squatting, sleeping, bathing, and locomotion level  PARTICIPATION LIMITATIONS: meal prep, cleaning, shopping, and community activity  REHAB POTENTIAL: Good  CLINICAL DECISION MAKING: Evolving/moderate  complexity  EVALUATION COMPLEXITY: Moderate   GOALS: Goals reviewed with patient? No  SHORT TERM GOALS: Target date: 12/16/2023  patient will be independent with initial HEP  Baseline: Goal status: met  2.  Patient will report 30% improvement overall  Baseline:  Goal status: met   LONG TERM GOALS: Target date: 12/30/2023  Patient will be independent in self management strategies to improve quality of life and functional outcomes.  Baseline:  Goal status: in progress  2.  Patient will report 50% improvement overall  Baseline:  Goal status: in progress  3.  Patient will improve 5 times sit to stand score to 15 sec or less to demonstrate improved functional mobility and increased leg strength.     Baseline: 31.38 sec; 12/27/23 16.31 sec Goal status: in progress  4.   Patient will increase bilateral leg MMT's to 5/5 to allow navigation of steps without gait deviation or loss of balance Baseline:  Goal status: in progress   5.  Patient will be able to stand x 30 min to cook a meal without back pain >3/10 Baseline: 12/27/23 can stand 15 to 20 min Goal status: in progress   PLAN:  PT FREQUENCY: 2x/week  PT DURATION: 4 weeks  PLANNED INTERVENTIONS: 97164- PT Re-evaluation, 97110-Therapeutic exercises, 97530- Therapeutic activity, 97112- Neuromuscular re-education, 97535- Self Care, [id number]- Manual therapy, [id number]- Gait training, [phone]- Orthotic Fit/training, [phone]- Canalith repositioning, [id number]- Aquatic Therapy, 97760- Splinting, [phone]- Wound care (first 20 sq cm), 97598- Wound care (each additional 20 sq cm)Patient/Family education, Balance training, Stair training, Taping, Dry Needling, Joint mobilization, Joint manipulation, Spinal manipulation, Spinal mobilization, Scar mobilization, and DME instructions. SABRA  PLAN FOR NEXT SESSION:  continue to progress core stabilization, postural strengthening and reduce pain and dysfunction.  Patient will benefit from continued  skilled therapy services to address remaining unmet and partially met goals.    10:22 AM, 12/31/23 Greig KATHEE Fuse, PTA/CLT [facility] Ph: [phone]

## 2024-01-02 ENCOUNTER — Ambulatory Visit (HOSPITAL_COMMUNITY)

## 2024-01-02 DIAGNOSIS — R29898 Other symptoms and signs involving the musculoskeletal system: Secondary | ICD-10-CM

## 2024-01-02 DIAGNOSIS — M6281 Muscle weakness (generalized): Secondary | ICD-10-CM

## 2024-01-02 DIAGNOSIS — M545 Low back pain, unspecified: Secondary | ICD-10-CM

## 2024-01-02 NOTE — Therapy (Signed)
 OUTPATIENT PHYSICAL THERAPY THORACOLUMBAR TREATMENT     Patient Name: Kendra Orozco MRN: [id number] DOB:December 23, 1945, 78 y.o., female Today's Date: 01/02/2024  END OF SESSION:  PT End of Session - 01/02/24 1143     Visit Number 9    Number of Visits 16    Date for Recertification  01/24/24    Authorization Type HTA    Authorization Time Period no auth needed    PT Start Time 1144    PT Stop Time 1224    PT Time Calculation (min) 40 min    Activity Tolerance Patient tolerated treatment well    Behavior During Therapy WFL for tasks assessed/performed             Past Medical History:  Diagnosis Date   Angio-edema    Anxiety    Arthritis    Asthma    Cancer (HCC)    skin and colon   GERD (gastroesophageal reflux disease)    Gout    Hypertension    Urticaria    Past Surgical History:  Procedure Laterality Date   ABDOMINAL HYSTERECTOMY     fibroid tumors   APPENDECTOMY     BACK SURGERY     BIOPSY  01/08/2022   Procedure: BIOPSY;  Surgeon: Shaaron Lamar HERO, MD;  [facility]: [facility];  Service: Endoscopy;;   BREAST CYST EXCISION Right    CATARACT EXTRACTION W/PHACO Right 11/20/2019   Procedure: CATARACT EXTRACTION PHACO AND INTRAOCULAR LENS PLACEMENT (IOC) RIGHT EYE;  Surgeon: Harrie Agent, MD;  Location: [facility];  Service: Ophthalmology;  Laterality: Right;  CDE: 17.30   CATARACT EXTRACTION W/PHACO Left 12/04/2019   Procedure: CATARACT EXTRACTION PHACO AND INTRAOCULAR LENS PLACEMENT LEFT EYE;  Surgeon: Harrie Agent, MD;  Location: [facility];  Service: Ophthalmology;  Laterality: Left;  CDE: 14.59   COLONOSCOPY  09/16/2007   MFM:Jwjo canal hemorrhoids, otherwise normal rectum left-sided diverticula and colonic mucosa appeared normal.   COLONOSCOPY N/A 03/02/2013   Dr. Rolfe adenoma. Colonic diverticulosis. Anal canal hemorrhoids-likely source of hematochezia. Surveillance 2019   COLONOSCOPY WITH PROPOFOL  N/A 07/29/2017   Non-bleeding internal hemorrhoids,  cecal AVMs.    COLONOSCOPY WITH PROPOFOL  N/A 03/23/2022   Procedure: COLONOSCOPY WITH PROPOFOL ;  Surgeon: Shaaron Lamar HERO, MD;  [facility]: [facility];  Service: Endoscopy;  Laterality: N/A;  9:00 am   ESOPHAGOGASTRODUODENOSCOPY  09/16/2007   MFM:Jrrzwuljuzi undulating Z-line, tiny distal esophageal  erosions consistent with mild erosive reflux esophagitis, patulous EG junction, small hiatal hernia, otherwise normal stomach, D1, and D2.   ESOPHAGOGASTRODUODENOSCOPY (EGD) WITH PROPOFOL  N/A 01/08/2022   Procedure: ESOPHAGOGASTRODUODENOSCOPY (EGD) WITH PROPOFOL ;  Surgeon: Shaaron Lamar HERO, MD;  [facility];  Service: Endoscopy;  Laterality: N/A;  2:30 pm   HEMORRHOID SURGERY     X 2    MALONEY DILATION N/A 01/08/2022   Procedure: MALONEY DILATION;  Surgeon: Shaaron Lamar HERO, MD;  [facility]: [facility];  Service: Endoscopy;  Laterality: N/A;   POLYPECTOMY  03/23/2022   Procedure: POLYPECTOMY;  Surgeon: Shaaron Lamar HERO, MD;  [facility]: [facility];  Service: Endoscopy;;   Patient Active Problem List   Diagnosis Date Noted   Chronic fatigue 10/10/2023   Chronic gouty arthritis 10/10/2023   Skin sensation disturbance 10/10/2023   Fibromyalgia 10/10/2023   Acute recurrent maxillary sinusitis 09/21/2023   Sensorineural hearing loss, bilateral 09/21/2023   Impacted cerumen of both ears 09/21/2023   Pain in right hip 08/22/2023   Candidal intertrigo 06/24/2023   Arthralgia  of right ankle 02/21/2023   Tinnitus of both ears 01/30/2023   Chronic rhinitis 01/30/2023   Deviated nasal septum 01/30/2023   Hypertrophy of nasal turbinates 01/30/2023   Morbid obesity (HCC) 08/22/2022   Prediabetes 08/22/2022   Edema 02/21/2022   Hypokalemia 02/21/2022   IDA (iron deficiency anemia) 02/20/2022   Elevated alkaline phosphatase level 02/20/2022   Impaired fasting glucose 02/20/2022   Dysphagia 12/14/2021   Arthralgia of right knee 11/08/2021   Hidradenitis suppurativa of anus 11/08/2021    Right upper quadrant pain 11/08/2021   Stage 3a chronic kidney disease (HCC) 11/08/2021   Nausea 06/09/2021   Polymyalgia rheumatica (HCC) 12/22/2020   Gout 12/15/2020   Acute pharyngitis 10/20/2020   Dyspnea 10/20/2020   Sneezing 10/20/2020   Cough 10/05/2020   Abnormal white blood cell (WBC) count 09/15/2020   Chronic low back pain 09/15/2020   External hemorrhoids 09/15/2020   Generalized anxiety disorder 09/15/2020   Generalized osteoarthritis 09/15/2020   Mild persistent asthma 09/15/2020   Mixed hyperlipidemia 09/15/2020   Obesity 09/15/2020   Allergic rhinitis 09/15/2020   Hyponatremia 06/06/2020   COVID-19 virus infection 06/06/2020   HTN (hypertension) 06/06/2020   Prolapsed internal hemorrhoids, grade 3 05/12/2020   Constipation 04/15/2013   Rectal bleeding 02/11/2013   Gastroesophageal reflux disease 02/11/2013    PCP: Shona Norleen PEDLAR, MD  REFERRING PROVIDER: Margrette Taft BRAVO, MD  REFERRING DIAG: M54.50,G89.29 (ICD-10-CM) - Chronic low back pain, unspecified back pain laterality, unspecified whether sciatica present  Rationale for Evaluation and Treatment: Rehabilitation  THERAPY DIAG:  Low back pain, unspecified back pain laterality, unspecified chronicity, unspecified whether sciatica present  Other symptoms and signs involving the musculoskeletal system  Muscle weakness (generalized)  ONSET DATE: chronic back pain  SUBJECTIVE:                                                                                                                                                                                           SUBJECTIVE STATEMENT: Feeling stiff this morning; back is hurting on left side more than right 3-4/10.    Evaluation: History of back surgery years ago; chronic back pain.  Went to Dr. Margrette about her back; he took x-rays and referred to therapy.  History of right side weakness from the back and then has had some right ankle issues; fx and  multiple sprains; also states she has some leg weakness and perhaps varicose veins; difficulty standing more than 15 min at a time.    PERTINENT HISTORY:  L4-L5 back surgery 1984  Hemorrhoids History of gout  Allergy  shots/asthma   PAIN:  Are you having pain? Yes:  NPRS scale: 4/10 sitting; 9-10/10 with prolonged standing Pain location: low back Pain description: sore and radiating  Aggravating factors: prolonged standing, prolonged sitting Relieving factors: muscle rub, sitting down and rest, walking  PRECAUTIONS: None   WEIGHT BEARING RESTRICTIONS: No  FALLS:  Has patient fallen in last 6 months? No  OCCUPATION: retired Careers adviser  PLOF: Independent  PATIENT GOALS: get stronger and be able to stand more than 15 min at at time  NEXT MD VISIT: PRN  OBJECTIVE:  Note: Objective measures were completed at Evaluation unless otherwise noted.  DIAGNOSTIC FINDINGS:  Lower back pain history of L4-5 surgery   5 views of the spine.  Coronal and sagittal plane abnormal alignment.  Facet arthritis grade 2 spondylo at L4-L5 or L5-S1, difficult to tell spot films suggested L5-S1   Impression grade 2 spondylolisthesis chronic degenerative changes in the facet joints spondylosis    PATIENT SURVEYS:  Modified Oswestry:  MODIFIED OSWESTRY DISABILITY SCALE  Date: 12/02/2023 Score                                Total 22/50; 44%   Interpretation of scores: Score Category Description  0-20% Minimal Disability The patient can cope with most living activities. Usually no treatment is indicated apart from advice on lifting, sitting and exercise  21-40% Moderate Disability The patient experiences more pain and difficulty with sitting, lifting and standing. Travel and social life are more difficult and they may be disabled from work. Personal care, sexual activity and sleeping are not grossly affected, and the patient can usually be managed by conservative means  41-60% Severe  Disability Pain remains the main problem in this group, but activities of daily living are affected. These patients require a detailed investigation  61-80% Crippled Back pain impinges on all aspects of the patient's life. Positive intervention is required  81-100% Bed-bound  These patients are either bed-bound or exaggerating their symptoms  Bluford FORBES Zoe DELENA Karon DELENA, et al. Surgery versus conservative management of stable thoracolumbar fracture: the PRESTO feasibility RCT. [place] ([place]): [employer]; 2021 Nov. [facility] Technology Assessment, No. 25.62.) Appendix 3, Oswestry Disability Index category descriptors. Available from: FindJewel[employer].cz  Minimally Clinically Important Difference (MCID) = 12.8%  COGNITION: Overall cognitive status: Within functional limits for tasks assessed     SENSATION: Pain down right leg; numbness down right leg    POSTURE: rounded shoulders and forward head  PALPATION: General soreness lumbar paraspinals right side glutes and piriformis  LUMBAR ROM: *pain  AROM eval 12/27/23  Flexion Fingertips to 1 above ankle* low back Fingertips to ankle  Extension 20% available* more painful than forwards 30% available  Right lateral flexion Fingertips to knee joint line   Left lateral flexion Fingertips to knee joint line   Right rotation    Left rotation     (Blank rows = not tested)  LOWER EXTREMITY MMT:    MMT Right eval Left eval Right 12/27/23 Left 12/27/23  Hip flexion 4- 4+ 4+ 4+  Hip extension 4- 4- 4+ 4  Hip abduction      Hip adduction      Hip internal rotation      Hip external rotation      Knee flexion 4 4 4+ 4+  Knee extension 4 4+ 5 5  Ankle dorsiflexion 4 4+ 5 5  Ankle plantarflexion      Ankle inversion  Ankle eversion       (Blank rows = not tested)  FUNCTIONAL TESTS:  5 times sit to stand: 31.38 sec no UE assist  GAIT: Distance walked: 60 ft in clinic Assistive device  utilized: None Level of assistance: Complete Independence Comments: decreased gait speed  TREATMENT DATE: 01/01/2014 Supine with legs on wedge in decompressed position x 5' to decrease pain with instruction on relaxation breathing LTR x 10 each Abdominal bracing 5 hold x 10 Nustep seat 7 x 5' level 2 Standing  scapular retractions Shoulder extensions Heel raises    12/31/2023  Treadmill, 5 minutes, grade 2 incline, 0.8 mph; cues for increased stride Standing:  RTB scapular retraction 10X  RTB Rows 10X  RTB shoulder extension 10X  Lat stretch bilaterally Supine:   bridges unable today due to cramping LTR 2 set of 10 reps bilaterally, pt cued to remain in pain free ROM Abdominal isometrics 20 reps, 3 second holds, pt cued for breathing pattern Quadruped: Birddog, 1 set of 5 reps, pt cued for neutral spine and increased core control Sit to stands with 5# KB pushout, 2 sets of 5 reps, pt cued for core activation   12/27/23 Progress note Modified [place] 20/50 40% (improved from 22/50 at eval) 5 times sit to stand 16.31 sec no UE assist MMT's see above  AROM of lumbar spine see above Goal review Supine: LTR x 10 Abdominal bracing 5 hold x 10 Bridge (caused a cramp so discontinued) Glute sets 5 x 10   12/25/23:  STM to mid thoracic, 15', more on R side Cat/cow in quadruped, 10 reps, discontinued due to wrist pain Open books in side-lying, 10x each side LTR, 2x10 Piriformis stretch, 30x2 each side  Seated hamstring stretch, 2x30  12/18/2023  Treadmill, 3 minutes, grade 2 incline, 0.6 mph; cues for increased stride Supine:   bridges 10X5 holds one set, second set with GTB at knees,10X5 second holds LTR 2 set of 10 reps bilaterally, pt cued to remain in pain free ROM Modified crunch, 1 set of 8 reps, 3 second holds, pt cued for breathing pattern Quadruped: Birddog, 1 set of 5 reps, pt cued for neutral spine and increased core control Side plank, left side harder, 3  reps of 5 seconds on each side, modified elbow and knee Sit to stands with 5# KB pushout, 2 sets of 5 reps, pt cued for core activation   PATIENT EDUCATION:  Education details: Patient educated on exam findings, POC, scope of PT, HEP, and what to expect next visit. Person educated: Patient Education method: Explanation, Demonstration, and Handouts Education comprehension: verbalized understanding, returned demonstration, verbal cues required, and tactile cues required  HOME EXERCISE PROGRAM:  Access Code: [email or web address] URL: [email or web address] Date: 12/02/2023 Prepared by: [facility] - Rehab Exercises - Supine Posterior Pelvic Tilt  - 2 x daily - 7 x weekly - 1 sets - 10 reps - 3 sec hold   -Decompression exercises 1-5   Access Code: [email or web address] URL: [email or web address] Date: 12/05/2023 Prepared by: Greig Fuse Exercises - Seated Hamstring Stretch with Chair  - 2 x daily - 7 x weekly - 3 reps - 30 sec hold -hip excursions 3 motions 10X each (no bouncing in stretch)  ASSESSMENT:  CLINICAL IMPRESSION: Today's session with focus on spinal mobility, core strengthening and functional mobility.  Patient continues to cramp with trial of bridge.  Needs cues with retractions to avoid exaggerated movement; good technique; issued red theraband for home use and updated HEP.  Patient will benefit  from continued skilled therapy services to address remaining unmet and partially met goals.     OBJECTIVE IMPAIRMENTS: Abnormal gait, decreased mobility, decreased ROM, decreased strength, impaired perceived functional ability, postural dysfunction, obesity, and pain.   ACTIVITY LIMITATIONS: carrying, lifting, bending, sitting, standing, squatting, sleeping, bathing, and locomotion level  PARTICIPATION LIMITATIONS: meal prep, cleaning, shopping, and community activity  REHAB POTENTIAL: Good  CLINICAL DECISION MAKING: Evolving/moderate complexity  EVALUATION COMPLEXITY:  Moderate   GOALS: Goals reviewed with patient? No  SHORT TERM GOALS: Target date: 12/16/2023  patient will be independent with initial HEP  Baseline: Goal status: met  2.  Patient will report 30% improvement overall  Baseline:  Goal status: met   LONG TERM GOALS: Target date: 12/30/2023  Patient will be independent in self management strategies to improve quality of life and functional outcomes.  Baseline:  Goal status: in progress  2.  Patient will report 50% improvement overall  Baseline:  Goal status: in progress  3.  Patient will improve 5 times sit to stand score to 15 sec or less to demonstrate improved functional mobility and increased leg strength.     Baseline: 31.38 sec; 12/27/23 16.31 sec Goal status: in progress  4.   Patient will increase bilateral leg MMT's to 5/5 to allow navigation of steps without gait deviation or loss of balance Baseline:  Goal status: in progress   5.  Patient will be able to stand x 30 min to cook a meal without back pain >3/10 Baseline: 12/27/23 can stand 15 to 20 min Goal status: in progress   PLAN:  PT FREQUENCY: 2x/week  PT DURATION: 4 weeks  PLANNED INTERVENTIONS: [id number]- PT Re-evaluation, 97110-Therapeutic exercises, 97530- Therapeutic activity, 97112- Neuromuscular re-education, 97535- Self Care, [id number]- Manual therapy, [id number]- Gait training, [phone]- Orthotic Fit/training, [phone]- Canalith repositioning, [id number]- Aquatic Therapy, 97760- Splinting, [phone]- Wound care (first 20 sq cm), 97598- Wound care (each additional 20 sq cm)Patient/Family education, Balance training, Stair training, Taping, Dry Needling, Joint mobilization, Joint manipulation, Spinal manipulation, Spinal mobilization, Scar mobilization, and DME instructions. SABRA  PLAN FOR NEXT SESSION:  continue to progress core stabilization, postural strengthening and reduce pain and dysfunction.  Patient will benefit from continued skilled therapy services to address  remaining unmet and partially met goals.    12:29 PM, 01/02/24 Greig KATHEE Fuse, PTA/CLT [facility] Outpatient Rehabilitation [facility] Ph: [phone]

## 2024-01-08 ENCOUNTER — Encounter (HOSPITAL_COMMUNITY): Payer: Self-pay

## 2024-01-08 ENCOUNTER — Ambulatory Visit (INDEPENDENT_AMBULATORY_CARE_PROVIDER_SITE_OTHER): Admitting: Allergy & Immunology

## 2024-01-08 ENCOUNTER — Ambulatory Visit (HOSPITAL_COMMUNITY)

## 2024-01-08 ENCOUNTER — Encounter: Payer: Self-pay | Admitting: Allergy & Immunology

## 2024-01-08 ENCOUNTER — Other Ambulatory Visit: Payer: Self-pay

## 2024-01-08 VITALS — BP 108/62 | HR 114 | Temp 97.8°F | Resp 18 | Ht 62.99 in | Wt 214.0 lb

## 2024-01-08 DIAGNOSIS — J45909 Unspecified asthma, uncomplicated: Secondary | ICD-10-CM | POA: Diagnosis not present

## 2024-01-08 DIAGNOSIS — J4540 Moderate persistent asthma, uncomplicated: Secondary | ICD-10-CM

## 2024-01-08 DIAGNOSIS — B999 Unspecified infectious disease: Secondary | ICD-10-CM | POA: Diagnosis not present

## 2024-01-08 DIAGNOSIS — J302 Other seasonal allergic rhinitis: Secondary | ICD-10-CM | POA: Diagnosis not present

## 2024-01-08 DIAGNOSIS — J3089 Other allergic rhinitis: Secondary | ICD-10-CM | POA: Diagnosis not present

## 2024-01-08 DIAGNOSIS — M545 Low back pain, unspecified: Secondary | ICD-10-CM | POA: Diagnosis not present

## 2024-01-08 DIAGNOSIS — R0982 Postnasal drip: Secondary | ICD-10-CM | POA: Diagnosis not present

## 2024-01-08 DIAGNOSIS — R29898 Other symptoms and signs involving the musculoskeletal system: Secondary | ICD-10-CM

## 2024-01-08 DIAGNOSIS — J309 Allergic rhinitis, unspecified: Secondary | ICD-10-CM

## 2024-01-08 DIAGNOSIS — J45998 Other asthma: Secondary | ICD-10-CM | POA: Diagnosis not present

## 2024-01-08 DIAGNOSIS — M6281 Muscle weakness (generalized): Secondary | ICD-10-CM

## 2024-01-08 DIAGNOSIS — K219 Gastro-esophageal reflux disease without esophagitis: Secondary | ICD-10-CM

## 2024-01-08 MED ORDER — IPRATROPIUM-ALBUTEROL 0.5-2.5 (3) MG/3ML IN SOLN
3.0000 mL | Freq: Once | RESPIRATORY_TRACT | Status: AC
  Administered 2024-01-08: 3 mL via RESPIRATORY_TRACT

## 2024-01-08 NOTE — Therapy (Signed)
 OUTPATIENT PHYSICAL THERAPY THORACOLUMBAR TREATMENT     Patient Name: JOEE IOVINE MRN: [id number] DOB:09-24-1945, 78 y.o., female Today's Date: 01/08/2024  END OF SESSION:  PT End of Session - 01/08/24 0947     Visit Number 10    Number of Visits 16    Date for Recertification  01/24/24    Authorization Type HTA    Authorization Time Period no auth needed    PT Start Time 0947    PT Stop Time 1025    PT Time Calculation (min) 38 min    Activity Tolerance Patient tolerated treatment well    Behavior During Therapy WFL for tasks assessed/performed              Past Medical History:  Diagnosis Date   Angio-edema    Anxiety    Arthritis    Asthma    Cancer (HCC)    skin and colon   GERD (gastroesophageal reflux disease)    Gout    Hypertension    Urticaria    Past Surgical History:  Procedure Laterality Date   ABDOMINAL HYSTERECTOMY     fibroid tumors   APPENDECTOMY     BACK SURGERY     BIOPSY  01/08/2022   Procedure: BIOPSY;  Surgeon: Shaaron Lamar HERO, MD;  [facility];  Service: Endoscopy;;   BREAST CYST EXCISION Right    CATARACT EXTRACTION W/PHACO Right 11/20/2019   Procedure: CATARACT EXTRACTION PHACO AND INTRAOCULAR LENS PLACEMENT (IOC) RIGHT EYE;  Surgeon: Harrie Agent, MD;  Location: [facility];  Service: Ophthalmology;  Laterality: Right;  CDE: 17.30   CATARACT EXTRACTION W/PHACO Left 12/04/2019   Procedure: CATARACT EXTRACTION PHACO AND INTRAOCULAR LENS PLACEMENT LEFT EYE;  Surgeon: Harrie Agent, MD;  Location: [facility];  Service: Ophthalmology;  Laterality: Left;  CDE: 14.59   COLONOSCOPY  09/16/2007   MFM:Jwjo canal hemorrhoids, otherwise normal rectum left-sided diverticula and colonic mucosa appeared normal.   COLONOSCOPY N/A 03/02/2013   Dr. Rolfe adenoma. Colonic diverticulosis. Anal canal hemorrhoids-likely source of hematochezia. Surveillance 2019   COLONOSCOPY WITH PROPOFOL  N/A 07/29/2017   Non-bleeding internal hemorrhoids,  cecal AVMs.    COLONOSCOPY WITH PROPOFOL  N/A 03/23/2022   Procedure: COLONOSCOPY WITH PROPOFOL ;  Surgeon: Shaaron Lamar HERO, MD;  [facility];  Service: Endoscopy;  Laterality: N/A;  9:00 am   ESOPHAGOGASTRODUODENOSCOPY  09/16/2007   MFM:Jrrzwuljuzi undulating Z-line, tiny distal esophageal  erosions consistent with mild erosive reflux esophagitis, patulous EG junction, small hiatal hernia, otherwise normal stomach, D1, and D2.   ESOPHAGOGASTRODUODENOSCOPY (EGD) WITH PROPOFOL  N/A 01/08/2022   Procedure: ESOPHAGOGASTRODUODENOSCOPY (EGD) WITH PROPOFOL ;  Surgeon: Shaaron Lamar HERO, MD;  [facility];  Service: Endoscopy;  Laterality: N/A;  2:30 pm   HEMORRHOID SURGERY     X 2    MALONEY DILATION N/A 01/08/2022   Procedure: MALONEY DILATION;  Surgeon: Shaaron Lamar HERO, MD;  [facility];  Service: Endoscopy;  Laterality: N/A;   POLYPECTOMY  03/23/2022   Procedure: POLYPECTOMY;  Surgeon: Shaaron Lamar HERO, MD;  [facility];  Service: Endoscopy;;   Patient Active Problem List   Diagnosis Date Noted   Chronic fatigue 10/10/2023   Chronic gouty arthritis 10/10/2023   Skin sensation disturbance 10/10/2023   Fibromyalgia 10/10/2023   Acute recurrent maxillary sinusitis 09/21/2023   Sensorineural hearing loss, bilateral 09/21/2023   Impacted cerumen of both ears 09/21/2023   Pain in right hip 08/22/2023   Candidal intertrigo 06/24/2023  Arthralgia of right ankle 02/21/2023   Tinnitus of both ears 01/30/2023   Chronic rhinitis 01/30/2023   Deviated nasal septum 01/30/2023   Hypertrophy of nasal turbinates 01/30/2023   Morbid obesity (HCC) 08/22/2022   Prediabetes 08/22/2022   Edema 02/21/2022   Hypokalemia 02/21/2022   IDA (iron deficiency anemia) 02/20/2022   Elevated alkaline phosphatase level 02/20/2022   Impaired fasting glucose 02/20/2022   Dysphagia 12/14/2021   Arthralgia of right knee 11/08/2021   Hidradenitis suppurativa of anus 11/08/2021    Right upper quadrant pain 11/08/2021   Stage 3a chronic kidney disease (HCC) 11/08/2021   Nausea 06/09/2021   Polymyalgia rheumatica 12/22/2020   Gout 12/15/2020   Acute pharyngitis 10/20/2020   Dyspnea 10/20/2020   Sneezing 10/20/2020   Cough 10/05/2020   Abnormal white blood cell (WBC) count 09/15/2020   Chronic low back pain 09/15/2020   External hemorrhoids 09/15/2020   Generalized anxiety disorder 09/15/2020   Generalized osteoarthritis 09/15/2020   Mild persistent asthma 09/15/2020   Mixed hyperlipidemia 09/15/2020   Obesity 09/15/2020   Allergic rhinitis 09/15/2020   Hyponatremia 06/06/2020   COVID-19 virus infection 06/06/2020   HTN (hypertension) 06/06/2020   Prolapsed internal hemorrhoids, grade 3 05/12/2020   Constipation 04/15/2013   Rectal bleeding 02/11/2013   Gastroesophageal reflux disease 02/11/2013    PCP: Shona Norleen PEDLAR, MD  REFERRING PROVIDER: Margrette Taft BRAVO, MD  REFERRING DIAG: M54.50,G89.29 (ICD-10-CM) - Chronic low back pain, unspecified back pain laterality, unspecified whether sciatica present  Rationale for Evaluation and Treatment: Rehabilitation  THERAPY DIAG:  Low back pain, unspecified back pain laterality, unspecified chronicity, unspecified whether sciatica present  Other symptoms and signs involving the musculoskeletal system  Muscle weakness (generalized)  ONSET DATE: chronic back pain  SUBJECTIVE:                                                                                                                                                                                           SUBJECTIVE STATEMENT: Pt states back is feeling stiff this morning in the back, no pain this morning but left anterior thigh was bothering her last night. Pt states therapy has been helping her back, 75% better. Pt states balance, her walking, and is able to roll over in bed much better since the start of therapy.   Evaluation: History of back surgery  years ago; chronic back pain.  Went to Dr. Margrette about her back; he took x-rays and referred to therapy.  History of right side weakness from the back and then has had some right ankle issues; fx and multiple sprains; also states she has some leg weakness and  perhaps varicose veins; difficulty standing more than 15 min at a time.    PERTINENT HISTORY:  L4-L5 back surgery 1984  Hemorrhoids History of gout  Allergy  shots/asthma   PAIN:  Are you having pain? Yes: NPRS scale: 4/10 sitting; 9-10/10 with prolonged standing Pain location: low back Pain description: sore and radiating  Aggravating factors: prolonged standing, prolonged sitting Relieving factors: muscle rub, sitting down and rest, walking  PRECAUTIONS: None   WEIGHT BEARING RESTRICTIONS: No  FALLS:  Has patient fallen in last 6 months? No  OCCUPATION: retired Careers adviser  PLOF: Independent  PATIENT GOALS: get stronger and be able to stand more than 15 min at at time  NEXT MD VISIT: PRN  OBJECTIVE:  Note: Objective measures were completed at Evaluation unless otherwise noted.  DIAGNOSTIC FINDINGS:  Lower back pain history of L4-5 surgery   5 views of the spine.  Coronal and sagittal plane abnormal alignment.  Facet arthritis grade 2 spondylo at L4-L5 or L5-S1, difficult to tell spot films suggested L5-S1   Impression grade 2 spondylolisthesis chronic degenerative changes in the facet joints spondylosis    PATIENT SURVEYS:  Modified Oswestry:  MODIFIED OSWESTRY DISABILITY SCALE  Date: 12/02/2023 Score                                Total 22/50; 44%   Interpretation of scores: Score Category Description  0-20% Minimal Disability The patient can cope with most living activities. Usually no treatment is indicated apart from advice on lifting, sitting and exercise  21-40% Moderate Disability The patient experiences more pain and difficulty with sitting, lifting and standing. Travel and social life  are more difficult and they may be disabled from work. Personal care, sexual activity and sleeping are not grossly affected, and the patient can usually be managed by conservative means  41-60% Severe Disability Pain remains the main problem in this group, but activities of daily living are affected. These patients require a detailed investigation  61-80% Crippled Back pain impinges on all aspects of the patient's life. Positive intervention is required  81-100% Bed-bound  These patients are either bed-bound or exaggerating their symptoms  Bluford FORBES Zoe DELENA Karon DELENA, et al. Surgery versus conservative management of stable thoracolumbar fracture: the PRESTO feasibility RCT. [place]): [employer]; 2021 Nov. [facility] Technology Assessment, No. 25.62.) Appendix 3, Oswestry Disability Index category descriptors. Available from: FindJewel[employer].cz  Minimally Clinically Important Difference (MCID) = 12.8%  COGNITION: Overall cognitive status: Within functional limits for tasks assessed     SENSATION: Pain down right leg; numbness down right leg    POSTURE: rounded shoulders and forward head  PALPATION: General soreness lumbar paraspinals right side glutes and piriformis  LUMBAR ROM: *pain  AROM eval 12/27/23  Flexion Fingertips to 1 above ankle* low back Fingertips to ankle  Extension 20% available* more painful than forwards 30% available  Right lateral flexion Fingertips to knee joint line   Left lateral flexion Fingertips to knee joint line   Right rotation    Left rotation     (Blank rows = not tested)  LOWER EXTREMITY MMT:    MMT Right eval Left eval Right 12/27/23 Left 12/27/23  Hip flexion 4- 4+ 4+ 4+  Hip extension 4- 4- 4+ 4  Hip abduction      Hip adduction      Hip internal rotation      Hip external rotation  Knee flexion 4 4 4+ 4+  Knee extension 4 4+ 5 5  Ankle dorsiflexion 4 4+ 5 5  Ankle plantarflexion       Ankle inversion      Ankle eversion       (Blank rows = not tested)  FUNCTIONAL TESTS:  5 times sit to stand: 31.38 sec no UE assist  GAIT: Distance walked: 60 ft in clinic Assistive device utilized: None Level of assistance: Complete Independence Comments: decreased gait speed  TREATMENT DATE: 01/08/2024  Therapeutic Exercise: -Nustep, level 4 resistance, pt cued for 70 SPM -Lateral step up and overs 6 inch step, 3lb ankle weights -Standing 3 way hip in parallel bars, 1 set of 5 reps bilateral, 3lb weights -Lat pull downs, 1 set of 8 reps, plate 3, pt cued for eccentric control Neuromuscular Re-education: -Walking marches/walking bwds, 20 foot line, 3lb ankle weights, 2 laps, pt cued for max LE ROM -Pallof press with GTB at chest level, 1 set of 10 reps bilaterally, pt cued for sequencing  -Bird dogs, 1 set of 4 reps, bilaterally pt cued for sequencing and core activation  01/01/2014 Supine with legs on wedge in decompressed position x 5' to decrease pain with instruction on relaxation breathing LTR x 10 each Abdominal bracing 5 hold x 10 Nustep seat 7 x 5' level 2 Standing  scapular retractions Shoulder extensions Heel raises    12/31/2023  Treadmill, 5 minutes, grade 2 incline, 0.8 mph; cues for increased stride Standing:  RTB scapular retraction 10X  RTB Rows 10X  RTB shoulder extension 10X  Lat stretch bilaterally Supine:   bridges unable today due to cramping LTR 2 set of 10 reps bilaterally, pt cued to remain in pain free ROM Abdominal isometrics 20 reps, 3 second holds, pt cued for breathing pattern Quadruped: Birddog, 1 set of 5 reps, pt cued for neutral spine and increased core control Sit to stands with 5# KB pushout, 2 sets of 5 reps, pt cued for core activation    PATIENT EDUCATION:  Education details: Patient educated on exam findings, POC, scope of PT, HEP, and what to expect next visit. Person educated: Patient Education method: Explanation,  Demonstration, and Handouts Education comprehension: verbalized understanding, returned demonstration, verbal cues required, and tactile cues required  HOME EXERCISE PROGRAM:  Access Code: [email or web address] URL: [email or web address] Date: 12/02/2023 Prepared by: [facility] - Rehab Exercises - Supine Posterior Pelvic Tilt  - 2 x daily - 7 x weekly - 1 sets - 10 reps - 3 sec hold   -Decompression exercises 1-5   Access Code: [email or web address] URL: [email or web address] Date: 12/05/2023 Prepared by: Greig Fuse Exercises - Seated Hamstring Stretch with Chair  - 2 x daily - 7 x weekly - 3 reps - 30 sec hold -hip excursions 3 motions 10X each (no bouncing in stretch)  ASSESSMENT:  CLINICAL IMPRESSION: Patient continues to demonstrate low back stiffness and L anterior thigh pain, decreased LLE/core strength, decreased functional mobility and balance. Patient also demonstrates fair endurance with aerobic based exercise during today's session. Patient able to progress dynamic balance and core activation exercises today with lat pull downs and resisted walking, good performance with verbal cueing. Patient would continue to benefit from skilled physical therapy for decreased low back/LLE pain, increased endurance with ambulation, increased LE/core strength, and improved balance for improved quality of life, improved independence with functional mobility and continued progress towards therapy goals.      OBJECTIVE IMPAIRMENTS: Abnormal gait, decreased mobility, decreased ROM, decreased strength, impaired perceived  functional ability, postural dysfunction, obesity, and pain.   ACTIVITY LIMITATIONS: carrying, lifting, bending, sitting, standing, squatting, sleeping, bathing, and locomotion level  PARTICIPATION LIMITATIONS: meal prep, cleaning, shopping, and community activity  REHAB POTENTIAL: Good  CLINICAL DECISION MAKING: Evolving/moderate complexity  EVALUATION COMPLEXITY:  Moderate   GOALS: Goals reviewed with patient? No  SHORT TERM GOALS: Target date: 12/16/2023  patient will be independent with initial HEP  Baseline: Goal status: met  2.  Patient will report 30% improvement overall  Baseline:  Goal status: met   LONG TERM GOALS: Target date: 12/30/2023  Patient will be independent in self management strategies to improve quality of life and functional outcomes.  Baseline:  Goal status: in progress  2.  Patient will report 50% improvement overall  Baseline:  Goal status: in progress  3.  Patient will improve 5 times sit to stand score to 15 sec or less to demonstrate improved functional mobility and increased leg strength.     Baseline: 31.38 sec; 12/27/23 16.31 sec Goal status: in progress  4.   Patient will increase bilateral leg MMT's to 5/5 to allow navigation of steps without gait deviation or loss of balance Baseline:  Goal status: in progress   5.  Patient will be able to stand x 30 min to cook a meal without back pain >3/10 Baseline: 12/27/23 can stand 15 to 20 min Goal status: in progress   PLAN:  PT FREQUENCY: 2x/week  PT DURATION: 4 weeks  PLANNED INTERVENTIONS: 97164- PT Re-evaluation, 97110-Therapeutic exercises, 97530- Therapeutic activity, 97112- Neuromuscular re-education, 97535- Self Care, 02859- Manual therapy, [id number]- Gait training, [id number]- Orthotic Fit/training, [id number]- Canalith repositioning, [id number]- Aquatic Therapy, 97760- Splinting, [id number]- Wound care (first 20 sq cm), 97598- Wound care (each additional 20 sq cm)Patient/Family education, Balance training, Stair training, Taping, Dry Needling, Joint mobilization, Joint manipulation, Spinal manipulation, Spinal mobilization, Scar mobilization, and DME instructions. SABRA  PLAN FOR NEXT SESSION:  continue to progress core stabilization, postural strengthening and reduce pain and dysfunction.  Patient will benefit from continued skilled therapy services to address  remaining unmet and partially met goals.    Lang Ada, PT, DPT [facility] Office: [phone] 10:31 AM, 01/08/24

## 2024-01-08 NOTE — Progress Notes (Unsigned)
 FOLLOW UP  Date of Service/Encounter:  01/08/24   Assessment:   Gastroesophageal reflux disease, unspecified whether esophagitis present  Allergic rhinitis, unspecified seasonality, unspecified trigger  Recurrent infections - Plan: Complement, total, Diphtheria / Tetanus Antibody Panel, IgG, IgA, IgM, Strep pneumoniae 23 Serotypes IgG, CBC With Diff/Platelet  Moderate persistent asthma, uncomplicated - Plan: Spirometry with Graph  Postnasal drip  Plan/Recommendations:   Patient Instructions  1. Moderate persistent asthma, uncomplicated - Lung testing looks excellent today. - Try doing the Symbicort  at least once daily.  - This might help control your bronchitis, but limit your skin side effects.  - Daily controller medication(s): Symbicort  at least once daily  - Prior to physical activity: albuterol  2 puffs 10-15 minutes before physical activity. - Rescue medications: albuterol  4 puffs every 4-6 hours as needed - Asthma control goals:  * Full participation in all desired activities (may need albuterol  before activity) * Albuterol  use two time or less a week on average (not counting use with activity) * Cough interfering with sleep two time or less a month * Oral steroids no more than once a year * No hospitalizations  2. Chronic rhinitis - Previous testing was reactive to grass, molds, dust mites, cats, and dog. - You have reached the Red Vial, so hopefully next year will be much better symptom wise. - Continue with: ipratropium one spray per nostril twice daily as needed like you are doing - We are going to get some labs to look for immune issues.   3. Return in about 6 months (around 07/07/2024). You can have the follow up appointment with Dr. Iva or a Nurse Practicioner (our Nurse Practitioners are excellent and always have Physician oversight!).    Please inform us  of any Emergency Department visits, hospitalizations, or changes in symptoms. Call us  before  going to the ED for breathing or allergy  symptoms since we might be able to fit you in for a sick visit. Feel free to contact us  anytime with any questions, problems, or concerns.  It was a pleasure to see you again today!  Websites that have reliable patient information: 1. American Academy of Asthma, Allergy , and Immunology: www.aaaai.org 2. Food Allergy  Research and Education (FARE): foodallergy.org 3. Mothers of Asthmatics: http://www.asthmacommunitynetwork.org 4. American College of Allergy , Asthma, and Immunology: www.acaai.org      "Like" us  on Facebook and Instagram for our latest updates!      A healthy democracy works best when [employer] participate! Make sure you are registered to vote! If you have moved or changed any of your contact information, you will need to get this updated before voting! Scan the QR codes below to learn more!                Subjective:   ANNEL ZUNKER is a 78 y.o. female presenting today for follow up of  Chief Complaint  Patient presents with   Cough    Coughing spells at night unable to sleep     RESHA FILIPPONE has a history of the following: Patient Active Problem List   Diagnosis Date Noted   Chronic fatigue 10/10/2023   Chronic gouty arthritis 10/10/2023   Skin sensation disturbance 10/10/2023   Fibromyalgia 10/10/2023   Acute recurrent maxillary sinusitis 09/21/2023   Sensorineural hearing loss, bilateral 09/21/2023   Impacted cerumen of both ears 09/21/2023   Pain in right hip 08/22/2023   Candidal intertrigo 06/24/2023   Arthralgia of right ankle 02/21/2023   Tinnitus of both  ears 01/30/2023   Chronic rhinitis 01/30/2023   Deviated nasal septum 01/30/2023   Hypertrophy of nasal turbinates 01/30/2023   Morbid obesity (HCC) 08/22/2022   Prediabetes 08/22/2022   Edema 02/21/2022   Hypokalemia 02/21/2022   IDA (iron deficiency anemia) 02/20/2022   Elevated alkaline phosphatase level 02/20/2022   Impaired  fasting glucose 02/20/2022   Dysphagia 12/14/2021   Arthralgia of right knee 11/08/2021   Hidradenitis suppurativa of anus 11/08/2021   Right upper quadrant pain 11/08/2021   Stage 3a chronic kidney disease (HCC) 11/08/2021   Nausea 06/09/2021   Polymyalgia rheumatica 12/22/2020   Gout 12/15/2020   Acute pharyngitis 10/20/2020   Dyspnea 10/20/2020   Sneezing 10/20/2020   Cough 10/05/2020   Abnormal white blood cell (WBC) count 09/15/2020   Chronic low back pain 09/15/2020   External hemorrhoids 09/15/2020   Generalized anxiety disorder 09/15/2020   Generalized osteoarthritis 09/15/2020   Mild persistent asthma 09/15/2020   Mixed hyperlipidemia 09/15/2020   Obesity 09/15/2020   Allergic rhinitis 09/15/2020   Hyponatremia 06/06/2020   COVID-19 virus infection 06/06/2020   HTN (hypertension) 06/06/2020   Prolapsed internal hemorrhoids, grade 3 05/12/2020   Constipation 04/15/2013   Rectal bleeding 02/11/2013   Gastroesophageal reflux disease 02/11/2013    History obtained from: chart review and {Persons; PED relatives w/patient:19415::patient}.  Discussed the use of AI scribe software for clinical note transcription with the patient and/or guardian, who gave verbal consent to proceed.  Anela is a 78 y.o. female presenting for {Blank single:19197::a food challenge,a drug challenge,skin testing,a sick visit,an evaluation of ***,a follow up visit}.  Asthma/Respiratory Symptom History: ***  Allergic Rhinitis Symptom History: ***  Food Allergy  Symptom History: ***  Skin Symptom History: ***  GERD Symptom History: ***  Infection Symptom History: ***  Otherwise, there have been no changes to her past medical history, surgical history, family history, or social history.    Review of systems otherwise negative other than that mentioned in the HPI.    Objective:   Blood pressure 108/62, pulse (!) 114, temperature 97.8 F (36.6 C), temperature source  Temporal, resp. rate 18, height 5' 2.99 (1.6 m), weight 214 lb (97.1 kg), SpO2 95%. Body mass index is 37.92 kg/m.    Physical Exam   Diagnostic studies:    Spirometry: results normal (FEV1: 1.59/86%, FVC: 2.47/102%, FEV1/FVC: 64%).    Spirometry consistent with normal pattern. {Blank single:19197::Albuterol /Atrovent  nebulizer,Xopenex/Atrovent  nebulizer,Albuterol  nebulizer,Albuterol  four puffs via MDI,Xopenex four puffs via MDI} treatment given in clinic with {Blank single:19197::significant improvement in FEV1 per ATS criteria,significant improvement in FVC per ATS criteria,significant improvement in FEV1 and FVC per ATS criteria,improvement in FEV1, but not significant per ATS criteria,improvement in FVC, but not significant per ATS criteria,improvement in FEV1 and FVC, but not significant per ATS criteria,no improvement}.  Allergy  Studies: {Blank single:19197::none,deferred due to recent antihistamine use,deferred due to insurance stipulations that require a separate visit for testing,labs sent instead, }    {Blank single:19197::Allergy  testing results were read and interpreted by myself, documented by clinical staff., }      Marty Shaggy, MD  [facility] 

## 2024-01-08 NOTE — Patient Instructions (Addendum)
 1. Moderate persistent asthma, uncomplicated - Lung testing looks excellent today. - I would do the Symbicort  two puffs twice daily for a few days to get you through this flare. - After that, try doing the Symbicort  at least once daily every day.  - This might help control your bronchitis, but limit your skin side effects.  - Nebulizer machine provided to use when you are having increased flares.  - Daily controller medication(s): Symbicort  160/4.9mcg one puff once daily at least with spacer  - Prior to physical activity: albuterol  2 puffs 10-15 minutes before physical activity. - Rescue medications: albuterol  4 puffs every 4-6 hours as needed and albuterol  nebulizer one vial every 4-6 hours as needed - Changes during respiratory infections or worsening symptoms: Add on albuterol  mixed with budesonide  one treatment three times daily for ONE TO TWO WEEKS. - Asthma control goals:  * Full participation in all desired activities (may need albuterol  before activity) * Albuterol  use two time or less a week on average (not counting use with activity) * Cough interfering with sleep two time or less a month * Oral steroids no more than once a year * No hospitalizations  2. Chronic rhinitis - Previous testing was reactive to grass, molds, dust mites, cats, and dog. - You have reached the Red Vial, so hopefully next year will be much better symptom wise. - Continue with: ipratropium one spray per nostril twice daily as needed like you are doing - We are going to get some labs to look for immune issues.  - I think this is unlikely, but we will see.   3. Return in about 3 months (around 04/08/2024). You can have the follow up appointment with Dr. Iva or a Nurse Practicioner (our Nurse Practitioners are excellent and always have Physician oversight!).    Please inform us  of any Emergency Department visits, hospitalizations, or changes in symptoms. Call us  before going to the ED for breathing or  allergy  symptoms since we might be able to fit you in for a sick visit. Feel free to contact us  anytime with any questions, problems, or concerns.  It was a pleasure to see you again today!  Websites that have reliable patient information: 1. American Academy of Asthma, Allergy , and Immunology: www.aaaai.org 2. Food Allergy  Research and Education (FARE): foodallergy.org 3. Mothers of Asthmatics: http://www.asthmacommunitynetwork.org 4. [employer] of Allergy , Asthma, and Immunology: www.acaai.org      "Like" us  on Facebook and Instagram for our latest updates!      A healthy democracy works best when [employer] participate! Make sure you are registered to vote! If you have moved or changed any of your contact information, you will need to get this updated before voting! Scan the QR codes below to learn more!

## 2024-01-09 ENCOUNTER — Encounter: Payer: Self-pay | Admitting: Allergy & Immunology

## 2024-01-12 LAB — STREP PNEUMONIAE 23 SEROTYPES IGG
Pneumo Ab Type 1*: 6.1 ug/mL (ref >1.3)
Pneumo Ab Type 12 (12F)*: 0.1 ug/mL — ABNORMAL LOW (ref >1.3)
Pneumo Ab Type 14*: 0.4 ug/mL — ABNORMAL LOW (ref >1.3)
Pneumo Ab Type 17 (17F)*: 0.8 ug/mL — ABNORMAL LOW (ref >1.3)
Pneumo Ab Type 19 (19F)*: 0.8 ug/mL — ABNORMAL LOW (ref >1.3)
Pneumo Ab Type 2*: 0.6 ug/mL — ABNORMAL LOW (ref >1.3)
Pneumo Ab Type 20*: 0.7 ug/mL — ABNORMAL LOW (ref >1.3)
Pneumo Ab Type 22 (22F)*: 0.3 ug/mL — ABNORMAL LOW (ref >1.3)
Pneumo Ab Type 23 (23F)*: 1.5 ug/mL (ref >1.3)
Pneumo Ab Type 26 (6B)*: 0.8 ug/mL — ABNORMAL LOW (ref >1.3)
Pneumo Ab Type 3*: 0.2 ug/mL — ABNORMAL LOW (ref >1.3)
Pneumo Ab Type 34 (10A)*: 1.2 ug/mL — ABNORMAL LOW (ref >1.3)
Pneumo Ab Type 4*: 0.2 ug/mL — ABNORMAL LOW (ref >1.3)
Pneumo Ab Type 43 (11A)*: 0.8 ug/mL — ABNORMAL LOW (ref >1.3)
Pneumo Ab Type 5*: 0.7 ug/mL — ABNORMAL LOW (ref >1.3)
Pneumo Ab Type 51 (7F)*: 2.5 ug/mL (ref >1.3)
Pneumo Ab Type 54 (15B)*: 0.7 ug/mL — ABNORMAL LOW (ref >1.3)
Pneumo Ab Type 56 (18C)*: 0.2 ug/mL — ABNORMAL LOW (ref >1.3)
Pneumo Ab Type 57 (19A)*: 1.2 ug/mL — ABNORMAL LOW (ref >1.3)
Pneumo Ab Type 68 (9V)*: 0.6 ug/mL — ABNORMAL LOW (ref >1.3)
Pneumo Ab Type 70 (33F)*: 0.9 ug/mL — ABNORMAL LOW (ref >1.3)
Pneumo Ab Type 8*: 3.7 ug/mL (ref >1.3)
Pneumo Ab Type 9 (9N)*: 0.5 ug/mL — ABNORMAL LOW (ref >1.3)

## 2024-01-12 LAB — CBC WITH DIFF/PLATELET
Basophils Absolute: 0.1 x10E3/uL (ref 0.0–0.2)
Basos: 1 %
EOS (ABSOLUTE): 0.2 x10E3/uL (ref 0.0–0.4)
Eos: 1 %
Hematocrit: 34.6 % (ref 34.0–46.6)
Hemoglobin: 10.9 g/dL — ABNORMAL LOW (ref 11.1–15.9)
Immature Grans (Abs): 0 x10E3/uL (ref 0.0–0.1)
Immature Granulocytes: 0 %
Lymphocytes Absolute: 4.3 x10E3/uL — ABNORMAL HIGH (ref 0.7–3.1)
Lymphs: 32 %
MCH: 26.8 pg (ref 26.6–33.0)
MCHC: 31.5 g/dL (ref 31.5–35.7)
MCV: 85 fL (ref 79–97)
Monocytes Absolute: 0.9 x10E3/uL (ref 0.1–0.9)
Monocytes: 7 %
Neutrophils Absolute: 8.1 x10E3/uL — ABNORMAL HIGH (ref 1.4–7.0)
Neutrophils: 59 %
Platelets: 303 x10E3/uL (ref 150–450)
RBC: 4.06 x10E6/uL (ref 3.77–5.28)
RDW: 15.1 % (ref 11.7–15.4)
WBC: 13.6 x10E3/uL — ABNORMAL HIGH (ref 3.4–10.8)

## 2024-01-12 LAB — DIPHTHERIA / TETANUS ANTIBODY PANEL
Diphtheria Ab: 0.27 [IU]/mL (ref <0.10)
Tetanus Ab, IgG: 1.14 [IU]/mL (ref <0.10)

## 2024-01-12 LAB — IGG, IGA, IGM
IgA/Immunoglobulin A, Serum: 298 mg/dL (ref 64–422)
IgG (Immunoglobin G), Serum: 879 mg/dL (ref 586–1602)
IgM (Immunoglobulin M), Srm: 79 mg/dL (ref 26–217)

## 2024-01-12 LAB — COMPLEMENT, TOTAL: Compl, Total (CH50): >60 U/mL (ref >41)

## 2024-01-14 ENCOUNTER — Ambulatory Visit (HOSPITAL_COMMUNITY): Admitting: Physical Therapy

## 2024-01-14 DIAGNOSIS — M545 Low back pain, unspecified: Secondary | ICD-10-CM | POA: Diagnosis not present

## 2024-01-14 DIAGNOSIS — M6281 Muscle weakness (generalized): Secondary | ICD-10-CM

## 2024-01-14 DIAGNOSIS — R29898 Other symptoms and signs involving the musculoskeletal system: Secondary | ICD-10-CM

## 2024-01-14 NOTE — Therapy (Signed)
 OUTPATIENT PHYSICAL THERAPY THORACOLUMBAR TREATMENT     Patient Name: Kendra Orozco MRN: [id number] DOB:[birth year], 78 y.o., female Today's Date: 01/14/2024  END OF SESSION:  PT End of Session - 01/14/24 1638     Visit Number 11    Number of Visits 16    Date for Recertification  01/24/24    Authorization Type HTA    Authorization Time Period no auth needed    PT Start Time 1548    PT Stop Time 1633    PT Time Calculation (min) 45 min    Activity Tolerance Patient tolerated treatment well    Behavior During Therapy WFL for tasks assessed/performed               Past Medical History:  Diagnosis Date   Angio-edema    Anxiety    Arthritis    Asthma    Cancer (HCC)    skin and colon   GERD (gastroesophageal reflux disease)    Gout    Hypertension    Urticaria    Past Surgical History:  Procedure Laterality Date   ABDOMINAL HYSTERECTOMY     fibroid tumors   APPENDECTOMY     BACK SURGERY     BIOPSY  01/08/2022   Procedure: BIOPSY;  Surgeon: Shaaron Lamar HERO, MD;  [facility];  Service: Endoscopy;;   BREAST CYST EXCISION Right    CATARACT EXTRACTION W/PHACO Right 11/20/2019   Procedure: CATARACT EXTRACTION PHACO AND INTRAOCULAR LENS PLACEMENT (IOC) RIGHT EYE;  Surgeon: Harrie Agent, MD;  Location: [facility];  Service: Ophthalmology;  Laterality: Right;  CDE: 17.30   CATARACT EXTRACTION W/PHACO Left 12/04/2019   Procedure: CATARACT EXTRACTION PHACO AND INTRAOCULAR LENS PLACEMENT LEFT EYE;  Surgeon: Harrie Agent, MD;  Location: [facility];  Service: Ophthalmology;  Laterality: Left;  CDE: 14.59   COLONOSCOPY  09/16/2007   MFM:Jwjo canal hemorrhoids, otherwise normal rectum left-sided diverticula and colonic mucosa appeared normal.   COLONOSCOPY N/A 03/02/2013   Dr. Rolfe adenoma. Colonic diverticulosis. Anal canal hemorrhoids-likely source of hematochezia. Surveillance 2019   COLONOSCOPY WITH PROPOFOL  N/A 07/29/2017   Non-bleeding internal  hemorrhoids, cecal AVMs.    COLONOSCOPY WITH PROPOFOL  N/A 03/23/2022   Procedure: COLONOSCOPY WITH PROPOFOL ;  Surgeon: Shaaron Lamar HERO, MD;  [facility];  Service: Endoscopy;  Laterality: N/A;  9:00 am   ESOPHAGOGASTRODUODENOSCOPY  09/16/2007   MFM:Jrrzwuljuzi undulating Z-line, tiny distal esophageal  erosions consistent with mild erosive reflux esophagitis, patulous EG junction, small hiatal hernia, otherwise normal stomach, D1, and D2.   ESOPHAGOGASTRODUODENOSCOPY (EGD) WITH PROPOFOL  N/A 01/08/2022   Procedure: ESOPHAGOGASTRODUODENOSCOPY (EGD) WITH PROPOFOL ;  Surgeon: Shaaron Lamar HERO, MD;  [facility];  Service: Endoscopy;  Laterality: N/A;  2:30 pm   HEMORRHOID SURGERY     X 2    MALONEY DILATION N/A 01/08/2022   Procedure: MALONEY DILATION;  Surgeon: Shaaron Lamar HERO, MD;  [facility]: [facility];  Service: Endoscopy;  Laterality: N/A;   POLYPECTOMY  03/23/2022   Procedure: POLYPECTOMY;  Surgeon: Shaaron Lamar HERO, MD;  [facility];  Service: Endoscopy;;   Patient Active Problem List   Diagnosis Date Noted   Chronic fatigue 10/10/2023   Chronic gouty arthritis 10/10/2023   Skin sensation disturbance 10/10/2023   Fibromyalgia 10/10/2023   Acute recurrent maxillary sinusitis 09/21/2023   Sensorineural hearing loss, bilateral 09/21/2023   Impacted cerumen of both ears 09/21/2023   Pain in right hip 08/22/2023   Candidal intertrigo 06/24/2023  Arthralgia of right ankle 02/21/2023   Tinnitus of both ears 01/30/2023   Chronic rhinitis 01/30/2023   Deviated nasal septum 01/30/2023   Hypertrophy of nasal turbinates 01/30/2023   Morbid obesity (HCC) 08/22/2022   Prediabetes 08/22/2022   Edema 02/21/2022   Hypokalemia 02/21/2022   IDA (iron deficiency anemia) 02/20/2022   Elevated alkaline phosphatase level 02/20/2022   Impaired fasting glucose 02/20/2022   Dysphagia 12/14/2021   Arthralgia of right knee 11/08/2021   Hidradenitis suppurativa of anus  11/08/2021   Right upper quadrant pain 11/08/2021   Stage 3a chronic kidney disease (HCC) 11/08/2021   Nausea 06/09/2021   Polymyalgia rheumatica 12/22/2020   Gout 12/15/2020   Acute pharyngitis 10/20/2020   Dyspnea 10/20/2020   Sneezing 10/20/2020   Cough 10/05/2020   Abnormal white blood cell (WBC) count 09/15/2020   Chronic low back pain 09/15/2020   External hemorrhoids 09/15/2020   Generalized anxiety disorder 09/15/2020   Generalized osteoarthritis 09/15/2020   Mild persistent asthma 09/15/2020   Mixed hyperlipidemia 09/15/2020   Obesity 09/15/2020   Allergic rhinitis 09/15/2020   Hyponatremia 06/06/2020   COVID-19 virus infection 06/06/2020   HTN (hypertension) 06/06/2020   Prolapsed internal hemorrhoids, grade 3 05/12/2020   Constipation 04/15/2013   Rectal bleeding 02/11/2013   Gastroesophageal reflux disease 02/11/2013    PCP: Shona Norleen PEDLAR, MD  REFERRING PROVIDER: Margrette Taft BRAVO, MD  REFERRING DIAG: M54.50,G89.29 (ICD-10-CM) - Chronic low back pain, unspecified back pain laterality, unspecified whether sciatica present  Rationale for Evaluation and Treatment: Rehabilitation  THERAPY DIAG:  Other symptoms and signs involving the musculoskeletal system  Muscle weakness (generalized)  Low back pain, unspecified back pain laterality, unspecified chronicity, unspecified whether sciatica present  ONSET DATE: chronic back pain  SUBJECTIVE:                                                                                                                                                                                           SUBJECTIVE STATEMENT: Pt states she had some ankle discomfort after last session, thinking from the weights.  No pain or issues currently, overall 75% better.   Evaluation: History of back surgery years ago; chronic back pain.  Went to Dr. Margrette about her back; he took x-rays and referred to therapy.  History of right side weakness from  the back and then has had some right ankle issues; fx and multiple sprains; also states she has some leg weakness and perhaps varicose veins; difficulty standing more than 15 min at a time.    PERTINENT HISTORY:  L4-L5 back surgery 1984  Hemorrhoids History of gout  Allergy  shots/asthma  PAIN:  Are you having pain? Yes: NPRS scale: 4/10 sitting; 9-10/10 with prolonged standing Pain location: low back Pain description: sore and radiating  Aggravating factors: prolonged standing, prolonged sitting Relieving factors: muscle rub, sitting down and rest, walking  PRECAUTIONS: None   WEIGHT BEARING RESTRICTIONS: No  FALLS:  Has patient fallen in last 6 months? No  OCCUPATION: retired Careers adviser  PLOF: Independent  PATIENT GOALS: get stronger and be able to stand more than 15 min at at time  NEXT MD VISIT: PRN  OBJECTIVE:  Note: Objective measures were completed at Evaluation unless otherwise noted.  DIAGNOSTIC FINDINGS:  Lower back pain history of L4-5 surgery   5 views of the spine.  Coronal and sagittal plane abnormal alignment.  Facet arthritis grade 2 spondylo at L4-L5 or L5-S1, difficult to tell spot films suggested L5-S1   Impression grade 2 spondylolisthesis chronic degenerative changes in the facet joints spondylosis    PATIENT SURVEYS:  Modified Oswestry:  MODIFIED OSWESTRY DISABILITY SCALE  Date: 12/02/2023 Score  Total 22/50; 44%   Interpretation of scores: Score Category Description  0-20% Minimal Disability The patient can cope with most living activities. Usually no treatment is indicated apart from advice on lifting, sitting and exercise  21-40% Moderate Disability The patient experiences more pain and difficulty with sitting, lifting and standing. Travel and social life are more difficult and they may be disabled from work. Personal care, sexual activity and sleeping are not grossly affected, and the patient can usually be managed by conservative means   41-60% Severe Disability Pain remains the main problem in this group, but activities of daily living are affected. These patients require a detailed investigation  61-80% Crippled Back pain impinges on all aspects of the patient's life. Positive intervention is required  81-100% Bed-bound  These patients are either bed-bound or exaggerating their symptoms  Bluford FORBES Zoe DELENA Karon DELENA, et al. Surgery versus conservative management of stable thoracolumbar fracture: the PRESTO feasibility RCT. [place] [employer]; 2021 Nov. [facility] Technology Assessment, No. 25.62.) Appendix 3, Oswestry Disability Index category descriptors. Available from: FindJewelers.cz  Minimally Clinically Important Difference (MCID) = 12.8%  COGNITION: Overall cognitive status: Within functional limits for tasks assessed     SENSATION: Pain down right leg; numbness down right leg    POSTURE: rounded shoulders and forward head  PALPATION: General soreness lumbar paraspinals right side glutes and piriformis  LUMBAR ROM: *pain  AROM eval 12/27/23  Flexion Fingertips to 1 above ankle* low back Fingertips to ankle  Extension 20% available* more painful than forwards 30% available  Right lateral flexion Fingertips to knee joint line   Left lateral flexion Fingertips to knee joint line   Right rotation    Left rotation     (Blank rows = not tested)  LOWER EXTREMITY MMT:    MMT Right eval Left eval Right 12/27/23 Left 12/27/23  Hip flexion 4- 4+ 4+ 4+  Hip extension 4- 4- 4+ 4  Hip abduction      Hip adduction      Hip internal rotation      Hip external rotation      Knee flexion 4 4 4+ 4+  Knee extension 4 4+ 5 5  Ankle dorsiflexion 4 4+ 5 5  Ankle plantarflexion      Ankle inversion      Ankle eversion       (Blank rows = not tested)  FUNCTIONAL TESTS:  5 times sit to stand: 31.38  sec no UE assist  GAIT: Distance walked: 60 ft in  clinic Assistive device utilized: None Level of assistance: Complete Independence Comments: decreased gait speed  TREATMENT DATE: 01/14/24 Nustep, level 4 resistance, seat 7 UE/LE, 6 minutes Standing:  GTB rows 2X10  GTB shoulder extensions 2X10  Pallof GTB 2X10 Lateral step up and overs 6 inch step, 2lb ankle weights 3 way hip in parallel bars, 1 set of 5 reps bilateral, 3 holds 2lb weights Walking marches/walking bwds, 20 foot line, 2lb ankle weights, 2 laps, pt cued for max LE ROM  01/08/2024  Therapeutic Exercise: -Nustep, level 4 resistance, pt cued for 70 SPM -Lateral step up and overs 6 inch step, 3lb ankle weights -Standing 3 way hip in parallel bars, 1 set of 5 reps bilateral, 3lb weights -Lat pull downs, 1 set of 8 reps, plate 3, pt cued for eccentric control Neuromuscular Re-education: -Walking marches/walking bwds, 20 foot line, 3lb ankle weights, 2 laps, pt cued for max LE ROM -Pallof press with GTB at chest level, 1 set of 10 reps bilaterally, pt cued for sequencing  -Bird dogs, 1 set of 4 reps, bilaterally pt cued for sequencing and core activation  01/01/2014 Supine with legs on wedge in decompressed position x 5' to decrease pain with instruction on relaxation breathing LTR x 10 each Abdominal bracing 5 hold x 10 Nustep seat 7 x 5' level 2 Standing  scapular retractions Shoulder extensions Heel raises    12/31/2023  Treadmill, 5 minutes, grade 2 incline, 0.8 mph; cues for increased stride Standing:  RTB scapular retraction 10X  RTB Rows 10X  RTB shoulder extension 10X  Lat stretch bilaterally Supine:   bridges unable today due to cramping LTR 2 set of 10 reps bilaterally, pt cued to remain in pain free ROM Abdominal isometrics 20 reps, 3 second holds, pt cued for breathing pattern Quadruped: Birddog, 1 set of 5 reps, pt cued for neutral spine and increased core control Sit to stands with 5# KB pushout, 2 sets of 5 reps, pt cued for core  activation    PATIENT EDUCATION:  Education details: Patient educated on exam findings, POC, scope of PT, HEP, and what to expect next visit. Person educated: Patient Education method: Explanation, Demonstration, and Handouts Education comprehension: verbalized understanding, returned demonstration, verbal cues required, and tactile cues required  HOME EXERCISE PROGRAM:  Access Code: [email or web address] URL: [email or web address] Date: 12/02/2023 Prepared by: [facility] - Rehab Exercises - Supine Posterior Pelvic Tilt  - 2 x daily - 7 x weekly - 1 sets - 10 reps - 3 sec hold   -Decompression exercises 1-5   Access Code: [email or web address] URL: [email or web address] Date: 12/05/2023 Prepared by: Greig Fuse Exercises - Seated Hamstring Stretch with Chair  - 2 x daily - 7 x weekly - 3 reps - 30 sec hold -hip excursions 3 motions 10X each (no bouncing in stretch)  ASSESSMENT:  CLINICAL IMPRESSION: Began session on nustep for warm up/activity tolerance.  Continued with LE strengthening/stabilization activities.  Pt reported increased pain in her ankles with added weight last session, agreed to continue with lesser weight.  Pt was able to complete tasks without pain using the 2lb weights.  Difficulty keeping upright posturing with activity and reports of some lumbar discomfort with step overs.  Noted shortness of breath throughout session requiring intermittent rest breaks, some standing some seated. . Patient would continue to benefit from skilled physical therapy for decreased low back/LLE pain, increased endurance with ambulation, increased LE/core strength, and improved  balance for improved quality of life, improved independence with functional mobility and continued progress towards therapy goals.     OBJECTIVE IMPAIRMENTS: Abnormal gait, decreased mobility, decreased ROM, decreased strength, impaired perceived functional ability, postural dysfunction, obesity, and pain.    ACTIVITY LIMITATIONS: carrying, lifting, bending, sitting, standing, squatting, sleeping, bathing, and locomotion level  PARTICIPATION LIMITATIONS: meal prep, cleaning, shopping, and community activity  REHAB POTENTIAL: Good  CLINICAL DECISION MAKING: Evolving/moderate complexity  EVALUATION COMPLEXITY: Moderate   GOALS: Goals reviewed with patient? No  SHORT TERM GOALS: Target date: 12/16/2023  patient will be independent with initial HEP  Baseline: Goal status: met  2.  Patient will report 30% improvement overall  Baseline:  Goal status: met   LONG TERM GOALS: Target date: 12/30/2023  Patient will be independent in self management strategies to improve quality of life and functional outcomes.  Baseline:  Goal status: in progress  2.  Patient will report 50% improvement overall  Baseline:  Goal status: in progress  3.  Patient will improve 5 times sit to stand score to 15 sec or less to demonstrate improved functional mobility and increased leg strength.     Baseline: 31.38 sec; 12/27/23 16.31 sec Goal status: in progress  4.   Patient will increase bilateral leg MMT's to 5/5 to allow navigation of steps without gait deviation or loss of balance Baseline:  Goal status: in progress   5.  Patient will be able to stand x 30 min to cook a meal without back pain >3/10 Baseline: 12/27/23 can stand 15 to 20 min Goal status: in progress   PLAN:  PT FREQUENCY: 2x/week  PT DURATION: 4 weeks  PLANNED INTERVENTIONS: 97164- PT Re-evaluation, 97110-Therapeutic exercises, 97530- Therapeutic activity, 97112- Neuromuscular re-education, 97535- Self Care, [id number]- Manual therapy, [id number]- Gait training, [phone]- Orthotic Fit/training, [phone]- Canalith repositioning, [id number]- Aquatic Therapy, 97760- Splinting, [phone]- Wound care (first 20 sq cm), 97598- Wound care (each additional 20 sq cm)Patient/Family education, Balance training, Stair training, Taping, Dry Needling, Joint  mobilization, Joint manipulation, Spinal manipulation, Spinal mobilization, Scar mobilization, and DME instructions. SABRA  PLAN FOR NEXT SESSION:  continue to progress core stabilization, postural strengthening and reduce pain and dysfunction.  Patient will benefit from continued skilled therapy services to address remaining unmet and partially met goals.    Greig KATHEE Fuse, PTA/CLT [facility] [place] [facility] Ph: [phone]  4:38 PM, 01/14/24

## 2024-01-15 ENCOUNTER — Ambulatory Visit: Payer: Self-pay | Admitting: Allergy & Immunology

## 2024-01-15 MED ORDER — ALBUTEROL SULFATE (2.5 MG/3ML) 0.083% IN NEBU: 2.5000 mg | INHALATION_SOLUTION | RESPIRATORY_TRACT | 1 refills | Status: AC | PRN

## 2024-01-15 MED ORDER — BUDESONIDE 0.25 MG/2ML IN SUSP: 0.2500 mg | Freq: Three times a day (TID) | RESPIRATORY_TRACT | 1 refills | Status: AC | PRN

## 2024-01-15 NOTE — Telephone Encounter (Signed)
 Patient came in and expressed that she was having some chest congestion and that she needed budesonide  and albuterol  solution sent in to her pharmacy. Medications were went in. We also reviewed her lab results in office. Patient verbalized understanding. Patient stated that she gets her pneumovax from her PCP. Patient stated that she got hers two years ago. I gave her a print out of the labs along with the dictation of her lab results. Patient expressed that she was going to discuss them with her PCP to ensure that's the vaccine she got. Patient expressed that she would get the vaccine from her PCP if she was not up to date on it. I also faxed labs to Dr. Shona her PCP to ensure that she was given the correct vaccine if needed.

## 2024-01-16 ENCOUNTER — Telehealth: Payer: Self-pay

## 2024-01-16 ENCOUNTER — Other Ambulatory Visit (HOSPITAL_COMMUNITY): Payer: Self-pay

## 2024-01-16 ENCOUNTER — Encounter (HOSPITAL_COMMUNITY): Admitting: Physical Therapy

## 2024-01-16 NOTE — Telephone Encounter (Signed)
*  AA  Pharmacy Patient Advocate Encounter   Received notification from CoverMyMeds that prior authorization for Budesonide  0.25MG /2ML suspension  is required/requested.   Insurance verification completed.   The patient is insured through [facility] ADVANTAGE/RX ADVANCE.   Per test claim: Medication is not eligible for pharmacy benefits and must be billed through medical insurance. As our team only handles pharmacy related prior auths, medical PA's must be submitted by the clinic. Thank you

## 2024-01-17 ENCOUNTER — Telehealth: Payer: Self-pay

## 2024-01-17 DIAGNOSIS — Z23 Encounter for immunization: Secondary | ICD-10-CM | POA: Diagnosis not present

## 2024-01-17 DIAGNOSIS — F411 Generalized anxiety disorder: Secondary | ICD-10-CM | POA: Diagnosis not present

## 2024-01-17 DIAGNOSIS — I10 Essential (primary) hypertension: Secondary | ICD-10-CM | POA: Diagnosis not present

## 2024-01-17 DIAGNOSIS — J310 Chronic rhinitis: Secondary | ICD-10-CM | POA: Diagnosis not present

## 2024-01-17 DIAGNOSIS — M545 Low back pain, unspecified: Secondary | ICD-10-CM | POA: Diagnosis not present

## 2024-01-17 DIAGNOSIS — Z6837 Body mass index (BMI) 37.0-37.9, adult: Secondary | ICD-10-CM | POA: Diagnosis not present

## 2024-01-17 DIAGNOSIS — J342 Deviated nasal septum: Secondary | ICD-10-CM | POA: Diagnosis not present

## 2024-01-17 DIAGNOSIS — D509 Iron deficiency anemia, unspecified: Secondary | ICD-10-CM | POA: Diagnosis not present

## 2024-01-17 DIAGNOSIS — H903 Sensorineural hearing loss, bilateral: Secondary | ICD-10-CM | POA: Diagnosis not present

## 2024-01-17 DIAGNOSIS — K219 Gastro-esophageal reflux disease without esophagitis: Secondary | ICD-10-CM | POA: Diagnosis not present

## 2024-01-17 DIAGNOSIS — J4530 Mild persistent asthma, uncomplicated: Secondary | ICD-10-CM | POA: Diagnosis not present

## 2024-01-17 NOTE — Telephone Encounter (Signed)
*  AA  Pharmacy Patient Advocate Encounter   Received notification from CoverMyMeds that prior authorization for Albuterol  Sulfate (2.5 MG/3ML)0.083% nebulizer solution  is required/requested.   Insurance verification completed.   The patient is insured through [facility] ADVANTAGE/RX ADVANCE .   Per test claim: Medication is not eligible for pharmacy benefits and must be billed through medical insurance. As our team only handles pharmacy related prior auths, medical PA's must be submitted by the clinic. Thank you

## 2024-01-21 ENCOUNTER — Ambulatory Visit (HOSPITAL_COMMUNITY): Attending: Orthopedic Surgery | Admitting: Physical Therapy

## 2024-01-21 DIAGNOSIS — M6281 Muscle weakness (generalized): Secondary | ICD-10-CM | POA: Diagnosis not present

## 2024-01-21 DIAGNOSIS — M545 Low back pain, unspecified: Secondary | ICD-10-CM | POA: Diagnosis not present

## 2024-01-21 DIAGNOSIS — R29898 Other symptoms and signs involving the musculoskeletal system: Secondary | ICD-10-CM | POA: Insufficient documentation

## 2024-01-21 NOTE — Therapy (Signed)
 OUTPATIENT PHYSICAL THERAPY THORACOLUMBAR TREATMENT     Patient Name: Kendra Orozco MRN: [id number] DOB:[birth year], 78 y.o., female Today's Date: 01/21/2024  END OF SESSION:  PT End of Session - 01/21/24 1100     Visit Number 12    Number of Visits 16    Date for Recertification  01/24/24    Authorization Type HTA    Authorization Time Period no auth needed    PT Start Time 1032    PT Stop Time 1115    PT Time Calculation (min) 43 min    Activity Tolerance Patient tolerated treatment well    Behavior During Therapy WFL for tasks assessed/performed                Past Medical History:  Diagnosis Date   Angio-edema    Anxiety    Arthritis    Asthma    Cancer (HCC)    skin and colon   GERD (gastroesophageal reflux disease)    Gout    Hypertension    Urticaria    Past Surgical History:  Procedure Laterality Date   ABDOMINAL HYSTERECTOMY     fibroid tumors   APPENDECTOMY     BACK SURGERY     BIOPSY  01/08/2022   Procedure: BIOPSY;  Surgeon: Shaaron Lamar HERO, MD;  [facility]: [facility];  Service: Endoscopy;;   BREAST CYST EXCISION Right    CATARACT EXTRACTION W/PHACO Right 11/20/2019   Procedure: CATARACT EXTRACTION PHACO AND INTRAOCULAR LENS PLACEMENT (IOC) RIGHT EYE;  Surgeon: Harrie Agent, MD;  Location: [facility];  Service: Ophthalmology;  Laterality: Right;  CDE: 17.30   CATARACT EXTRACTION W/PHACO Left 12/04/2019   Procedure: CATARACT EXTRACTION PHACO AND INTRAOCULAR LENS PLACEMENT LEFT EYE;  Surgeon: Harrie Agent, MD;  Location: [facility];  Service: Ophthalmology;  Laterality: Left;  CDE: 14.59   COLONOSCOPY  09/16/2007   MFM:Jwjo canal hemorrhoids, otherwise normal rectum left-sided diverticula and colonic mucosa appeared normal.   COLONOSCOPY N/A 03/02/2013   Dr. Rolfe adenoma. Colonic diverticulosis. Anal canal hemorrhoids-likely source of hematochezia. Surveillance 2019   COLONOSCOPY WITH PROPOFOL  N/A 07/29/2017   Non-bleeding internal  hemorrhoids, cecal AVMs.    COLONOSCOPY WITH PROPOFOL  N/A 03/23/2022   Procedure: COLONOSCOPY WITH PROPOFOL ;  Surgeon: Shaaron Lamar HERO, MD;  [facility]: [facility];  Service: Endoscopy;  Laterality: N/A;  9:00 am   ESOPHAGOGASTRODUODENOSCOPY  09/16/2007   MFM:Jrrzwuljuzi undulating Z-line, tiny distal esophageal  erosions consistent with mild erosive reflux esophagitis, patulous EG junction, small hiatal hernia, otherwise normal stomach, D1, and D2.   ESOPHAGOGASTRODUODENOSCOPY (EGD) WITH PROPOFOL  N/A 01/08/2022   Procedure: ESOPHAGOGASTRODUODENOSCOPY (EGD) WITH PROPOFOL ;  Surgeon: Shaaron Lamar HERO, MD;  [facility];  Service: Endoscopy;  Laterality: N/A;  2:30 pm   HEMORRHOID SURGERY     X 2    MALONEY DILATION N/A 01/08/2022   Procedure: MALONEY DILATION;  Surgeon: Shaaron Lamar HERO, MD;  [facility]: [facility];  Service: Endoscopy;  Laterality: N/A;   POLYPECTOMY  03/23/2022   Procedure: POLYPECTOMY;  Surgeon: Shaaron Lamar HERO, MD;  [facility]: [facility];  Service: Endoscopy;;   Patient Active Problem List   Diagnosis Date Noted   Chronic fatigue 10/10/2023   Chronic gouty arthritis 10/10/2023   Skin sensation disturbance 10/10/2023   Fibromyalgia 10/10/2023   Acute recurrent maxillary sinusitis 09/21/2023   Sensorineural hearing loss, bilateral 09/21/2023   Impacted cerumen of both ears 09/21/2023   Pain in right hip 08/22/2023   Candidal intertrigo 06/24/2023  Arthralgia of right ankle 02/21/2023   Tinnitus of both ears 01/30/2023   Chronic rhinitis 01/30/2023   Deviated nasal septum 01/30/2023   Hypertrophy of nasal turbinates 01/30/2023   Morbid obesity (HCC) 08/22/2022   Prediabetes 08/22/2022   Edema 02/21/2022   Hypokalemia 02/21/2022   IDA (iron deficiency anemia) 02/20/2022   Elevated alkaline phosphatase level 02/20/2022   Impaired fasting glucose 02/20/2022   Dysphagia 12/14/2021   Arthralgia of right knee 11/08/2021   Hidradenitis suppurativa of anus  11/08/2021   Right upper quadrant pain 11/08/2021   Stage 3a chronic kidney disease (HCC) 11/08/2021   Nausea 06/09/2021   Polymyalgia rheumatica 12/22/2020   Gout 12/15/2020   Acute pharyngitis 10/20/2020   Dyspnea 10/20/2020   Sneezing 10/20/2020   Cough 10/05/2020   Abnormal white blood cell (WBC) count 09/15/2020   Chronic low back pain 09/15/2020   External hemorrhoids 09/15/2020   Generalized anxiety disorder 09/15/2020   Generalized osteoarthritis 09/15/2020   Mild persistent asthma 09/15/2020   Mixed hyperlipidemia 09/15/2020   Obesity 09/15/2020   Allergic rhinitis 09/15/2020   Hyponatremia 06/06/2020   COVID-19 virus infection 06/06/2020   HTN (hypertension) 06/06/2020   Prolapsed internal hemorrhoids, grade 3 05/12/2020   Constipation 04/15/2013   Rectal bleeding 02/11/2013   Gastroesophageal reflux disease 02/11/2013    PCP: Shona Norleen PEDLAR, MD  REFERRING PROVIDER: Margrette Taft BRAVO, MD  REFERRING DIAG: M54.50,G89.29 (ICD-10-CM) - Chronic low back pain, unspecified back pain laterality, unspecified whether sciatica present  Rationale for Evaluation and Treatment: Rehabilitation  THERAPY DIAG:  Other symptoms and signs involving the musculoskeletal system  Muscle weakness (generalized)  Low back pain, unspecified back pain laterality, unspecified chronicity, unspecified whether sciatica present  ONSET DATE: chronic back pain  SUBJECTIVE:                                                                                                                                                                                           SUBJECTIVE STATEMENT: Pt comes today with bandage on Lt thenar area from getting hand caught in car hatch and states she also fell up her stair Friday when toting water  in from grocery store. States she did not clear her foot and got caught. Reports also some Rt shoulder/upper back discomfort from catching her fall.   No other pain or issues  currently.   Evaluation: History of back surgery years ago; chronic back pain.  Went to Dr. Margrette about her back; he took x-rays and referred to therapy.  History of right side weakness from the back and then has had some right ankle issues; fx and multiple sprains; also  states she has some leg weakness and perhaps varicose veins; difficulty standing more than 15 min at a time.    PERTINENT HISTORY:  L4-L5 back surgery 1984  Hemorrhoids History of gout  Allergy  shots/asthma   PAIN:  Are you having pain? Yes: NPRS scale: 4/10 sitting; 9-10/10 with prolonged standing Pain location: low back Pain description: sore and radiating  Aggravating factors: prolonged standing, prolonged sitting Relieving factors: muscle rub, sitting down and rest, walking  PRECAUTIONS: None   WEIGHT BEARING RESTRICTIONS: No  FALLS:  Has patient fallen in last 6 months? No  OCCUPATION: retired Careers adviser  PLOF: Independent  PATIENT GOALS: get stronger and be able to stand more than 15 min at at time  NEXT MD VISIT: PRN  OBJECTIVE:  Note: Objective measures were completed at Evaluation unless otherwise noted.  DIAGNOSTIC FINDINGS:  Lower back pain history of L4-5 surgery   5 views of the spine.  Coronal and sagittal plane abnormal alignment.  Facet arthritis grade 2 spondylo at L4-L5 or L5-S1, difficult to tell spot films suggested L5-S1   Impression grade 2 spondylolisthesis chronic degenerative changes in the facet joints spondylosis    PATIENT SURVEYS:  Modified Oswestry:  MODIFIED OSWESTRY DISABILITY SCALE  Date: 12/02/2023 Score  Total 22/50; 44%   Interpretation of scores: Score Category Description  0-20% Minimal Disability The patient can cope with most living activities. Usually no treatment is indicated apart from advice on lifting, sitting and exercise  21-40% Moderate Disability The patient experiences more pain and difficulty with sitting, lifting and standing. Travel and  social life are more difficult and they may be disabled from work. Personal care, sexual activity and sleeping are not grossly affected, and the patient can usually be managed by conservative means  41-60% Severe Disability Pain remains the main problem in this group, but activities of daily living are affected. These patients require a detailed investigation  61-80% Crippled Back pain impinges on all aspects of the patient's life. Positive intervention is required  81-100% Bed-bound  These patients are either bed-bound or exaggerating their symptoms  Bluford FORBES Zoe DELENA Karon DELENA, et al. Surgery versus conservative management of stable thoracolumbar fracture: the PRESTO feasibility RCT. [place] ([place]): [employer]; 2021 Nov. [facility] Technology Assessment, No. 25.62.) Appendix 3, Oswestry Disability Index category descriptors. Available from: Find[employer]el[employer]cz  Minimally Clinically Important Difference (MCID) = 12.8%  COGNITION: Overall cognitive status: Within functional limits for tasks assessed     SENSATION: Pain down right leg; numbness down right leg    POSTURE: rounded shoulders and forward head  PALPATION: General soreness lumbar paraspinals right side glutes and piriformis  LUMBAR ROM: *pain  AROM eval 12/27/23  Flexion Fingertips to 1 above ankle* low back Fingertips to ankle  Extension 20% available* more painful than forwards 30% available  Right lateral flexion Fingertips to knee joint line   Left lateral flexion Fingertips to knee joint line   Right rotation    Left rotation     (Blank rows = not tested)  LOWER EXTREMITY MMT:    MMT Right eval Left eval Right 12/27/23 Left 12/27/23  Hip flexion 4- 4+ 4+ 4+  Hip extension 4- 4- 4+ 4  Hip abduction      Hip adduction      Hip internal rotation      Hip external rotation      Knee flexion 4 4 4+ 4+  Knee extension 4 4+ 5 5  Ankle dorsiflexion 4 4+ 5  5  Ankle  plantarflexion      Ankle inversion      Ankle eversion       (Blank rows = not tested)  FUNCTIONAL TESTS:  5 times sit to stand: 31.38 sec no UE assist  GAIT: Distance walked: 60 ft in clinic Assistive device utilized: None Level of assistance: Complete Independence Comments: decreased gait speed  TREATMENT DATE: 01/21/24 Nustep, level 4 resistance, seat 7 UE/LE, 6 minutes Standing:  GTB rows 2X10  GTB shoulder extensions 2X10  Pallof GTB 2X10 facing Rt only (unable to do Lt) Lateral step up and overs 6 inch step, no ankle weights 10X each 1 UE assist 3 way hip in parallel bars, 1 set of 5 reps bilateral, 3 holds no weights Walking marches high lift/walking bwds, 20 foot line, no ankle weights, 2 laps, pt cued for max LE ROM   01/14/24 Nustep, level 4 resistance, seat 7 UE/LE, 6 minutes Standing:  GTB rows 2X10  GTB shoulder extensions 2X10  Pallof GTB 2X10  Lateral step up and overs 6 inch step, 2# ankle weights 3 way hip in parallel bars, 2 set of 5 reps bilateral, 3 holds 2# weights Walking marches/walking bwds, 20 foot line, 2# ankle weights, 2 laps, pt cued for max LE ROM  01/08/2024  Therapeutic Exercise: -Nustep, level 4 resistance, pt cued for 70 SPM -Lateral step up and overs 6 inch step, 3lb ankle weights -Standing 3 way hip in parallel bars, 1 set of 5 reps bilateral, 3lb weights -Lat pull downs, 1 set of 8 reps, plate 3, pt cued for eccentric control Neuromuscular Re-education: -Walking marches/walking bwds, 20 foot line, 3lb ankle weights, 2 laps, pt cued for max LE ROM -Pallof press with GTB at chest level, 1 set of 10 reps bilaterally, pt cued for sequencing  -Bird dogs, 1 set of 4 reps, bilaterally pt cued for sequencing and core activation  01/01/2014 Supine with legs on wedge in decompressed position x 5' to decrease pain with instruction on relaxation breathing LTR x 10 each Abdominal bracing 5 hold x 10 Nustep seat 7 x 5' level 2 Standing   scapular retractions Shoulder extensions Heel raises    12/31/2023  Treadmill, 5 minutes, grade 2 incline, 0.8 mph; cues for increased stride Standing:  RTB scapular retraction 10X  RTB Rows 10X  RTB shoulder extension 10X  Lat stretch bilaterally Supine:   bridges unable today due to cramping LTR 2 set of 10 reps bilaterally, pt cued to remain in pain free ROM Abdominal isometrics 20 reps, 3 second holds, pt cued for breathing pattern Quadruped: Birddog, 1 set of 5 reps, pt cued for neutral spine and increased core control Sit to stands with 5# KB pushout, 2 sets of 5 reps, pt cued for core activation    PATIENT EDUCATION:  Education details: Patient educated on exam findings, POC, scope of PT, HEP, and what to expect next visit. Person educated: Patient Education method: Explanation, Demonstration, and Handouts Education comprehension: verbalized understanding, returned demonstration, verbal cues required, and tactile cues required  HOME EXERCISE PROGRAM:  Access Code: [email or web address] URL: [email or web address] Date: 12/02/2023 Prepared by: [facility] - Rehab Exercises - Supine Posterior Pelvic Tilt  - 2 x daily - 7 x weekly - 1 sets - 10 reps - 3 sec hold   -Decompression exercises 1-5   Access Code: [email or web address] URL: [email or web address] Date: 12/05/2023 Prepared by: Greig Fuse Exercises - Seated Hamstring Stretch with Chair  - 2 x daily - 7 x  weekly - 3 reps - 30 sec hold -hip excursions 3 motions 10X each (no bouncing in stretch)  ASSESSMENT:  CLINICAL IMPRESSION: Focus remained on LE strengthening/stabilization activities.  Able to modify activities requiring gripping with Lt using fingers only.  Pt reported no increased pain in Rt knee with completion of activities today.  Noted SOB, reduced activity tolerance requiring breaks intermittently.   Pt reported even the lesser weight last visit aggravated her Rt ankle so completed activities without  weight today. Pt unable to face left and complete pallof today due to Lt shoulder/upper back discomfort from bracing self from fall on Friday.  Continued difficulty keeping upright posturing with activity requiring cues.   Patient would continue to benefit from skilled physical therapy for decreased low back/LLE pain, increased endurance with ambulation, increased LE/core strength, and improved balance for improved quality of life, improved independence with functional mobility and continued progress towards therapy goals.     OBJECTIVE IMPAIRMENTS: Abnormal gait, decreased mobility, decreased ROM, decreased strength, impaired perceived functional ability, postural dysfunction, obesity, and pain.   ACTIVITY LIMITATIONS: carrying, lifting, bending, sitting, standing, squatting, sleeping, bathing, and locomotion level  PARTICIPATION LIMITATIONS: meal prep, cleaning, shopping, and community activity  REHAB POTENTIAL: Good  CLINICAL DECISION MAKING: Evolving/moderate complexity  EVALUATION COMPLEXITY: Moderate   GOALS: Goals reviewed with patient? No  SHORT TERM GOALS: Target date: 12/16/2023  patient will be independent with initial HEP  Baseline: Goal status: met  2.  Patient will report 30% improvement overall  Baseline:  Goal status: met   LONG TERM GOALS: Target date: 12/30/2023  Patient will be independent in self management strategies to improve quality of life and functional outcomes.  Baseline:  Goal status: in progress  2.  Patient will report 50% improvement overall  Baseline:  Goal status: in progress  3.  Patient will improve 5 times sit to stand score to 15 sec or less to demonstrate improved functional mobility and increased leg strength.     Baseline: 31.38 sec; 12/27/23 16.31 sec Goal status: in progress  4.   Patient will increase bilateral leg MMT's to 5/5 to allow navigation of steps without gait deviation or loss of balance Baseline:  Goal status:  in progress   5.  Patient will be able to stand x 30 min to cook a meal without back pain >3/10 Baseline: 12/27/23 can stand 15 to 20 min Goal status: in progress   PLAN:  PT FREQUENCY: 2x/week  PT DURATION: 4 weeks  PLANNED INTERVENTIONS: [id number]- PT Re-evaluation, 97110-Therapeutic exercises, 97530- Therapeutic activity, 97112- Neuromuscular re-education, 97535- Self Care, [id number]- Manual therapy, [id number]- Gait training, [phone]- Orthotic Fit/training, [phone]- Canalith repositioning, [id number]- Aquatic Therapy, 97760- Splinting, [phone]- Wound care (first 20 sq cm), 97598- Wound care (each additional 20 sq cm)Patient/Family education, Balance training, Stair training, Taping, Dry Needling, Joint mobilization, Joint manipulation, Spinal manipulation, Spinal mobilization, Scar mobilization, and DME instructions. SABRA  PLAN FOR NEXT SESSION:  continue to progress core stabilization, postural strengthening and reduce pain and dysfunction.  Patient will benefit from continued skilled therapy services to address remaining unmet and partially met goals.    Greig KATHEE Fuse, PTA/CLT [facility] [place] Ph: [phone]  11:02 AM, 01/21/24

## 2024-01-24 ENCOUNTER — Ambulatory Visit (INDEPENDENT_AMBULATORY_CARE_PROVIDER_SITE_OTHER): Payer: Self-pay

## 2024-01-24 ENCOUNTER — Ambulatory Visit (HOSPITAL_COMMUNITY)

## 2024-01-24 DIAGNOSIS — R29898 Other symptoms and signs involving the musculoskeletal system: Secondary | ICD-10-CM

## 2024-01-24 DIAGNOSIS — M6281 Muscle weakness (generalized): Secondary | ICD-10-CM

## 2024-01-24 DIAGNOSIS — J309 Allergic rhinitis, unspecified: Secondary | ICD-10-CM | POA: Diagnosis not present

## 2024-01-24 DIAGNOSIS — M545 Low back pain, unspecified: Secondary | ICD-10-CM

## 2024-01-24 NOTE — Therapy (Signed)
 OUTPATIENT PHYSICAL THERAPY THORACOLUMBAR TREATMENT     Patient Name: Kendra Orozco MRN: [id number] DOB:02/23/1946, 78 y.o., female Today's Date: 01/24/2024  END OF SESSION:  PT End of Session - 01/24/24 0812     Visit Number 13    Number of Visits 17    Date for Recertification  02/21/24    Authorization Type HTA    Authorization Time Period no auth needed    PT Start Time 0814    PT Stop Time 0855    PT Time Calculation (min) 41 min    Activity Tolerance Patient tolerated treatment well    Behavior During Therapy [facility] for tasks assessed/performed                Past Medical History:  Diagnosis Date   Angio-edema    Anxiety    Arthritis    Asthma    Cancer (HCC)    skin and colon   GERD (gastroesophageal reflux disease)    Gout    Hypertension    Urticaria    Past Surgical History:  Procedure Laterality Date   ABDOMINAL HYSTERECTOMY     fibroid tumors   APPENDECTOMY     BACK SURGERY     BIOPSY  01/08/2022   Procedure: BIOPSY;  Surgeon: Shaaron Lamar HERO, MD;  Location: [facility];  Service: Endoscopy;;   BREAST CYST EXCISION Right    CATARACT EXTRACTION W/PHACO Right 11/20/2019   Procedure: CATARACT EXTRACTION PHACO AND INTRAOCULAR LENS PLACEMENT (IOC) RIGHT EYE;  Surgeon: Harrie Agent, MD;  Location: [facility];  Service: Ophthalmology;  Laterality: Right;  CDE: 17.30   CATARACT EXTRACTION W/PHACO Left 12/04/2019   Procedure: CATARACT EXTRACTION PHACO AND INTRAOCULAR LENS PLACEMENT LEFT EYE;  Surgeon: Harrie Agent, MD;  Location: [facility];  Service: Ophthalmology;  Laterality: Left;  CDE: 14.59   COLONOSCOPY  09/16/2007   MFM:Jwjo canal hemorrhoids, otherwise normal rectum left-sided diverticula and colonic mucosa appeared normal.   COLONOSCOPY N/A 03/02/2013   Dr. Rolfe adenoma. Colonic diverticulosis. Anal canal hemorrhoids-likely source of hematochezia. Surveillance 2019   COLONOSCOPY WITH PROPOFOL  N/A 07/29/2017   Non-bleeding internal  hemorrhoids, cecal AVMs.    COLONOSCOPY WITH PROPOFOL  N/A 03/23/2022   Procedure: COLONOSCOPY WITH PROPOFOL ;  Surgeon: Shaaron Lamar HERO, MD;  [facility];  Service: Endoscopy;  Laterality: N/A;  9:00 am   ESOPHAGOGASTRODUODENOSCOPY  09/16/2007   MFM:Jrrzwuljuzi undulating Z-line, tiny distal esophageal  erosions consistent with mild erosive reflux esophagitis, patulous EG junction, small hiatal hernia, otherwise normal stomach, D1, and D2.   ESOPHAGOGASTRODUODENOSCOPY (EGD) WITH PROPOFOL  N/A 01/08/2022   Procedure: ESOPHAGOGASTRODUODENOSCOPY (EGD) WITH PROPOFOL ;  Surgeon: Shaaron Lamar HERO, MD;  [facility];  Service: Endoscopy;  Laterality: N/A;  2:30 pm   HEMORRHOID SURGERY     X 2    MALONEY DILATION N/A 01/08/2022   Procedure: MALONEY DILATION;  Surgeon: Shaaron Lamar HERO, MD;  [facility];  Service: Endoscopy;  Laterality: N/A;   POLYPECTOMY  03/23/2022   Procedure: POLYPECTOMY;  Surgeon: Shaaron Lamar HERO, MD;  [facility]: [facility];  Service: Endoscopy;;   Patient Active Problem List   Diagnosis Date Noted   Chronic fatigue 10/10/2023   Chronic gouty arthritis 10/10/2023   Skin sensation disturbance 10/10/2023   Fibromyalgia 10/10/2023   Acute recurrent maxillary sinusitis 09/21/2023   Sensorineural hearing loss, bilateral 09/21/2023   Impacted cerumen of both ears 09/21/2023   Pain in right hip 08/22/2023   Candidal intertrigo 06/24/2023  Arthralgia of right ankle 02/21/2023   Tinnitus of both ears 01/30/2023   Chronic rhinitis 01/30/2023   Deviated nasal septum 01/30/2023   Hypertrophy of nasal turbinates 01/30/2023   Morbid obesity (HCC) 08/22/2022   Prediabetes 08/22/2022   Edema 02/21/2022   Hypokalemia 02/21/2022   IDA (iron deficiency anemia) 02/20/2022   Elevated alkaline phosphatase level 02/20/2022   Impaired fasting glucose 02/20/2022   Dysphagia 12/14/2021   Arthralgia of right knee 11/08/2021   Hidradenitis suppurativa of anus  11/08/2021   Right upper quadrant pain 11/08/2021   Stage 3a chronic kidney disease (HCC) 11/08/2021   Nausea 06/09/2021   Polymyalgia rheumatica 12/22/2020   Gout 12/15/2020   Acute pharyngitis 10/20/2020   Dyspnea 10/20/2020   Sneezing 10/20/2020   Cough 10/05/2020   Abnormal white blood cell (WBC) count 09/15/2020   Chronic low back pain 09/15/2020   External hemorrhoids 09/15/2020   Generalized anxiety disorder 09/15/2020   Generalized osteoarthritis 09/15/2020   Mild persistent asthma 09/15/2020   Mixed hyperlipidemia 09/15/2020   Obesity 09/15/2020   Allergic rhinitis 09/15/2020   Hyponatremia 06/06/2020   COVID-19 virus infection 06/06/2020   HTN (hypertension) 06/06/2020   Prolapsed internal hemorrhoids, grade 3 05/12/2020   Constipation 04/15/2013   Rectal bleeding 02/11/2013   Gastroesophageal reflux disease 02/11/2013    PCP: Shona Norleen PEDLAR, MD  REFERRING PROVIDER: Margrette Taft BRAVO, MD  REFERRING DIAG: M54.50,G89.29 (ICD-10-CM) - Chronic low back pain, unspecified back pain laterality, unspecified whether sciatica present  Rationale for Evaluation and Treatment: Rehabilitation  THERAPY DIAG:  Other symptoms and signs involving the musculoskeletal system  Muscle weakness (generalized)  Low back pain, unspecified back pain laterality, unspecified chronicity, unspecified whether sciatica present  ONSET DATE: chronic back pain  SUBJECTIVE:                                                                                                                                                                                           SUBJECTIVE STATEMENT: Improved movement 50 to 60% overall; still a little sore from her fall recently; recently resting better; feels her core needs continued strengthening.  Evaluation: History of back surgery years ago; chronic back pain.  Went to Dr. Margrette about her back; he took x-rays and referred to therapy.  History of right side  weakness from the back and then has had some right ankle issues; fx and multiple sprains; also states she has some leg weakness and perhaps varicose veins; difficulty standing more than 15 min at a time.    PERTINENT HISTORY:  L4-L5 back surgery 1984  Hemorrhoids History of gout  Allergy  shots/asthma  PAIN:  Are you having pain? Yes: NPRS scale: 4/10 sitting; 9-10/10 with prolonged standing Pain location: low back Pain description: sore and radiating  Aggravating factors: prolonged standing, prolonged sitting Relieving factors: muscle rub, sitting down and rest, walking  PRECAUTIONS: None   WEIGHT BEARING RESTRICTIONS: No  FALLS:  Has patient fallen in last 6 months? No  OCCUPATION: retired Careers adviser  PLOF: Independent  PATIENT GOALS: get stronger and be able to stand more than 15 min at at time  NEXT MD VISIT: PRN  OBJECTIVE:  Note: Objective measures were completed at Evaluation unless otherwise noted.  DIAGNOSTIC FINDINGS:  Lower back pain history of L4-5 surgery   5 views of the spine.  Coronal and sagittal plane abnormal alignment.  Facet arthritis grade 2 spondylo at L4-L5 or L5-S1, difficult to tell spot films suggested L5-S1   Impression grade 2 spondylolisthesis chronic degenerative changes in the facet joints spondylosis    PATIENT SURVEYS:  Modified Oswestry:  MODIFIED OSWESTRY DISABILITY SCALE  Date: 12/02/2023 Score  Total 22/50; 44%   Interpretation of scores: Score Category Description  0-20% Minimal Disability The patient can cope with most living activities. Usually no treatment is indicated apart from advice on lifting, sitting and exercise  21-40% Moderate Disability The patient experiences more pain and difficulty with sitting, lifting and standing. Travel and social life are more difficult and they may be disabled from work. Personal care, sexual activity and sleeping are not grossly affected, and the patient can usually be managed by  conservative means  41-60% Severe Disability Pain remains the main problem in this group, but activities of daily living are affected. These patients require a detailed investigation  61-80% Crippled Back pain impinges on all aspects of the patient's life. Positive intervention is required  81-100% Bed-bound  These patients are either bed-bound or exaggerating their symptoms  Bluford FORBES Zoe DELENA Karon DELENA, et al. Surgery versus conservative management of stable thoracolumbar fracture: the PRESTO feasibility RCT. [place] ([place]): [employer]; 2021 Nov. [facility] Technology Assessment, No. 25.62.) Appendix 3, Oswestry Disability Index category descriptors. Available from: FindJewel[employer].cz  Minimally Clinically Important Difference (MCID) = 12.8%  COGNITION: Overall cognitive status: Within functional limits for tasks assessed     SENSATION: Pain down right leg; numbness down right leg    POSTURE: rounded shoulders and forward head  PALPATION: General soreness lumbar paraspinals right side glutes and piriformis  LUMBAR ROM: *pain  AROM eval 12/27/23 01/24/24  Flexion Fingertips to 1 above ankle* low back Fingertips to ankle Fingertips 1 from toes  Extension 20% available* more painful than forwards 30% available 35% available Pulling  Right lateral flexion Fingertips to knee joint line    Left lateral flexion Fingertips to knee joint line    Right rotation     Left rotation      (Blank rows = not tested)  LOWER EXTREMITY MMT:    MMT Right eval Left eval Right 12/27/23 Left 12/27/23 Right 01/24/24 Left 01/24/24  Hip flexion 4- 4+ 4+ 4+ 4+ 4+  Hip extension 4- 4- 4+ 4 4+ 4  Hip abduction        Hip adduction        Hip internal rotation        Hip external rotation        Knee flexion 4 4 4+ 4+ 4+ 5  Knee extension 4 4+ 5 5 5 5   Ankle dorsiflexion 4 4+ 5 5 5 5   Ankle plantarflexion  Ankle inversion        Ankle eversion          (Blank rows = not tested)  FUNCTIONAL TESTS:  5 times sit to stand: 31.38 sec no UE assist  GAIT: Distance walked: 60 ft in clinic Assistive device utilized: None Level of assistance: Complete Independence Comments: decreased gait speed  TREATMENT DATE: 01/24/24 Nustep, level 4 resistance, seat 7 UE/LE, 5 minutes; dynamic warm up Progress note Modified Oswestry 16/50 32% 5 times sit to stand 8.88 sec no UE assist AROM and MMT's see above Goal review Standing hip extension 2 hold x 10 each   01/21/24 Nustep, level 4 resistance, seat 7 UE/LE, 6 minutes Standing:  GTB rows 2X10  GTB shoulder extensions 2X10  Pallof GTB 2X10 facing Rt only (unable to do Lt) Lateral step up and overs 6 inch step, no ankle weights 10X each 1 UE assist 3 way hip in parallel bars, 1 set of 5 reps bilateral, 3 holds no weights Walking marches high lift/walking bwds, 20 foot line, no ankle weights, 2 laps, pt cued for max LE ROM   01/14/24 Nustep, level 4 resistance, seat 7 UE/LE, 6 minutes Standing:  GTB rows 2X10  GTB shoulder extensions 2X10  Pallof GTB 2X10  Lateral step up and overs 6 inch step, 2# ankle weights 3 way hip in parallel bars, 2 set of 5 reps bilateral, 3 holds 2# weights Walking marches/walking bwds, 20 foot line, 2# ankle weights, 2 laps, pt cued for max LE ROM  01/08/2024  Therapeutic Exercise: -Nustep, level 4 resistance, pt cued for 70 SPM -Lateral step up and overs 6 inch step, 3lb ankle weights -Standing 3 way hip in parallel bars, 1 set of 5 reps bilateral, 3lb weights -Lat pull downs, 1 set of 8 reps, plate 3, pt cued for eccentric control Neuromuscular Re-education: -Walking marches/walking bwds, 20 foot line, 3lb ankle weights, 2 laps, pt cued for max LE ROM -Pallof press with GTB at chest level, 1 set of 10 reps bilaterally, pt cued for sequencing  -Bird dogs, 1 set of 4 reps, bilaterally pt cued for sequencing and core activation  01/01/2014 Supine with  legs on wedge in decompressed position x 5' to decrease pain with instruction on relaxation breathing LTR x 10 each Abdominal bracing 5 hold x 10 Nustep seat 7 x 5' level 2 Standing  scapular retractions Shoulder extensions Heel raises    12/31/2023  Treadmill, 5 minutes, grade 2 incline, 0.8 mph; cues for increased stride Standing:  RTB scapular retraction 10X  RTB Rows 10X  RTB shoulder extension 10X  Lat stretch bilaterally Supine:   bridges unable today due to cramping LTR 2 set of 10 reps bilaterally, pt cued to remain in pain free ROM Abdominal isometrics 20 reps, 3 second holds, pt cued for breathing pattern Quadruped: Birddog, 1 set of 5 reps, pt cued for neutral spine and increased core control Sit to stands with 5# KB pushout, 2 sets of 5 reps, pt cued for core activation    PATIENT EDUCATION:  Education details: Patient educated on exam findings, POC, scope of PT, HEP, and what to expect next visit. Person educated: Patient Education method: Explanation, Demonstration, and Handouts Education comprehension: verbalized understanding, returned demonstration, verbal cues required, and tactile cues required  HOME EXERCISE PROGRAM:  Access Code: [email or web address] URL: [email or web address] Date: 12/02/2023 Prepared by: [facility] - Rehab Exercises - Supine Posterior Pelvic Tilt  - 2 x daily - 7 x weekly - 1  sets - 10 reps - 3 sec hold   -Decompression exercises 1-5   Access Code: [email or web address] URL: [email or web address] Date: 12/05/2023 Prepared by: Greig Fuse Exercises - Seated Hamstring Stretch with Chair  - 2 x daily - 7 x weekly - 3 reps - 30 sec hold -hip excursions 3 motions 10X each (no bouncing in stretch)  ASSESSMENT:  CLINICAL IMPRESSION: Progress note today.good improvement with Modified Oswestry and met goal for 5 time sit to stand today.  Progressing strength and mobility although still limited with specifically hip extension weakness.   Patient will benefit from continued skilled therapy services to address remaining unmet and partially met goals but will decrease to 1 time a week working towards discharge.  Patient wants to be able to do yard work such as cutting down some small trees and bushes and getting up leaves.       OBJECTIVE IMPAIRMENTS: Abnormal gait, decreased mobility, decreased ROM, decreased strength, impaired perceived functional ability, postural dysfunction, obesity, and pain.   ACTIVITY LIMITATIONS: carrying, lifting, bending, sitting, standing, squatting, sleeping, bathing, and locomotion level  PARTICIPATION LIMITATIONS: meal prep, cleaning, shopping, and community activity  REHAB POTENTIAL: Good  CLINICAL DECISION MAKING: Evolving/moderate complexity  EVALUATION COMPLEXITY: Moderate   GOALS: Goals reviewed with patient? No  SHORT TERM GOALS: Target date: 12/16/2023  patient will be independent with initial HEP  Baseline: Goal status: met  2.  Patient will report 30% improvement overall  Baseline:  Goal status: met   LONG TERM GOALS: Target date: 12/30/2023  Patient will be independent in self management strategies to improve quality of life and functional outcomes.  Baseline:  Goal status: in progress  2.  Patient will report 50% improvement overall  Baseline: 50 to 60% 01/24/24 Goal status:met  3.  Patient will improve 5 times sit to stand score to 15 sec or less to demonstrate improved functional mobility and increased leg strength.     Baseline: 31.38 sec; 12/27/23 16.31 sec; 8.88 sec Goal status: met  4.   Patient will increase bilateral leg MMT's to 5/5 to allow navigation of steps without gait deviation or loss of balance Baseline: see above Goal status: in progress   5.  Patient will be able to stand x 30 min to cook a meal without back pain >3/10 Baseline: 12/27/23 can stand 15 to 20 min; 01/24/24 still about 20 min pain 4/10 Goal status: in  progress   PLAN:  PT FREQUENCY: 1x/week  PT DURATION: 4 weeks  PLANNED INTERVENTIONS: [id number]- PT Re-evaluation, 97110-Therapeutic exercises, 97530- Therapeutic activity, 97112- Neuromuscular re-education, 97535- Self Care, [id number]- Manual therapy, [id number]- Gait training, [phone]- Orthotic Fit/training, [phone]- Canalith repositioning, [id number]- Aquatic Therapy, 97760- Splinting, [id number]- Wound care (first 20 sq cm), 97598- Wound care (each additional 20 sq cm)Patient/Family education, Balance training, Stair training, Taping, Dry Needling, Joint mobilization, Joint manipulation, Spinal manipulation, Spinal mobilization, Scar mobilization, and DME instructions. SABRA  PLAN FOR NEXT SESSION:  continue to progress core stabilization, postural strengthening and reduce pain and dysfunction.  Patient will benefit from continued skilled therapy services to address remaining unmet and partially met goals but will decrease to 1 x a week working towards discharge. 8:53 AM, 01/24/24 Vanna Sailer Small Jaidon Ellery MPT [facility] [place] [phone]

## 2024-01-31 ENCOUNTER — Ambulatory Visit (HOSPITAL_COMMUNITY)

## 2024-01-31 DIAGNOSIS — R29898 Other symptoms and signs involving the musculoskeletal system: Secondary | ICD-10-CM

## 2024-01-31 DIAGNOSIS — M6281 Muscle weakness (generalized): Secondary | ICD-10-CM

## 2024-01-31 DIAGNOSIS — M545 Low back pain, unspecified: Secondary | ICD-10-CM

## 2024-01-31 NOTE — Therapy (Signed)
 OUTPATIENT PHYSICAL THERAPY THORACOLUMBAR TREATMENT     Patient Name: Kendra Orozco MRN: [id number] DOB:1945/05/15, 78 y.o., female Today's Date: 01/31/2024  END OF SESSION:  PT End of Session - 01/31/24 1259     Visit Number 14    Number of Visits 17    Date for Recertification  02/21/24    Authorization Type HTA    Authorization Time Period no auth needed    PT Start Time 1257   late arrival   PT Stop Time 1335    PT Time Calculation (min) 38 min    Activity Tolerance Patient tolerated treatment well    Behavior During Therapy WFL for tasks assessed/performed                Past Medical History:  Diagnosis Date   Angio-edema    Anxiety    Arthritis    Asthma    Cancer (HCC)    skin and colon   GERD (gastroesophageal reflux disease)    Gout    Hypertension    Urticaria    Past Surgical History:  Procedure Laterality Date   ABDOMINAL HYSTERECTOMY     fibroid tumors   APPENDECTOMY     BACK SURGERY     BIOPSY  01/08/2022   Procedure: BIOPSY;  Surgeon: Shaaron Lamar HERO, MD;  Location: [facility];  Service: Endoscopy;;   BREAST CYST EXCISION Right    CATARACT EXTRACTION W/PHACO Right 11/20/2019   Procedure: CATARACT EXTRACTION PHACO AND INTRAOCULAR LENS PLACEMENT (IOC) RIGHT EYE;  Surgeon: Harrie Agent, MD;  Location: [facility];  Service: Ophthalmology;  Laterality: Right;  CDE: 17.30   CATARACT EXTRACTION W/PHACO Left 12/04/2019   Procedure: CATARACT EXTRACTION PHACO AND INTRAOCULAR LENS PLACEMENT LEFT EYE;  Surgeon: Harrie Agent, MD;  Location: [facility];  Service: Ophthalmology;  Laterality: Left;  CDE: 14.59   COLONOSCOPY  09/16/2007   MFM:Jwjo canal hemorrhoids, otherwise normal rectum left-sided diverticula and colonic mucosa appeared normal.   COLONOSCOPY N/A 03/02/2013   Dr. Rolfe adenoma. Colonic diverticulosis. Anal canal hemorrhoids-likely source of hematochezia. Surveillance 2019   COLONOSCOPY WITH PROPOFOL  N/A 07/29/2017   Non-bleeding  internal hemorrhoids, cecal AVMs.    COLONOSCOPY WITH PROPOFOL  N/A 03/23/2022   Procedure: COLONOSCOPY WITH PROPOFOL ;  Surgeon: Shaaron Lamar HERO, MD;  Location: [facility];  Service: Endoscopy;  Laterality: N/A;  9:00 am   ESOPHAGOGASTRODUODENOSCOPY  09/16/2007   MFM:Jrrzwuljuzi undulating Z-line, tiny distal esophageal  erosions consistent with mild erosive reflux esophagitis, patulous EG junction, small hiatal hernia, otherwise normal stomach, D1, and D2.   ESOPHAGOGASTRODUODENOSCOPY (EGD) WITH PROPOFOL  N/A 01/08/2022   Procedure: ESOPHAGOGASTRODUODENOSCOPY (EGD) WITH PROPOFOL ;  Surgeon: Shaaron Lamar HERO, MD;  [facility];  Service: Endoscopy;  Laterality: N/A;  2:30 pm   HEMORRHOID SURGERY     X 2    MALONEY DILATION N/A 01/08/2022   Procedure: MALONEY DILATION;  Surgeon: Shaaron Lamar HERO, MD;  [facility]: [facility];  Service: Endoscopy;  Laterality: N/A;   POLYPECTOMY  03/23/2022   Procedure: POLYPECTOMY;  Surgeon: Shaaron Lamar HERO, MD;  [facility]: [facility];  Service: Endoscopy;;   Patient Active Problem List   Diagnosis Date Noted   Chronic fatigue 10/10/2023   Chronic gouty arthritis 10/10/2023   Skin sensation disturbance 10/10/2023   Fibromyalgia 10/10/2023   Acute recurrent maxillary sinusitis 09/21/2023   Sensorineural hearing loss, bilateral 09/21/2023   Impacted cerumen of both ears 09/21/2023   Pain in right hip 08/22/2023  Candidal intertrigo 06/24/2023   Arthralgia of right ankle 02/21/2023   Tinnitus of both ears 01/30/2023   Chronic rhinitis 01/30/2023   Deviated nasal septum 01/30/2023   Hypertrophy of nasal turbinates 01/30/2023   Morbid obesity (HCC) 08/22/2022   Prediabetes 08/22/2022   Edema 02/21/2022   Hypokalemia 02/21/2022   IDA (iron deficiency anemia) 02/20/2022   Elevated alkaline phosphatase level 02/20/2022   Impaired fasting glucose 02/20/2022   Dysphagia 12/14/2021   Arthralgia of right knee 11/08/2021   Hidradenitis suppurativa of  anus 11/08/2021   Right upper quadrant pain 11/08/2021   Stage 3a chronic kidney disease (HCC) 11/08/2021   Nausea 06/09/2021   Polymyalgia rheumatica 12/22/2020   Gout 12/15/2020   Acute pharyngitis 10/20/2020   Dyspnea 10/20/2020   Sneezing 10/20/2020   Cough 10/05/2020   Abnormal white blood cell (WBC) count 09/15/2020   Chronic low back pain 09/15/2020   External hemorrhoids 09/15/2020   Generalized anxiety disorder 09/15/2020   Generalized osteoarthritis 09/15/2020   Mild persistent asthma 09/15/2020   Mixed hyperlipidemia 09/15/2020   Obesity 09/15/2020   Allergic rhinitis 09/15/2020   Hyponatremia 06/06/2020   COVID-19 virus infection 06/06/2020   HTN (hypertension) 06/06/2020   Prolapsed internal hemorrhoids, grade 3 05/12/2020   Constipation 04/15/2013   Rectal bleeding 02/11/2013   Gastroesophageal reflux disease 02/11/2013    PCP: Shona Norleen PEDLAR, MD  REFERRING PROVIDER: Margrette Taft BRAVO, MD  REFERRING DIAG: M54.50,G89.29 (ICD-10-CM) - Chronic low back pain, unspecified back pain laterality, unspecified whether sciatica present  Rationale for Evaluation and Treatment: Rehabilitation  THERAPY DIAG:  Other symptoms and signs involving the musculoskeletal system  Muscle weakness (generalized)  Low back pain, unspecified back pain laterality, unspecified chronicity, unspecified whether sciatica present  ONSET DATE: chronic back pain  SUBJECTIVE:                                                                                                                                                                                           SUBJECTIVE STATEMENT: Reports she arrived late today; thought her appointment was somewhere else.  Has had bronchitis over the past week.    Evaluation: History of back surgery years ago; chronic back pain.  Went to Dr. Margrette about her back; he took x-rays and referred to therapy.  History of right side weakness from the back and  then has had some right ankle issues; fx and multiple sprains; also states she has some leg weakness and perhaps varicose veins; difficulty standing more than 15 min at a time.    PERTINENT HISTORY:  L4-L5 back surgery 1984  Hemorrhoids History of gout  Allergy   shots/asthma   PAIN:  Are you having pain? Yes: NPRS scale: 4/10 sitting; 9-10/10 with prolonged standing Pain location: low back Pain description: sore and radiating  Aggravating factors: prolonged standing, prolonged sitting Relieving factors: muscle rub, sitting down and rest, walking  PRECAUTIONS: None   WEIGHT BEARING RESTRICTIONS: No  FALLS:  Has patient fallen in last 6 months? No  OCCUPATION: retired Careers adviser  PLOF: Independent  PATIENT GOALS: get stronger and be able to stand more than 15 min at at time  NEXT MD VISIT: PRN  OBJECTIVE:  Note: Objective measures were completed at Evaluation unless otherwise noted.  DIAGNOSTIC FINDINGS:  Lower back pain history of L4-5 surgery   5 views of the spine.  Coronal and sagittal plane abnormal alignment.  Facet arthritis grade 2 spondylo at L4-L5 or L5-S1, difficult to tell spot films suggested L5-S1   Impression grade 2 spondylolisthesis chronic degenerative changes in the facet joints spondylosis    PATIENT SURVEYS:  Modified Oswestry:  MODIFIED OSWESTRY DISABILITY SCALE  Date: 12/02/2023 Score  Total 22/50; 44%   Interpretation of scores: Score Category Description  0-20% Minimal Disability The patient can cope with most living activities. Usually no treatment is indicated apart from advice on lifting, sitting and exercise  21-40% Moderate Disability The patient experiences more pain and difficulty with sitting, lifting and standing. Travel and social life are more difficult and they may be disabled from work. Personal care, sexual activity and sleeping are not grossly affected, and the patient can usually be managed by conservative means  41-60%  Severe Disability Pain remains the main problem in this group, but activities of daily living are affected. These patients require a detailed investigation  61-80% Crippled Back pain impinges on all aspects of the patient's life. Positive intervention is required  81-100% Bed-bound  These patients are either bed-bound or exaggerating their symptoms  Bluford FORBES Zoe DELENA Karon DELENA, et al. Surgery versus conservative management of stable thoracolumbar fracture: the PRESTO feasibility RCT. [place] ([place]): [employer]; 2021 Nov. [facility] Technology Assessment, No. 25.62.) Appendix 3, Oswestry Disability Index category descriptors. Available from: [employer]cz  Minimally Clinically Important Difference (MCID) = 12.8%  COGNITION: Overall cognitive status: Within functional limits for tasks assessed     SENSATION: Pain down right leg; numbness down right leg    POSTURE: rounded shoulders and forward head  PALPATION: General soreness lumbar paraspinals right side glutes and piriformis  LUMBAR ROM: *pain  AROM eval 12/27/23 01/24/24  Flexion Fingertips to 1 above ankle* low back Fingertips to ankle Fingertips 1 from toes  Extension 20% available* more painful than forwards 30% available 35% available Pulling  Right lateral flexion Fingertips to knee joint line    Left lateral flexion Fingertips to knee joint line    Right rotation     Left rotation      (Blank rows = not tested)  LOWER EXTREMITY MMT:    MMT Right eval Left eval Right 12/27/23 Left 12/27/23 Right 01/24/24 Left 01/24/24  Hip flexion 4- 4+ 4+ 4+ 4+ 4+  Hip extension 4- 4- 4+ 4 4+ 4  Hip abduction        Hip adduction        Hip internal rotation        Hip external rotation        Knee flexion 4 4 4+ 4+ 4+ 5  Knee extension 4 4+ 5 5 5 5   Ankle dorsiflexion 4 4+ 5 5 5  5  Ankle plantarflexion        Ankle inversion        Ankle eversion         (Blank rows = not  tested)  FUNCTIONAL TESTS:  5 times sit to stand: 31.38 sec no UE assist  GAIT: Distance walked: 60 ft in clinic Assistive device utilized: None Level of assistance: Complete Independence Comments: decreased gait speed  TREATMENT DATE: 01/31/24 Nustep, seat 7 UE/LE, 5 minutes; dynamic warm up Heel raises on incline 2 x 10 Toe raises on decline 2 x 10 Hip vectors x 5 each side Seated hamstring stretch 5 x 20 Seated thoracic extension x 10      01/24/24 Nustep, level 4 resistance, seat 7 UE/LE, 5 minutes; dynamic warm up Progress note Modified Oswestry 16/50 32% 5 times sit to stand 8.88 sec no UE assist AROM and MMT's see above Goal review Standing hip extension 2 hold x 10 each   01/21/24 Nustep, level 4 resistance, seat 7 UE/LE, 6 minutes Standing:  GTB rows 2X10  GTB shoulder extensions 2X10  Pallof GTB 2X10 facing Rt only (unable to do Lt) Lateral step up and overs 6 inch step, no ankle weights 10X each 1 UE assist 3 way hip in parallel bars, 1 set of 5 reps bilateral, 3 holds no weights Walking marches high lift/walking bwds, 20 foot line, no ankle weights, 2 laps, pt cued for max LE ROM   01/14/24 Nustep, level 4 resistance, seat 7 UE/LE, 6 minutes Standing:  GTB rows 2X10  GTB shoulder extensions 2X10  Pallof GTB 2X10  Lateral step up and overs 6 inch step, 2# ankle weights 3 way hip in parallel bars, 2 set of 5 reps bilateral, 3 holds 2# weights Walking marches/walking bwds, 20 foot line, 2# ankle weights, 2 laps, pt cued for max LE ROM  01/08/2024  Therapeutic Exercise: -Nustep, level 4 resistance, pt cued for 70 SPM -Lateral step up and overs 6 inch step, 3lb ankle weights -Standing 3 way hip in parallel bars, 1 set of 5 reps bilateral, 3lb weights -Lat pull downs, 1 set of 8 reps, plate 3, pt cued for eccentric control Neuromuscular Re-education: -Walking marches/walking bwds, 20 foot line, 3lb ankle weights, 2 laps, pt cued for max LE  ROM -Pallof press with GTB at chest level, 1 set of 10 reps bilaterally, pt cued for sequencing  -Bird dogs, 1 set of 4 reps, bilaterally pt cued for sequencing and core activation  01/01/2014 Supine with legs on wedge in decompressed position x 5' to decrease pain with instruction on relaxation breathing LTR x 10 each Abdominal bracing 5 hold x 10 Nustep seat 7 x 5' level 2 Standing  scapular retractions Shoulder extensions Heel raises    12/31/2023  Treadmill, 5 minutes, grade 2 incline, 0.8 mph; cues for increased stride Standing:  RTB scapular retraction 10X  RTB Rows 10X  RTB shoulder extension 10X  Lat stretch bilaterally Supine:   bridges unable today due to cramping LTR 2 set of 10 reps bilaterally, pt cued to remain in pain free ROM Abdominal isometrics 20 reps, 3 second holds, pt cued for breathing pattern Quadruped: Birddog, 1 set of 5 reps, pt cued for neutral spine and increased core control Sit to stands with 5# KB pushout, 2 sets of 5 reps, pt cued for core activation    PATIENT EDUCATION:  Education details: Patient educated on exam findings, POC, scope of PT, HEP, and what to expect next visit. Person  educated: Patient Education method: Explanation, Demonstration, and Handouts Education comprehension: verbalized understanding, returned demonstration, verbal cues required, and tactile cues required  HOME EXERCISE PROGRAM:  Access Code: [email or web address] URL: [email or web address] Date: 12/02/2023 Prepared by: [facility] - Rehab Exercises - Supine Posterior Pelvic Tilt  - 2 x daily - 7 x weekly - 1 sets - 10 reps - 3 sec hold   -Decompression exercises 1-5   Access Code: [email or web address] URL: [email or web address] Date: 12/05/2023 Prepared by: Greig Fuse Exercises - Seated Hamstring Stretch with Chair  - 2 x daily - 7 x weekly - 3 reps - 30 sec hold -hip excursions 3 motions 10X each (no bouncing in stretch)  ASSESSMENT:  CLINICAL  IMPRESSION: Late arrival today.  Patient with some noticeable wheezing and shortness of breath today during treatment likely due to bronchitis.  Had to take several seated breaks.  She states MD gave her some medication for a nebulizer but it is expense so she limits how much she uses it.  Patient needs cues to avoid trunk substitution with hip vectors; she initially had some discomfort but that goes away after reminders to hold her core tight and maintain good posturing with hip vectors.  Patient wants to be able to do yard work such as cutting down some small trees and bushes and getting up leaves.       OBJECTIVE IMPAIRMENTS: Abnormal gait, decreased mobility, decreased ROM, decreased strength, impaired perceived functional ability, postural dysfunction, obesity, and pain.   ACTIVITY LIMITATIONS: carrying, lifting, bending, sitting, standing, squatting, sleeping, bathing, and locomotion level  PARTICIPATION LIMITATIONS: meal prep, cleaning, shopping, and community activity  REHAB POTENTIAL: Good  CLINICAL DECISION MAKING: Evolving/moderate complexity  EVALUATION COMPLEXITY: Moderate   GOALS: Goals reviewed with patient? No  SHORT TERM GOALS: Target date: 12/16/2023  patient will be independent with initial HEP  Baseline: Goal status: met  2.  Patient will report 30% improvement overall  Baseline:  Goal status: met   LONG TERM GOALS: Target date: 12/30/2023  Patient will be independent in self management strategies to improve quality of life and functional outcomes.  Baseline:  Goal status: in progress  2.  Patient will report 50% improvement overall  Baseline: 50 to 60% 01/24/24 Goal status:met  3.  Patient will improve 5 times sit to stand score to 15 sec or less to demonstrate improved functional mobility and increased leg strength.     Baseline: 31.38 sec; 12/27/23 16.31 sec; 8.88 sec Goal status: met  4.   Patient will increase bilateral leg MMT's to 5/5 to  allow navigation of steps without gait deviation or loss of balance Baseline: see above Goal status: in progress   5.  Patient will be able to stand x 30 min to cook a meal without back pain >3/10 Baseline: 12/27/23 can stand 15 to 20 min; 01/24/24 still about 20 min pain 4/10 Goal status: in progress   PLAN:  PT FREQUENCY: 1x/week  PT DURATION: 4 weeks  PLANNED INTERVENTIONS: [id number]- PT Re-evaluation, 97110-Therapeutic exercises, 97530- Therapeutic activity, 97112- Neuromuscular re-education, 97535- Self Care, [id number]- Manual therapy, [id number]- Gait training, [phone]- Orthotic Fit/training, [phone]- Canalith repositioning, [id number]- Aquatic Therapy, 97760- Splinting, [id number]- Wound care (first 20 sq cm), 97598- Wound care (each additional 20 sq cm)Patient/Family education, Balance training, Stair training, Taping, Dry Needling, Joint mobilization, Joint manipulation, Spinal manipulation, Spinal mobilization, Scar mobilization, and DME instructions. SABRA  PLAN FOR NEXT SESSION:  continue to progress core stabilization, postural strengthening and reduce pain and dysfunction.  Patient will  benefit from continued skilled therapy services to address remaining unmet and partially met goals but will decrease to 1 x a week working towards discharge.  1:34 PM, 01/31/24 Dinorah Masullo Small Ken Bonn MPT [facility] [place] [phone]

## 2024-02-05 ENCOUNTER — Ambulatory Visit (HOSPITAL_COMMUNITY)

## 2024-02-05 DIAGNOSIS — R29898 Other symptoms and signs involving the musculoskeletal system: Secondary | ICD-10-CM

## 2024-02-05 DIAGNOSIS — M6281 Muscle weakness (generalized): Secondary | ICD-10-CM

## 2024-02-05 DIAGNOSIS — M545 Low back pain, unspecified: Secondary | ICD-10-CM

## 2024-02-05 NOTE — Therapy (Signed)
 OUTPATIENT PHYSICAL THERAPY THORACOLUMBAR TREATMENT     Patient Name: Kendra Orozco MRN: [id number] DOB:[birth year], 78 y.o., female Today's Date: 02/05/2024  END OF SESSION:  PT End of Session - 02/05/24 1414     Visit Number 15    Number of Visits 17    Date for Recertification  02/21/24    Authorization Type HTA    Authorization Time Period no auth needed    PT Start Time 1415    PT Stop Time 1455    PT Time Calculation (min) 40 min    Activity Tolerance Patient tolerated treatment well    Behavior During Therapy WFL for tasks assessed/performed                Past Medical History:  Diagnosis Date   Angio-edema    Anxiety    Arthritis    Asthma    Cancer (HCC)    skin and colon   GERD (gastroesophageal reflux disease)    Gout    Hypertension    Urticaria    Past Surgical History:  Procedure Laterality Date   ABDOMINAL HYSTERECTOMY     fibroid tumors   APPENDECTOMY     BACK SURGERY     BIOPSY  01/08/2022   Procedure: BIOPSY;  Surgeon: Shaaron Lamar HERO, MD;  [facility]: [facility];  Service: Endoscopy;;   BREAST CYST EXCISION Right    CATARACT EXTRACTION W/PHACO Right 11/20/2019   Procedure: CATARACT EXTRACTION PHACO AND INTRAOCULAR LENS PLACEMENT (IOC) RIGHT EYE;  Surgeon: Harrie Agent, MD;  Location: [facility];  Service: Ophthalmology;  Laterality: Right;  CDE: 17.30   CATARACT EXTRACTION W/PHACO Left 12/04/2019   Procedure: CATARACT EXTRACTION PHACO AND INTRAOCULAR LENS PLACEMENT LEFT EYE;  Surgeon: Harrie Agent, MD;  Location: [facility];  Service: Ophthalmology;  Laterality: Left;  CDE: 14.59   COLONOSCOPY  09/16/2007   MFM:Jwjo canal hemorrhoids, otherwise normal rectum left-sided diverticula and colonic mucosa appeared normal.   COLONOSCOPY N/A 03/02/2013   Dr. Rolfe adenoma. Colonic diverticulosis. Anal canal hemorrhoids-likely source of hematochezia. Surveillance 2019   COLONOSCOPY WITH PROPOFOL  N/A 07/29/2017   Non-bleeding internal  hemorrhoids, cecal AVMs.    COLONOSCOPY WITH PROPOFOL  N/A 03/23/2022   Procedure: COLONOSCOPY WITH PROPOFOL ;  Surgeon: Shaaron Lamar HERO, MD;  [facility]: [facility];  Service: Endoscopy;  Laterality: N/A;  9:00 am   ESOPHAGOGASTRODUODENOSCOPY  09/16/2007   MFM:Jrrzwuljuzi undulating Z-line, tiny distal esophageal  erosions consistent with mild erosive reflux esophagitis, patulous EG junction, small hiatal hernia, otherwise normal stomach, D1, and D2.   ESOPHAGOGASTRODUODENOSCOPY (EGD) WITH PROPOFOL  N/A 01/08/2022   Procedure: ESOPHAGOGASTRODUODENOSCOPY (EGD) WITH PROPOFOL ;  Surgeon: Shaaron Lamar HERO, MD;  [facility];  Service: Endoscopy;  Laterality: N/A;  2:30 pm   HEMORRHOID SURGERY     X 2    MALONEY DILATION N/A 01/08/2022   Procedure: MALONEY DILATION;  Surgeon: Shaaron Lamar HERO, MD;  [facility]: [facility];  Service: Endoscopy;  Laterality: N/A;   POLYPECTOMY  03/23/2022   Procedure: POLYPECTOMY;  Surgeon: Shaaron Lamar HERO, MD;  [facility];  Service: Endoscopy;;   Patient Active Problem List   Diagnosis Date Noted   Chronic fatigue 10/10/2023   Chronic gouty arthritis 10/10/2023   Skin sensation disturbance 10/10/2023   Fibromyalgia 10/10/2023   Acute recurrent maxillary sinusitis 09/21/2023   Sensorineural hearing loss, bilateral 09/21/2023   Impacted cerumen of both ears 09/21/2023   Pain in right hip 08/22/2023   Candidal intertrigo 06/24/2023  Arthralgia of right ankle 02/21/2023   Tinnitus of both ears 01/30/2023   Chronic rhinitis 01/30/2023   Deviated nasal septum 01/30/2023   Hypertrophy of nasal turbinates 01/30/2023   Morbid obesity (HCC) 08/22/2022   Prediabetes 08/22/2022   Edema 02/21/2022   Hypokalemia 02/21/2022   IDA (iron deficiency anemia) 02/20/2022   Elevated alkaline phosphatase level 02/20/2022   Impaired fasting glucose 02/20/2022   Dysphagia 12/14/2021   Arthralgia of right knee 11/08/2021   Hidradenitis suppurativa of anus  11/08/2021   Right upper quadrant pain 11/08/2021   Stage 3a chronic kidney disease (HCC) 11/08/2021   Nausea 06/09/2021   Polymyalgia rheumatica 12/22/2020   Gout 12/15/2020   Acute pharyngitis 10/20/2020   Dyspnea 10/20/2020   Sneezing 10/20/2020   Cough 10/05/2020   Abnormal white blood cell (WBC) count 09/15/2020   Chronic low back pain 09/15/2020   External hemorrhoids 09/15/2020   Generalized anxiety disorder 09/15/2020   Generalized osteoarthritis 09/15/2020   Mild persistent asthma 09/15/2020   Mixed hyperlipidemia 09/15/2020   Obesity 09/15/2020   Allergic rhinitis 09/15/2020   Hyponatremia 06/06/2020   COVID-19 virus infection 06/06/2020   HTN (hypertension) 06/06/2020   Prolapsed internal hemorrhoids, grade 3 05/12/2020   Constipation 04/15/2013   Rectal bleeding 02/11/2013   Gastroesophageal reflux disease 02/11/2013    PCP: Shona Norleen PEDLAR, MD  REFERRING PROVIDER: Margrette Taft BRAVO, MD  REFERRING DIAG: M54.50,G89.29 (ICD-10-CM) - Chronic low back pain, unspecified back pain laterality, unspecified whether sciatica present  Rationale for Evaluation and Treatment: Rehabilitation  THERAPY DIAG:  Other symptoms and signs involving the musculoskeletal system  Muscle weakness (generalized)  Low back pain, unspecified back pain laterality, unspecified chronicity, unspecified whether sciatica present  ONSET DATE: chronic back pain  SUBJECTIVE:                                                                                                                                                                                           SUBJECTIVE STATEMENT: Stiff and hurts like a toothache in her back today; 2-3/10 pain   Evaluation: History of back surgery years ago; chronic back pain.  Went to Dr. Margrette about her back; he took x-rays and referred to therapy.  History of right side weakness from the back and then has had some right ankle issues; fx and multiple  sprains; also states she has some leg weakness and perhaps varicose veins; difficulty standing more than 15 min at a time.    PERTINENT HISTORY:  L4-L5 back surgery 1984  Hemorrhoids History of gout  Allergy  shots/asthma   PAIN:  Are you having pain? Yes: NPRS scale: 4/10  sitting; 9-10/10 with prolonged standing Pain location: low back Pain description: sore and radiating  Aggravating factors: prolonged standing, prolonged sitting Relieving factors: muscle rub, sitting down and rest, walking  PRECAUTIONS: None   WEIGHT BEARING RESTRICTIONS: No  FALLS:  Has patient fallen in last 6 months? No  OCCUPATION: retired Careers adviser  PLOF: Independent  PATIENT GOALS: get stronger and be able to stand more than 15 min at at time  NEXT MD VISIT: PRN  OBJECTIVE:  Note: Objective measures were completed at Evaluation unless otherwise noted.  DIAGNOSTIC FINDINGS:  Lower back pain history of L4-5 surgery   5 views of the spine.  Coronal and sagittal plane abnormal alignment.  Facet arthritis grade 2 spondylo at L4-L5 or L5-S1, difficult to tell spot films suggested L5-S1   Impression grade 2 spondylolisthesis chronic degenerative changes in the facet joints spondylosis    PATIENT SURVEYS:  Modified Oswestry:  MODIFIED OSWESTRY DISABILITY SCALE  Date: 12/02/2023 Score  Total 22/50; 44%   Interpretation of scores: Score Category Description  0-20% Minimal Disability The patient can cope with most living activities. Usually no treatment is indicated apart from advice on lifting, sitting and exercise  21-40% Moderate Disability The patient experiences more pain and difficulty with sitting, lifting and standing. Travel and social life are more difficult and they may be disabled from work. Personal care, sexual activity and sleeping are not grossly affected, and the patient can usually be managed by conservative means  41-60% Severe Disability Pain remains the main problem in this  group, but activities of daily living are affected. These patients require a detailed investigation  61-80% Crippled Back pain impinges on all aspects of the patient's life. Positive intervention is required  81-100% Bed-bound  These patients are either bed-bound or exaggerating their symptoms  Bluford FORBES Zoe DELENA Karon DELENA, et al. Surgery versus conservative management of stable thoracolumbar fracture: the PRESTO feasibility RCT. [place] ([place]): [employer]; 2021 Nov. [facility] Technology Assessment, No. 25.62.) Appendix 3, Oswestry Disability Index category descriptors. Available from: Find[employer]el[employer]cz  Minimally Clinically Important Difference (MCID) = 12.8%  COGNITION: Overall cognitive status: Within functional limits for tasks assessed     SENSATION: Pain down right leg; numbness down right leg    POSTURE: rounded shoulders and forward head  PALPATION: General soreness lumbar paraspinals right side glutes and piriformis  LUMBAR ROM: *pain  AROM eval 12/27/23 01/24/24  Flexion Fingertips to 1 above ankle* low back Fingertips to ankle Fingertips 1 from toes  Extension 20% available* more painful than forwards 30% available 35% available Pulling  Right lateral flexion Fingertips to knee joint line    Left lateral flexion Fingertips to knee joint line    Right rotation     Left rotation      (Blank rows = not tested)  LOWER EXTREMITY MMT:    MMT Right eval Left eval Right 12/27/23 Left 12/27/23 Right 01/24/24 Left 01/24/24  Hip flexion 4- 4+ 4+ 4+ 4+ 4+  Hip extension 4- 4- 4+ 4 4+ 4  Hip abduction        Hip adduction        Hip internal rotation        Hip external rotation        Knee flexion 4 4 4+ 4+ 4+ 5  Knee extension 4 4+ 5 5 5 5   Ankle dorsiflexion 4 4+ 5 5 5 5   Ankle plantarflexion        Ankle inversion  Ankle eversion         (Blank rows = not tested)  FUNCTIONAL TESTS:  5 times sit to stand: 31.38  sec no UE assist  GAIT: Distance walked: 60 ft in clinic Assistive device utilized: None Level of assistance: Complete Independence Comments: decreased gait speed  TREATMENT DATE: 02/05/24 Nustep seat 7 UE/LE x 5 minutes level 3 dynamic warm up Thoracic extension over chair 10 x 3hold Thoracic and lumbar rotation in chair x 1 caused a cramp so discontinued Standing lumbar extension over bar x 10 Standing heel raises on incline 2 x 10 Standing toe raises on decline 2 x 10 Tandem stance 2 x 30 Hamstring stretch in sitting 3 x 20 each SLS 3 x 10 each Slant board 2 x 20   01/31/24 Nustep, seat 7 UE/LE, 5 minutes; dynamic warm up Heel raises on incline 2 x 10 Toe raises on decline 2 x 10 Hip vectors x 5 each side Seated hamstring stretch 5 x 20 Seated thoracic extension x 10   01/24/24 Nustep, level 4 resistance, seat 7 UE/LE, 5 minutes; dynamic warm up Progress note Modified Oswestry 16/50 32% 5 times sit to stand 8.88 sec no UE assist AROM and MMT's see above Goal review Standing hip extension 2 hold x 10 each   01/21/24 Nustep, level 4 resistance, seat 7 UE/LE, 6 minutes Standing:  GTB rows 2X10  GTB shoulder extensions 2X10  Pallof GTB 2X10 facing Rt only (unable to do Lt) Lateral step up and overs 6 inch step, no ankle weights 10X each 1 UE assist 3 way hip in parallel bars, 1 set of 5 reps bilateral, 3 holds no weights Walking marches high lift/walking bwds, 20 foot line, no ankle weights, 2 laps, pt cued for max LE ROM   01/14/24 Nustep, level 4 resistance, seat 7 UE/LE, 6 minutes Standing:  GTB rows 2X10  GTB shoulder extensions 2X10  Pallof GTB 2X10  Lateral step up and overs 6 inch step, 2# ankle weights 3 way hip in parallel bars, 2 set of 5 reps bilateral, 3 holds 2# weights Walking marches/walking bwds, 20 foot line, 2# ankle weights, 2 laps, pt cued for max LE ROM  01/08/2024  Therapeutic Exercise: -Nustep, level 4 resistance, pt cued for  70 SPM -Lateral step up and overs 6 inch step, 3lb ankle weights -Standing 3 way hip in parallel bars, 1 set of 5 reps bilateral, 3lb weights -Lat pull downs, 1 set of 8 reps, plate 3, pt cued for eccentric control Neuromuscular Re-education: -Walking marches/walking bwds, 20 foot line, 3lb ankle weights, 2 laps, pt cued for max LE ROM -Pallof press with GTB at chest level, 1 set of 10 reps bilaterally, pt cued for sequencing  -Bird dogs, 1 set of 4 reps, bilaterally pt cued for sequencing and core activation     PATIENT EDUCATION:  Education details: Patient educated on exam findings, POC, scope of PT, HEP, and what to expect next visit. Person educated: Patient Education method: Explanation, Demonstration, and Handouts Education comprehension: verbalized understanding, returned demonstration, verbal cues required, and tactile cues required  HOME EXERCISE PROGRAM:  Access Code: [email or web address] URL: [email or web address] Date: 12/02/2023 Prepared by: [facility] - Rehab Exercises - Supine Posterior Pelvic Tilt  - 2 x daily - 7 x weekly - 1 sets - 10 reps - 3 sec hold   -Decompression exercises 1-5   Access Code: [email or web address] URL: [email or web address] Date: 12/05/2023 Prepared by: Greig Fuse Exercises - Seated Hamstring Stretch with Chair  -  2 x daily - 7 x weekly - 3 reps - 30 sec hold -hip excursions 3 motions 10X each (no bouncing in stretch)  ASSESSMENT:  CLINICAL IMPRESSION: Today's session with focus on spinal mobility and core and postural strengthening.  She is still somewhat short of breath during activity today; takes a few short seated rest breaks.  Trial of thoracic rotation today but causes a cramp in her left side so discontinued.  Added some balance activity today with tandem and single leg stance.   Max challenge with SLS today.  Patient wants to be able to do yard work such as cutting down some small trees and bushes and getting up leaves.        OBJECTIVE IMPAIRMENTS: Abnormal gait, decreased mobility, decreased ROM, decreased strength, impaired perceived functional ability, postural dysfunction, obesity, and pain.   ACTIVITY LIMITATIONS: carrying, lifting, bending, sitting, standing, squatting, sleeping, bathing, and locomotion level  PARTICIPATION LIMITATIONS: meal prep, cleaning, shopping, and community activity  REHAB POTENTIAL: Good  CLINICAL DECISION MAKING: Evolving/moderate complexity  EVALUATION COMPLEXITY: Moderate   GOALS: Goals reviewed with patient? No  SHORT TERM GOALS: Target date: 12/16/2023  patient will be independent with initial HEP  Baseline: Goal status: met  2.  Patient will report 30% improvement overall  Baseline:  Goal status: met   LONG TERM GOALS: Target date: 12/30/2023  Patient will be independent in self management strategies to improve quality of life and functional outcomes.  Baseline:  Goal status: in progress  2.  Patient will report 50% improvement overall  Baseline: 50 to 60% 01/24/24 Goal status:met  3.  Patient will improve 5 times sit to stand score to 15 sec or less to demonstrate improved functional mobility and increased leg strength.     Baseline: 31.38 sec; 12/27/23 16.31 sec; 8.88 sec Goal status: met  4.   Patient will increase bilateral leg MMT's to 5/5 to allow navigation of steps without gait deviation or loss of balance Baseline: see above Goal status: in progress   5.  Patient will be able to stand x 30 min to cook a meal without back pain >3/10 Baseline: 12/27/23 can stand 15 to 20 min; 01/24/24 still about 20 min pain 4/10 Goal status: in progress   PLAN:  PT FREQUENCY: 1x/week  PT DURATION: 4 weeks  PLANNED INTERVENTIONS: [id number]- PT Re-evaluation, 97110-Therapeutic exercises, 97530- Therapeutic activity, 97112- Neuromuscular re-education, 97535- Self Care, [id number]- Manual therapy, [id number]- Gait training, [phone]- Orthotic Fit/training, [phone]-  Canalith repositioning, [id number]- Aquatic Therapy, 97760- Splinting, [id number]- Wound care (first 20 sq cm), 97598- Wound care (each additional 20 sq cm)Patient/Family education, Balance training, Stair training, Taping, Dry Needling, Joint mobilization, Joint manipulation, Spinal manipulation, Spinal mobilization, Scar mobilization, and DME instructions. SABRA  PLAN FOR NEXT SESSION:  continue to progress core stabilization, postural strengthening and reduce pain and dysfunction.  Patient will benefit from continued skilled therapy services to address remaining unmet and partially met goals but will decrease to 1 x a week working towards discharge.  2:55 PM, 02/05/24 Zamiya Dillard Small Osiah Haring MPT [facility] [place] [phone]

## 2024-02-07 ENCOUNTER — Ambulatory Visit (INDEPENDENT_AMBULATORY_CARE_PROVIDER_SITE_OTHER): Payer: Self-pay

## 2024-02-07 DIAGNOSIS — J309 Allergic rhinitis, unspecified: Secondary | ICD-10-CM | POA: Diagnosis not present

## 2024-02-07 MED ORDER — IPRATROPIUM BROMIDE 0.06 % NA SOLN: 2.0000 | Freq: Two times a day (BID) | NASAL | 2 refills | Status: DC

## 2024-02-07 NOTE — Progress Notes (Signed)
 Patient came in for allergy  injection. Requested refills of Ipratropium. It has now been sent into [place].

## 2024-02-12 ENCOUNTER — Ambulatory Visit (HOSPITAL_COMMUNITY)

## 2024-02-12 ENCOUNTER — Encounter (HOSPITAL_COMMUNITY): Payer: Self-pay

## 2024-02-12 DIAGNOSIS — M545 Low back pain, unspecified: Secondary | ICD-10-CM

## 2024-02-12 DIAGNOSIS — R29898 Other symptoms and signs involving the musculoskeletal system: Secondary | ICD-10-CM

## 2024-02-12 DIAGNOSIS — M6281 Muscle weakness (generalized): Secondary | ICD-10-CM

## 2024-02-12 NOTE — Therapy (Signed)
 OUTPATIENT PHYSICAL THERAPY THORACOLUMBAR TREATMENT     Patient Name: Kendra Orozco MRN: [id number] DOB:Jun 22, 1945, 78 y.o., female Today's Date: 02/12/2024  END OF SESSION:  PT End of Session - 02/12/24 1332     Visit Number 16    Number of Visits 17    Date for Recertification  02/21/24    Authorization Type HTA    Authorization Time Period no auth needed    PT Start Time 1333                Past Medical History:  Diagnosis Date   Angio-edema    Anxiety    Arthritis    Asthma    Cancer (HCC)    skin and colon   GERD (gastroesophageal reflux disease)    Gout    Hypertension    Urticaria    Past Surgical History:  Procedure Laterality Date   ABDOMINAL HYSTERECTOMY     fibroid tumors   APPENDECTOMY     BACK SURGERY     BIOPSY  01/08/2022   Procedure: BIOPSY;  Surgeon: Shaaron Lamar HERO, MD;  Location: [facility];  Service: Endoscopy;;   BREAST CYST EXCISION Right    CATARACT EXTRACTION W/PHACO Right 11/20/2019   Procedure: CATARACT EXTRACTION PHACO AND INTRAOCULAR LENS PLACEMENT (IOC) RIGHT EYE;  Surgeon: Harrie Agent, MD;  Location: [facility];  Service: Ophthalmology;  Laterality: Right;  CDE: 17.30   CATARACT EXTRACTION W/PHACO Left 12/04/2019   Procedure: CATARACT EXTRACTION PHACO AND INTRAOCULAR LENS PLACEMENT LEFT EYE;  Surgeon: Harrie Agent, MD;  Location: [facility];  Service: Ophthalmology;  Laterality: Left;  CDE: 14.59   COLONOSCOPY  09/16/2007   MFM:Jwjo canal hemorrhoids, otherwise normal rectum left-sided diverticula and colonic mucosa appeared normal.   COLONOSCOPY N/A 03/02/2013   Dr. Rolfe adenoma. Colonic diverticulosis. Anal canal hemorrhoids-likely source of hematochezia. Surveillance 2019   COLONOSCOPY WITH PROPOFOL  N/A 07/29/2017   Non-bleeding internal hemorrhoids, cecal AVMs.    COLONOSCOPY WITH PROPOFOL  N/A 03/23/2022   Procedure: COLONOSCOPY WITH PROPOFOL ;  Surgeon: Shaaron Lamar HERO, MD;  Location: [facility];  Service:  Endoscopy;  Laterality: N/A;  9:00 am   ESOPHAGOGASTRODUODENOSCOPY  09/16/2007   MFM:Jrrzwuljuzi undulating Z-line, tiny distal esophageal  erosions consistent with mild erosive reflux esophagitis, patulous EG junction, small hiatal hernia, otherwise normal stomach, D1, and D2.   ESOPHAGOGASTRODUODENOSCOPY (EGD) WITH PROPOFOL  N/A 01/08/2022   Procedure: ESOPHAGOGASTRODUODENOSCOPY (EGD) WITH PROPOFOL ;  Surgeon: Shaaron Lamar HERO, MD;  [facility];  Service: Endoscopy;  Laterality: N/A;  2:30 pm   HEMORRHOID SURGERY     X 2    MALONEY DILATION N/A 01/08/2022   Procedure: MALONEY DILATION;  Surgeon: Shaaron Lamar HERO, MD;  [facility]: [facility];  Service: Endoscopy;  Laterality: N/A;   POLYPECTOMY  03/23/2022   Procedure: POLYPECTOMY;  Surgeon: Shaaron Lamar HERO, MD;  [facility]: [facility];  Service: Endoscopy;;   Patient Active Problem List   Diagnosis Date Noted   Chronic fatigue 10/10/2023   Chronic gouty arthritis 10/10/2023   Skin sensation disturbance 10/10/2023   Fibromyalgia 10/10/2023   Acute recurrent maxillary sinusitis 09/21/2023   Sensorineural hearing loss, bilateral 09/21/2023   Impacted cerumen of both ears 09/21/2023   Pain in right hip 08/22/2023   Candidal intertrigo 06/24/2023   Arthralgia of right ankle 02/21/2023   Tinnitus of both ears 01/30/2023   Chronic rhinitis 01/30/2023   Deviated nasal septum 01/30/2023   Hypertrophy of nasal turbinates 01/30/2023   Morbid  obesity (HCC) 08/22/2022   Prediabetes 08/22/2022   Edema 02/21/2022   Hypokalemia 02/21/2022   IDA (iron deficiency anemia) 02/20/2022   Elevated alkaline phosphatase level 02/20/2022   Impaired fasting glucose 02/20/2022   Dysphagia 12/14/2021   Arthralgia of right knee 11/08/2021   Hidradenitis suppurativa of anus 11/08/2021   Right upper quadrant pain 11/08/2021   Stage 3a chronic kidney disease (HCC) 11/08/2021   Nausea 06/09/2021   Polymyalgia rheumatica 12/22/2020   Gout 12/15/2020    Acute pharyngitis 10/20/2020   Dyspnea 10/20/2020   Sneezing 10/20/2020   Cough 10/05/2020   Abnormal white blood cell (WBC) count 09/15/2020   Chronic low back pain 09/15/2020   External hemorrhoids 09/15/2020   Generalized anxiety disorder 09/15/2020   Generalized osteoarthritis 09/15/2020   Mild persistent asthma 09/15/2020   Mixed hyperlipidemia 09/15/2020   Obesity 09/15/2020   Allergic rhinitis 09/15/2020   Hyponatremia 06/06/2020   COVID-19 virus infection 06/06/2020   HTN (hypertension) 06/06/2020   Prolapsed internal hemorrhoids, grade 3 05/12/2020   Constipation 04/15/2013   Rectal bleeding 02/11/2013   Gastroesophageal reflux disease 02/11/2013    PCP: Shona Norleen PEDLAR, MD  REFERRING PROVIDER: Margrette Taft BRAVO, MD  REFERRING DIAG: M54.50,G89.29 (ICD-10-CM) - Chronic low back pain, unspecified back pain laterality, unspecified whether sciatica present  Rationale for Evaluation and Treatment: Rehabilitation  THERAPY DIAG:  Other symptoms and signs involving the musculoskeletal system  Muscle weakness (generalized)  Low back pain, unspecified back pain laterality, unspecified chronicity, unspecified whether sciatica present  ONSET DATE: chronic back pain  SUBJECTIVE:                                                                                                                                                                                           SUBJECTIVE STATEMENT: Reports of stiffness in her back today, feels the weather plays a part.  Current pain scale 2-3/10.   Evaluation: History of back surgery years ago; chronic back pain.  Went to Dr. Margrette about her back; he took x-rays and referred to therapy.  History of right side weakness from the back and then has had some right ankle issues; fx and multiple sprains; also states she has some leg weakness and perhaps varicose veins; difficulty standing more than 15 min at a time.    PERTINENT HISTORY:   L4-L5 back surgery 1984  Hemorrhoids History of gout  Allergy  shots/asthma   PAIN:  Are you having pain? Yes: NPRS scale: 2-3/10; 9-10/10 with prolonged standing Pain location: low back Pain description: sore and radiating  Aggravating factors: prolonged standing, prolonged sitting Relieving factors: muscle rub, sitting down and  rest, walking  PRECAUTIONS: None   WEIGHT BEARING RESTRICTIONS: No  FALLS:  Has patient fallen in last 6 months? No  OCCUPATION: retired careers adviser  PLOF: Independent  PATIENT GOALS: get stronger and be able to stand more than 15 min at at time  NEXT MD VISIT: PRN  OBJECTIVE:  Note: Objective measures were completed at Evaluation unless otherwise noted.  DIAGNOSTIC FINDINGS:  Lower back pain history of L4-5 surgery   5 views of the spine.  Coronal and sagittal plane abnormal alignment.  Facet arthritis grade 2 spondylo at L4-L5 or L5-S1, difficult to tell spot films suggested L5-S1   Impression grade 2 spondylolisthesis chronic degenerative changes in the facet joints spondylosis    PATIENT SURVEYS:  Modified Oswestry:  MODIFIED OSWESTRY DISABILITY SCALE  Date: 12/02/2023 Score  Total 22/50; 44%   Interpretation of scores: Score Category Description  0-20% Minimal Disability The patient can cope with most living activities. Usually no treatment is indicated apart from advice on lifting, sitting and exercise  21-40% Moderate Disability The patient experiences more pain and difficulty with sitting, lifting and standing. Travel and social life are more difficult and they may be disabled from work. Personal care, sexual activity and sleeping are not grossly affected, and the patient can usually be managed by conservative means  41-60% Severe Disability Pain remains the main problem in this group, but activities of daily living are affected. These patients require a detailed investigation  61-80% Crippled Back pain impinges on all aspects of  the patient's life. Positive intervention is required  81-100% Bed-bound  These patients are either bed-bound or exaggerating their symptoms  Bluford FORBES Zoe DELENA Karon DELENA, et al. Surgery versus conservative management of stable thoracolumbar fracture: the PRESTO feasibility RCT. [place] ([place]): [employer]; 2021 Nov. [facility] Technology Assessment, No. 25.62.) Appendix 3, Oswestry Disability Index category descriptors. Available from: Findjewelers.cz  Minimally Clinically Important Difference (MCID) = 12.8%  COGNITION: Overall cognitive status: Within functional limits for tasks assessed     SENSATION: Pain down right leg; numbness down right leg    POSTURE: rounded shoulders and forward head  PALPATION: General soreness lumbar paraspinals right side glutes and piriformis  LUMBAR ROM: *pain  AROM eval 12/27/23 01/24/24  Flexion Fingertips to 1 above ankle* low back Fingertips to ankle Fingertips 1 from toes  Extension 20% available* more painful than forwards 30% available 35% available Pulling  Right lateral flexion Fingertips to knee joint line    Left lateral flexion Fingertips to knee joint line    Right rotation     Left rotation      (Blank rows = not tested)  LOWER EXTREMITY MMT:    MMT Right eval Left eval Right 12/27/23 Left 12/27/23 Right 01/24/24 Left 01/24/24  Hip flexion 4- 4+ 4+ 4+ 4+ 4+  Hip extension 4- 4- 4+ 4 4+ 4  Hip abduction        Hip adduction        Hip internal rotation        Hip external rotation        Knee flexion 4 4 4+ 4+ 4+ 5  Knee extension 4 4+ 5 5 5 5   Ankle dorsiflexion 4 4+ 5 5 5 5   Ankle plantarflexion        Ankle inversion        Ankle eversion         (Blank rows = not tested)  FUNCTIONAL TESTS:  5 times sit to  stand: 31.38 sec no UE assist  GAIT: Distance walked: 60 ft in clinic Assistive device utilized: None Level of assistance: Complete Independence Comments:  decreased gait speed  TREATMENT DATE: 02/12/24: Nustep, level 5 resistance, seat 7 UE/LE, 5 minutes; [place]; average SPM 77 3D hip excursion ( STS, lumbar extension, rotation and sidebend) 10x Toe tapping 6in step height intermittent 1 HHA 2x 10 Lateal toe tapping standing between 6 and 8 in step 15x March on foot with 2# on foot DF 10x 5 intermittent HHA  02/05/24 Nustep seat 7 UE/LE x 5 minutes level 3 dynamic warm up Thoracic extension over chair 10 x 3hold Thoracic and lumbar rotation in chair x 1 caused a cramp so discontinued Standing lumbar extension over bar x 10 Standing heel raises on incline 2 x 10 Standing toe raises on decline 2 x 10 Tandem stance 2 x 30 Hamstring stretch in sitting 3 x 20 each SLS 3 x 10 each Slant board 2 x 20   01/31/24 Nustep, seat 7 UE/LE, 5 minutes; dynamic warm up Heel raises on incline 2 x 10 Toe raises on decline 2 x 10 Hip vectors x 5 each side Seated hamstring stretch 5 x 20 Seated thoracic extension x 10   01/24/24 Nustep, level 4 resistance, seat 7 UE/LE, 5 minutes; dynamic warm up Progress note Modified Oswestry 16/50 32% 5 times sit to stand 8.88 sec no UE assist AROM and MMT's see above Goal review Standing hip extension 2 hold x 10 each   01/21/24 Nustep, level 4 resistance, seat 7 UE/LE, 6 minutes Standing:  GTB rows 2X10  GTB shoulder extensions 2X10  Pallof GTB 2X10 facing Rt only (unable to do Lt) Lateral step up and overs 6 inch step, no ankle weights 10X each 1 UE assist 3 way hip in parallel bars, 1 set of 5 reps bilateral, 3 holds no weights Walking marches high lift/walking bwds, 20 foot line, no ankle weights, 2 laps, pt cued for max LE ROM   01/14/24 Nustep, level 4 resistance, seat 7 UE/LE, 6 minutes Standing:  GTB rows 2X10  GTB shoulder extensions 2X10  Pallof GTB 2X10  Lateral step up and overs 6 inch step, 2# ankle weights 3 way hip in parallel bars, 2 set of 5 reps bilateral, 3  holds 2# weights Walking marches/walking bwds, 20 foot line, 2# ankle weights, 2 laps, pt cued for max LE ROM  01/08/2024  Therapeutic Exercise: -Nustep, level 4 resistance, pt cued for 70 SPM -Lateral step up and overs 6 inch step, 3lb ankle weights -Standing 3 way hip in parallel bars, 1 set of 5 reps bilateral, 3lb weights -Lat pull downs, 1 set of 8 reps, plate 3, pt cued for eccentric control Neuromuscular Re-education: -Walking marches/walking bwds, 20 foot line, 3lb ankle weights, 2 laps, pt cued for max LE ROM -Pallof press with GTB at chest level, 1 set of 10 reps bilaterally, pt cued for sequencing  -Bird dogs, 1 set of 4 reps, bilaterally pt cued for sequencing and core activation     PATIENT EDUCATION:  Education details: Patient educated on exam findings, POC, scope of PT, HEP, and what to expect next visit. Person educated: Patient Education method: Explanation, Demonstration, and Handouts Education comprehension: verbalized understanding, returned demonstration, verbal cues required, and tactile cues required  HOME EXERCISE PROGRAM:  Access Code: [email or web address] URL: [email or web address] Date: 12/02/2023 Prepared by: [facility] - [facility]hab Exercises - Supine Posterior Pelvic Tilt  - 2 x daily - 7 x  weekly - 1 sets - 10 reps - 3 sec hold   -Decompression exercises 1-5   Access Code: [email or web address] URL: [email or web address] Date: 12/05/2023 Prepared by: Greig Fuse Exercises - Seated Hamstring Stretch with Chair  - 2 x daily - 7 x weekly - 3 reps - 30 sec hold -hip excursions 3 motions 10X each (no bouncing in stretch)  02/12/24: - Standing Tandem Balance with Counter Support  - 2 x daily - 7 x weekly - 1 sets - 3 reps - 30 hold - Single Leg Stance  - 2 x daily - 7 x weekly - 1 sets - 3 reps - 30 hold  ASSESSMENT:  CLINICAL IMPRESSION:  Began session on Nustep for dynamic warm up.  Session focus with spinal mobilitiy, proximal strengthening and  balance activities.  Added standing balance activities that required intermittent HHA.  Reviewed current exercise program with additional exercises to address balance given with printout and verbalized understanding.  No reports of pain through session, was limited by activity tolerance and some heavy breathing with cueing to slow down and improve breathing with task.    OBJECTIVE IMPAIRMENTS: Abnormal gait, decreased mobility, decreased ROM, decreased strength, impaired perceived functional ability, postural dysfunction, obesity, and pain.   ACTIVITY LIMITATIONS: carrying, lifting, bending, sitting, standing, squatting, sleeping, bathing, and locomotion level  PARTICIPATION LIMITATIONS: meal prep, cleaning, shopping, and community activity  REHAB POTENTIAL: Good  CLINICAL DECISION MAKING: Evolving/moderate complexity  EVALUATION COMPLEXITY: Moderate   GOALS: Goals reviewed with patient? No  SHORT TERM GOALS: Target date: 12/16/2023  patient will be independent with initial HEP  Baseline: Goal status: met  2.  Patient will report 30% improvement overall  Baseline:  Goal status: met   LONG TERM GOALS: Target date: 12/30/2023  Patient will be independent in self management strategies to improve quality of life and functional outcomes.  Baseline:  Goal status: in progress  2.  Patient will report 50% improvement overall  Baseline: 50 to 60% 01/24/24 Goal status:met  3.  Patient will improve 5 times sit to stand score to 15 sec or less to demonstrate improved functional mobility and increased leg strength.     Baseline: 31.38 sec; 12/27/23 16.31 sec; 8.88 sec Goal status: met  4.   Patient will increase bilateral leg MMT's to 5/5 to allow navigation of steps without gait deviation or loss of balance Baseline: see above Goal status: in progress   5.  Patient will be able to stand x 30 min to cook a meal without back pain >3/10 Baseline: 12/27/23 can stand 15 to 20 min;  01/24/24 still about 20 min pain 4/10 Goal status: in progress   PLAN:  PT FREQUENCY: 1x/week  PT DURATION: 4 weeks  PLANNED INTERVENTIONS: [id number]- PT Re-evaluation, 97110-Therapeutic exercises, 97530- Therapeutic activity, 97112- Neuromuscular re-education, 97535- Self Care, [id number]- Manual therapy, [id number]- Gait training, [phone]- Orthotic Fit/training, [phone]- Canalith repositioning, [id number]- Aquatic Therapy, 97760- Splinting, [id number]- Wound care (first 20 sq cm), 97598- Wound care (each additional 20 sq cm)Patient/Family education, Balance training, Stair training, Taping, Dry Needling, Joint mobilization, Joint manipulation, Spinal manipulation, Spinal mobilization, Scar mobilization, and DME instructions. SABRA  PLAN FOR NEXT SESSION:  continue to progress core stabilization, postural strengthening and reduce pain and dysfunction.  Patient will benefit from continued skilled therapy services to address remaining unmet and partially met goals but will decrease to 1 x a week working towards discharge.  Reassess next session.   Augustin Mclean, LPTA/CLT; CBIS [phone]  1:32 PM, 02/12/24

## 2024-02-14 ENCOUNTER — Ambulatory Visit (INDEPENDENT_AMBULATORY_CARE_PROVIDER_SITE_OTHER): Payer: Self-pay

## 2024-02-14 DIAGNOSIS — J309 Allergic rhinitis, unspecified: Secondary | ICD-10-CM

## 2024-02-18 DIAGNOSIS — D509 Iron deficiency anemia, unspecified: Secondary | ICD-10-CM | POA: Diagnosis not present

## 2024-02-18 DIAGNOSIS — E782 Mixed hyperlipidemia: Secondary | ICD-10-CM | POA: Diagnosis not present

## 2024-02-18 DIAGNOSIS — R7303 Prediabetes: Secondary | ICD-10-CM | POA: Diagnosis not present

## 2024-02-18 DIAGNOSIS — E790 Hyperuricemia without signs of inflammatory arthritis and tophaceous disease: Secondary | ICD-10-CM | POA: Diagnosis not present

## 2024-02-19 ENCOUNTER — Ambulatory Visit (HOSPITAL_COMMUNITY): Attending: Orthopedic Surgery

## 2024-02-19 ENCOUNTER — Encounter (HOSPITAL_COMMUNITY): Payer: Self-pay

## 2024-02-19 DIAGNOSIS — M545 Low back pain, unspecified: Secondary | ICD-10-CM | POA: Diagnosis not present

## 2024-02-19 DIAGNOSIS — R29898 Other symptoms and signs involving the musculoskeletal system: Secondary | ICD-10-CM | POA: Insufficient documentation

## 2024-02-19 DIAGNOSIS — M6281 Muscle weakness (generalized): Secondary | ICD-10-CM | POA: Diagnosis not present

## 2024-02-19 NOTE — Therapy (Signed)
 OUTPATIENT PHYSICAL THERAPY THORACOLUMBAR TREATMENT  Progress Note Reporting Period 12/02/23 to 02/19/24  See note below for Objective Data and Assessment of Progress/Goals.    Patient Name: Kendra Orozco MRN: [id number] DOB:11-Nov-1945, 78 y.o., female Today's Date: 02/19/2024  END OF SESSION:  PT End of Session - 02/19/24 1332     Visit Number 17    Number of Visits 17    Date for Recertification  02/21/24    Authorization Type HTA    Authorization Time Period no auth needed    PT Start Time 1332    PT Stop Time 1415    PT Time Calculation (min) 43 min    Activity Tolerance Patient tolerated treatment well    Behavior During Therapy WFL for tasks assessed/performed                Past Medical History:  Diagnosis Date   Angio-edema    Anxiety    Arthritis    Asthma    Cancer (HCC)    skin and colon   GERD (gastroesophageal reflux disease)    Gout    Hypertension    Urticaria    Past Surgical History:  Procedure Laterality Date   ABDOMINAL HYSTERECTOMY     fibroid tumors   APPENDECTOMY     BACK SURGERY     BIOPSY  01/08/2022   Procedure: BIOPSY;  Surgeon: Shaaron Lamar HERO, MD;  [facility]: [facility];  Service: Endoscopy;;   BREAST CYST EXCISION Right    CATARACT EXTRACTION W/PHACO Right 11/20/2019   Procedure: CATARACT EXTRACTION PHACO AND INTRAOCULAR LENS PLACEMENT (IOC) RIGHT EYE;  Surgeon: Harrie Agent, MD;  Location: [facility];  Service: Ophthalmology;  Laterality: Right;  CDE: 17.30   CATARACT EXTRACTION W/PHACO Left 12/04/2019   Procedure: CATARACT EXTRACTION PHACO AND INTRAOCULAR LENS PLACEMENT LEFT EYE;  Surgeon: Harrie Agent, MD;  Location: [facility];  Service: Ophthalmology;  Laterality: Left;  CDE: 14.59   COLONOSCOPY  09/16/2007   MFM:Jwjo canal hemorrhoids, otherwise normal rectum left-sided diverticula and colonic mucosa appeared normal.   COLONOSCOPY N/A 03/02/2013   Dr. Rolfe adenoma. Colonic diverticulosis. Anal canal  hemorrhoids-likely source of hematochezia. Surveillance 2019   COLONOSCOPY WITH PROPOFOL  N/A 07/29/2017   Non-bleeding internal hemorrhoids, cecal AVMs.    COLONOSCOPY WITH PROPOFOL  N/A 03/23/2022   Procedure: COLONOSCOPY WITH PROPOFOL ;  Surgeon: Shaaron Lamar HERO, MD;  Location: [facility];  Service: Endoscopy;  Laterality: N/A;  9:00 am   ESOPHAGOGASTRODUODENOSCOPY  09/16/2007   MFM:Jrrzwuljuzi undulating Z-line, tiny distal esophageal  erosions consistent with mild erosive reflux esophagitis, patulous EG junction, small hiatal hernia, otherwise normal stomach, D1, and D2.   ESOPHAGOGASTRODUODENOSCOPY (EGD) WITH PROPOFOL  N/A 01/08/2022   Procedure: ESOPHAGOGASTRODUODENOSCOPY (EGD) WITH PROPOFOL ;  Surgeon: Shaaron Lamar HERO, MD;  [facility];  Service: Endoscopy;  Laterality: N/A;  2:30 pm   HEMORRHOID SURGERY     X 2    MALONEY DILATION N/A 01/08/2022   Procedure: MALONEY DILATION;  Surgeon: Shaaron Lamar HERO, MD;  [facility]: [facility];  Service: Endoscopy;  Laterality: N/A;   POLYPECTOMY  03/23/2022   Procedure: POLYPECTOMY;  Surgeon: Shaaron Lamar HERO, MD;  [facility]: [facility];  Service: Endoscopy;;   Patient Active Problem List   Diagnosis Date Noted   Chronic fatigue 10/10/2023   Chronic gouty arthritis 10/10/2023   Skin sensation disturbance 10/10/2023   Fibromyalgia 10/10/2023   Acute recurrent maxillary sinusitis 09/21/2023   Sensorineural hearing loss, bilateral 09/21/2023  Impacted cerumen of both ears 09/21/2023   Pain in right hip 08/22/2023   Candidal intertrigo 06/24/2023   Arthralgia of right ankle 02/21/2023   Tinnitus of both ears 01/30/2023   Chronic rhinitis 01/30/2023   Deviated nasal septum 01/30/2023   Hypertrophy of nasal turbinates 01/30/2023   Morbid obesity (HCC) 08/22/2022   Prediabetes 08/22/2022   Edema 02/21/2022   Hypokalemia 02/21/2022   IDA (iron deficiency anemia) 02/20/2022   Elevated alkaline phosphatase level 02/20/2022   Impaired  fasting glucose 02/20/2022   Dysphagia 12/14/2021   Arthralgia of right knee 11/08/2021   Hidradenitis suppurativa of anus 11/08/2021   Right upper quadrant pain 11/08/2021   Stage 3a chronic kidney disease (HCC) 11/08/2021   Nausea 06/09/2021   Polymyalgia rheumatica 12/22/2020   Gout 12/15/2020   Acute pharyngitis 10/20/2020   Dyspnea 10/20/2020   Sneezing 10/20/2020   Cough 10/05/2020   Abnormal white blood cell (WBC) count 09/15/2020   Chronic low back pain 09/15/2020   External hemorrhoids 09/15/2020   Generalized anxiety disorder 09/15/2020   Generalized osteoarthritis 09/15/2020   Mild persistent asthma 09/15/2020   Mixed hyperlipidemia 09/15/2020   Obesity 09/15/2020   Allergic rhinitis 09/15/2020   Hyponatremia 06/06/2020   COVID-19 virus infection 06/06/2020   HTN (hypertension) 06/06/2020   Prolapsed internal hemorrhoids, grade 3 05/12/2020   Constipation 04/15/2013   Rectal bleeding 02/11/2013   Gastroesophageal reflux disease 02/11/2013    PCP: Shona Norleen PEDLAR, MD  REFERRING PROVIDER: Margrette Taft BRAVO, MD  REFERRING DIAG: M54.50,G89.29 (ICD-10-CM) - Chronic low back pain, unspecified back pain laterality, unspecified whether sciatica present  Rationale for Evaluation and Treatment: Rehabilitation  THERAPY DIAG:  Other symptoms and signs involving the musculoskeletal system  Muscle weakness (generalized)  Low back pain, unspecified back pain laterality, unspecified chronicity, unspecified whether sciatica present  ONSET DATE: chronic back pain  SUBJECTIVE:                                                                                                                                                                                           SUBJECTIVE STATEMENT: Reports fibromyalgia pain today, pain scale 5/10 shoulder, back and Rt ankle.  Has put cream on these areas that helped and has wrapped up Rt ankle that helps with walking.  Feels she has improved  by 75% since beginning therapy.  Has been doing exercises and walking 3x a week.   Evaluation: History of back surgery years ago; chronic back pain.  Went to Dr. Margrette about her back; he took x-rays and referred to therapy.  History of right side weakness from the back and then has  had some right ankle issues; fx and multiple sprains; also states she has some leg weakness and perhaps varicose veins; difficulty standing more than 15 min at a time.    PERTINENT HISTORY:  L4-L5 back surgery 1984  Hemorrhoids History of gout  Allergy  shots/asthma   PAIN:  Are you having pain? Yes: NPRS scale: 2-3/10; 9-10/10 with prolonged standing Pain location: low back Pain description: sore and radiating  Aggravating factors: prolonged standing, prolonged sitting Relieving factors: muscle rub, sitting down and rest, walking  PRECAUTIONS: None   WEIGHT BEARING RESTRICTIONS: No  FALLS:  Has patient fallen in last 6 months? No  OCCUPATION: retired careers adviser  PLOF: Independent  PATIENT GOALS: get stronger and be able to stand more than 15 min at at time  NEXT MD VISIT: PRN  OBJECTIVE:  Note: Objective measures were completed at Evaluation unless otherwise noted.  DIAGNOSTIC FINDINGS:  Lower back pain history of L4-5 surgery   5 views of the spine.  Coronal and sagittal plane abnormal alignment.  Facet arthritis grade 2 spondylo at L4-L5 or L5-S1, difficult to tell spot films suggested L5-S1   Impression grade 2 spondylolisthesis chronic degenerative changes in the facet joints spondylosis    PATIENT SURVEYS:  Modified Oswestry:  MODIFIED OSWESTRY DISABILITY SCALE  Date: 12/02/2023 Score  Total 22/50; 44%  Modified Oswestry:  MODIFIED OSWESTRY DISABILITY SCALE  Date: 02/19/24 Score  Pain intensity 3 =  Pain medication provides me with moderate relief from pain.  2. Personal care (washing, dressing, etc.) 0 =  I can take care of myself normally without causing increased pain.   3. Lifting 3 = Pain prevents me from lifting heavy weights, but I can manage light to medium weights if they are conveniently positioned  4. Walking 0 = Pain does not prevent me from walking any distance  5. Sitting 0 =  I can sit in any chair as long as I like.  6. Standing 3 =  Pain prevents me from standing more than 1/2 hour.  7. Sleeping 1 = I can sleep well only by using pain medication.  8. Social Life 2 = Pain prevents me from participating in more energetic activities (eg. sports, dancing).  9. Traveling 1 =  I can travel anywhere, but it increases my pain.  10. Employment/ Homemaking 1 = My normal homemaking/job activities increase my pain, but I can still perform all that is required of me  Total 14/50= 28%   Interpretation of scores: Score Category Description  0-20% Minimal Disability The patient can cope with most living activities. Usually no treatment is indicated apart from advice on lifting, sitting and exercise  21-40% Moderate Disability The patient experiences more pain and difficulty with sitting, lifting and standing. Travel and social life are more difficult and they may be disabled from work. Personal care, sexual activity and sleeping are not grossly affected, and the patient can usually be managed by conservative means  41-60% Severe Disability Pain remains the main problem in this group, but activities of daily living are affected. These patients require a detailed investigation  61-80% Crippled Back pain impinges on all aspects of the patient's life. Positive intervention is required  81-100% Bed-bound These patients are either bed-bound or exaggerating their symptoms  Bluford FORBES Zoe DELENA Karon DELENA, et al. Surgery versus conservative management of stable thoracolumbar fracture: the PRESTO feasibility RCT. [place] ([place]): [employer]; 2021 Nov. [facility] Technology Assessment, No. 25.62.) Appendix 3, Oswestry Disability Index category descriptors.  Available  from: Findjewelers.cz  Minimally Clinically Important Difference (MCID) = 12.8%  COGNITION: Overall cognitive status: Within functional limits for tasks assessed     SENSATION: Pain down right leg; numbness down right leg    POSTURE: rounded shoulders and forward head  PALPATION: General soreness lumbar paraspinals right side glutes and piriformis  LUMBAR ROM: *pain  AROM eval 12/27/23 01/24/24  Flexion Fingertips to 1 above ankle* low back Fingertips to ankle Fingertips 1 from toes  Extension 20% available* more painful than forwards 30% available 35% available Pulling  Right lateral flexion Fingertips to knee joint line    Left lateral flexion Fingertips to knee joint line    Right rotation     Left rotation      (Blank rows = not tested)  LOWER EXTREMITY MMT:    MMT Right eval Left eval Right 12/27/23 Left 12/27/23 Right 01/24/24 Left 01/24/24 Right 02/19/24 Left 02/19/24  Hip flexion 4- 4+ 4+ 4+ 4+ 4+ 5 4+   Hip extension 4- 4- 4+ 4 4+ 4 4 4   Hip abduction       4+ 4+  Hip adduction          Hip internal rotation          Hip external rotation          Knee flexion 4 4 4+ 4+ 4+ 5 5 5   Knee extension 4 4+ 5 5 5 5 5 5   Ankle dorsiflexion 4 4+ 5 5 5 5 5 5   Ankle plantarflexion          Ankle inversion          Ankle eversion           (Blank rows = not tested)  FUNCTIONAL TESTS:  5 times sit to stand: 31.38 sec no UE assist : 485ft no AD, WNL  GAIT: Distance walked: 60 ft in clinic Assistive device utilized: None Level of assistance: Complete Independence Comments: decreased gait speed  TREATMENT DATE: 02/19/24: Nustep, level 5 resistance, seat 7 UE/LE, 5 minutes; [place]; average SPM 77 Discussed goals during nustep 472ft no AD no increased pain MMT Oswestry improved from 44% to 28%. Discussed HEP to continue at home.  02/12/24: Nustep, level 5 resistance, seat 7 UE/LE, 5 minutes; [place]  trail; average SPM 77 3D hip excursion ( STS, lumbar extension, rotation and sidebend) 10x Toe tapping 6in step height intermittent 1 HHA 2x 10 Lateal toe tapping standing between 6 and 8 in step 15x March on foot with 2# on foot DF 10x 5 intermittent HHA  02/05/24 Nustep seat 7 UE/LE x 5 minutes level 3 dynamic warm up Thoracic extension over chair 10 x 3hold Thoracic and lumbar rotation in chair x 1 caused a cramp so discontinued Standing lumbar extension over bar x 10 Standing heel raises on incline 2 x 10 Standing toe raises on decline 2 x 10 Tandem stance 2 x 30 Hamstring stretch in sitting 3 x 20 each SLS 3 x 10 each Slant board 2 x 20   01/31/24 Nustep, seat 7 UE/LE, 5 minutes; dynamic warm up Heel raises on incline 2 x 10 Toe raises on decline 2 x 10 Hip vectors x 5 each side Seated hamstring stretch 5 x 20 Seated thoracic extension x 10   01/24/24 Nustep, level 4 resistance, seat 7 UE/LE, 5 minutes; dynamic warm up Progress note Modified Oswestry 16/50 32% 5 times sit to stand 8.88 sec no UE assist AROM and  MMT's see above Goal review Standing hip extension 2 hold x 10 each   01/21/24 Nustep, level 4 resistance, seat 7 UE/LE, 6 minutes Standing:  GTB rows 2X10  GTB shoulder extensions 2X10  Pallof GTB 2X10 facing Rt only (unable to do Lt) Lateral step up and overs 6 inch step, no ankle weights 10X each 1 UE assist 3 way hip in parallel bars, 1 set of 5 reps bilateral, 3 holds no weights Walking marches high lift/walking bwds, 20 foot line, no ankle weights, 2 laps, pt cued for max LE ROM   01/14/24 Nustep, level 4 resistance, seat 7 UE/LE, 6 minutes Standing:  GTB rows 2X10  GTB shoulder extensions 2X10  Pallof GTB 2X10  Lateral step up and overs 6 inch step, 2# ankle weights 3 way hip in parallel bars, 2 set of 5 reps bilateral, 3 holds 2# weights Walking marches/walking bwds, 20 foot line, 2# ankle weights, 2 laps, pt cued for max LE  ROM  01/08/2024  Therapeutic Exercise: -Nustep, level 4 resistance, pt cued for 70 SPM -Lateral step up and overs 6 inch step, 3lb ankle weights -Standing 3 way hip in parallel bars, 1 set of 5 reps bilateral, 3lb weights -Lat pull downs, 1 set of 8 reps, plate 3, pt cued for eccentric control Neuromuscular Re-education: -Walking marches/walking bwds, 20 foot line, 3lb ankle weights, 2 laps, pt cued for max LE ROM -Pallof press with GTB at chest level, 1 set of 10 reps bilaterally, pt cued for sequencing  -Bird dogs, 1 set of 4 reps, bilaterally pt cued for sequencing and core activation     PATIENT EDUCATION:  Education details: Patient educated on exam findings, POC, scope of PT, HEP, and what to expect next visit. Person educated: Patient Education method: Explanation, Demonstration, and Handouts Education comprehension: verbalized understanding, returned demonstration, verbal cues required, and tactile cues required  HOME EXERCISE PROGRAM:  Access Code: [email or web address] URL: [email or web address] Date: 12/02/2023 Prepared by: [facility] - Rehab Exercises - Supine Posterior Pelvic Tilt  - 2 x daily - 7 x weekly - 1 sets - 10 reps - 3 sec hold   -Decompression exercises 1-5   Access Code: [email or web address] URL: [email or web address] Date: 12/05/2023 Prepared by: Greig Fuse Exercises - Seated Hamstring Stretch with Chair  - 2 x daily - 7 x weekly - 3 reps - 30 sec hold -hip excursions 3 motions 10X each (no bouncing in stretch)  02/12/24: - Standing Tandem Balance with Counter Support  - 2 x daily - 7 x weekly - 1 sets - 3 reps - 30 hold - Single Leg Stance  - 2 x daily - 7 x weekly - 1 sets - 3 reps - 30 hold  ASSESSMENT:  CLINICAL IMPRESSION: Progress note with the following findings:  Pt has met 2/2 STGs 3/5 LTGs met and 2/5 LTG partially met.  Pt compliant with HEP and feels she has improved by 75% since beginning therapy.  Presents with ability to stand for  30 minutes with intermittent movements but able to do with pain less than 3/10.  Improved LE strength and self perceived functional abilities noted in Oswestry survey.  Educated to continue HEP following discharge wiht verbalized understanding.  DC to HEP.  OBJECTIVE IMPAIRMENTS: Abnormal gait, decreased mobility, decreased ROM, decreased strength, impaired perceived functional ability, postural dysfunction, obesity, and pain.   ACTIVITY LIMITATIONS: carrying, lifting, bending, sitting, standing, squatting, sleeping, bathing, and locomotion level  PARTICIPATION LIMITATIONS: meal prep, cleaning, shopping, and community activity  REHAB POTENTIAL: Good  CLINICAL DECISION MAKING: Evolving/moderate complexity  EVALUATION COMPLEXITY: Moderate   GOALS: Goals reviewed with patient? No  SHORT TERM GOALS: Target date: 12/16/2023  patient will be independent with initial HEP  Baseline:  Feels she has improved by 75% since beginning therapy.  Has been doing exercises 3x a week. Goal status: met  2.  Patient will report 30% improvement overall  Baseline: Feels she has improved by 75% since beginning therapy.  Has been doing exercises 3x a week.  Goal status: met   LONG TERM GOALS: Target date: 12/30/2023  Patient will be independent in self management strategies to improve quality of life and functional outcomes.  Baseline:  Goal status: MET  2.  Patient will report 50% improvement overall  Baseline: 50 to 60% 01/24/24; 02/19/24 Feels she has improved by 75% since beginning therapy.    Goal status:met  3.  Patient will improve 5 times sit to stand score to 15 sec or less to demonstrate improved functional mobility and increased leg strength.     Baseline: 31.38 sec; 12/27/23 16.31 sec; 8.88 sec Goal status: met  4.   Patient will increase bilateral leg MMT's to 5/5 to allow navigation of steps without gait deviation or loss of balance Baseline: see above Goal status: Partially  met  5.  Patient will be able to stand x 30 min to cook a meal without back pain >3/10 Baseline: 12/27/23 can stand 15 to 20 min; 01/24/24 still about 20 min pain 4/10; 02/19/24: 65% better, can stance for 30 min but not still without increased pain. Goal status: Partially met   PLAN:  PT FREQUENCY: 1x/week  PT DURATION: 4 weeks  PLANNED INTERVENTIONS: 97164- PT Re-evaluation, 97110-Therapeutic exercises, 97530- Therapeutic activity, [id number]- Neuromuscular re-education, 97535- Self Care, 02859- Manual therapy, [phone]- Gait training, [phone]- Orthotic Fit/training, [phone]- Canalith repositioning, [id number]- Aquatic Therapy, [phone]- Splinting, [phone]- Wound care (first 20 sq cm), 97598- Wound care (each additional 20 sq cm)Patient/Family education, Balance training, Stair training, Taping, Dry Needling, Joint mobilization, Joint manipulation, Spinal manipulation, Spinal mobilization, Scar mobilization, and DME instructions.   PLAN FOR NEXT SESSION:  DC to HEP   Augustin Mclean, LPTA/CLT; CBIS [phone]  4:47 PM, 02/19/24

## 2024-02-24 ENCOUNTER — Other Ambulatory Visit (HOSPITAL_COMMUNITY): Payer: Self-pay | Admitting: Internal Medicine

## 2024-02-24 DIAGNOSIS — Z13820 Encounter for screening for osteoporosis: Secondary | ICD-10-CM

## 2024-02-24 DIAGNOSIS — M25561 Pain in right knee: Secondary | ICD-10-CM | POA: Diagnosis not present

## 2024-02-24 DIAGNOSIS — J4530 Mild persistent asthma, uncomplicated: Secondary | ICD-10-CM | POA: Diagnosis not present

## 2024-02-24 DIAGNOSIS — N1831 Chronic kidney disease, stage 3a: Secondary | ICD-10-CM | POA: Diagnosis not present

## 2024-02-24 DIAGNOSIS — I10 Essential (primary) hypertension: Secondary | ICD-10-CM | POA: Diagnosis not present

## 2024-02-24 DIAGNOSIS — R5382 Chronic fatigue, unspecified: Secondary | ICD-10-CM | POA: Diagnosis not present

## 2024-02-24 DIAGNOSIS — G8929 Other chronic pain: Secondary | ICD-10-CM | POA: Diagnosis not present

## 2024-02-24 DIAGNOSIS — M545 Low back pain, unspecified: Secondary | ICD-10-CM | POA: Diagnosis not present

## 2024-02-24 DIAGNOSIS — H903 Sensorineural hearing loss, bilateral: Secondary | ICD-10-CM | POA: Diagnosis not present

## 2024-02-24 DIAGNOSIS — M25571 Pain in right ankle and joints of right foot: Secondary | ICD-10-CM | POA: Diagnosis not present

## 2024-02-24 DIAGNOSIS — I129 Hypertensive chronic kidney disease with stage 1 through stage 4 chronic kidney disease, or unspecified chronic kidney disease: Secondary | ICD-10-CM | POA: Diagnosis not present

## 2024-02-24 DIAGNOSIS — Z23 Encounter for immunization: Secondary | ICD-10-CM | POA: Diagnosis not present

## 2024-02-24 DIAGNOSIS — F411 Generalized anxiety disorder: Secondary | ICD-10-CM | POA: Diagnosis not present

## 2024-02-25 ENCOUNTER — Other Ambulatory Visit (HOSPITAL_COMMUNITY): Payer: Self-pay | Admitting: Internal Medicine

## 2024-02-25 DIAGNOSIS — Z1231 Encounter for screening mammogram for malignant neoplasm of breast: Secondary | ICD-10-CM

## 2024-03-11 ENCOUNTER — Ambulatory Visit (HOSPITAL_COMMUNITY)
Admission: RE | Admit: 2024-03-11 | Discharge: 2024-03-11 | Disposition: A | Discharge status: Home / Self Care | Source: Ambulatory Visit | Attending: Internal Medicine | Admitting: Internal Medicine

## 2024-03-11 ENCOUNTER — Encounter (HOSPITAL_COMMUNITY): Payer: Self-pay

## 2024-03-11 DIAGNOSIS — Z13820 Encounter for screening for osteoporosis: Secondary | ICD-10-CM | POA: Diagnosis not present

## 2024-03-11 DIAGNOSIS — M810 Age-related osteoporosis without current pathological fracture: Secondary | ICD-10-CM | POA: Insufficient documentation

## 2024-03-11 DIAGNOSIS — Z1231 Encounter for screening mammogram for malignant neoplasm of breast: Secondary | ICD-10-CM | POA: Diagnosis not present

## 2024-03-11 DIAGNOSIS — Z780 Asymptomatic menopausal state: Secondary | ICD-10-CM | POA: Insufficient documentation

## 2024-03-19 ENCOUNTER — Encounter (INDEPENDENT_AMBULATORY_CARE_PROVIDER_SITE_OTHER): Payer: Self-pay | Admitting: Otolaryngology

## 2024-03-19 ENCOUNTER — Ambulatory Visit (INDEPENDENT_AMBULATORY_CARE_PROVIDER_SITE_OTHER): Admitting: Otolaryngology

## 2024-03-19 VITALS — BP 109/74 | HR 90 | Temp 98.2°F | Ht 63.5 in | Wt 214.0 lb

## 2024-03-19 DIAGNOSIS — J342 Deviated nasal septum: Secondary | ICD-10-CM

## 2024-03-19 DIAGNOSIS — J328 Other chronic sinusitis: Secondary | ICD-10-CM | POA: Diagnosis not present

## 2024-03-19 DIAGNOSIS — H903 Sensorineural hearing loss, bilateral: Secondary | ICD-10-CM

## 2024-03-19 DIAGNOSIS — J343 Hypertrophy of nasal turbinates: Secondary | ICD-10-CM

## 2024-03-19 DIAGNOSIS — J309 Allergic rhinitis, unspecified: Secondary | ICD-10-CM | POA: Diagnosis not present

## 2024-03-19 DIAGNOSIS — H6123 Impacted cerumen, bilateral: Secondary | ICD-10-CM | POA: Diagnosis not present

## 2024-03-19 DIAGNOSIS — J310 Chronic rhinitis: Secondary | ICD-10-CM

## 2024-03-19 NOTE — Progress Notes (Unsigned)
 Patient ID: Kendra Orozco, female   DOB: Oct 28, 1945, 78 y.o.   MRN: [id number]  Follow up: Chronic nasal congestion, recurrent sinusitis, hearing loss, tinnitus  History of Present Illness Kendra Orozco is a 78 year old female who returns today for her follow-up evaluation.  She has a history of recurrent sinusitis, chronic nasal congestion, bilateral hearing loss and tinnitus.  She has experienced two sinus infections in the last six months, seeking treatment at urgent care and from her primary care physician. She has been receiving allergy  shots but has missed the last few weeks due to infections, with the last shot administered about three weeks ago. She continues to use Flonase for nasal congestion.  She describes increased bronchial congestion and mucus production, particularly when lying down, which causes her to cough. The mucus feels like 'thickening' and is not relieved by saliva. She uses Mucinex  to help with the mucus, which she finds beneficial. She also uses Atrovent  and a budesonide  inhaler, primarily at night, to manage her symptoms.  She has a history of acid reflux, managed with medication twice a day, including omeprazole  and other reflux medications prescribed by her gastroenterologist. Reflux symptoms worsen when lying down, causing throat irritation and coughing. She has undergone surgeries on polyps in her throat in the past.  She continues to have bilateral hearing loss and bilateral tinnitus.  She denies any recent change in her hearing.  Currently she denies any otalgia, otorrhea, or vertigo.  Exam: General: Communicates without difficulty, well nourished, no acute distress. Head: Normocephalic, no evidence injury, no tenderness, facial buttresses intact without stepoff. Face/sinus: No tenderness to palpation and percussion. Facial movement is normal and symmetric. Eyes: PERRL, EOMI. No scleral icterus, conjunctivae clear. Neuro: CN II exam reveals vision grossly intact.   No nystagmus at any point of gaze. Ears: Auricles well formed without lesions.  Bilateral cerumen impaction.  Nose: External evaluation reveals normal support and skin without lesions.  Dorsum is intact.  Anterior rhinoscopy reveals congested mucosa over anterior aspect of inferior turbinates and deviated septum.  No purulence noted. Oral:  Oral cavity and oropharynx are intact, symmetric, without erythema or edema.  Mucosa is moist without lesions. Neck: Full range of motion without pain.  There is no significant lymphadenopathy.  No masses palpable.  Thyroid bed within normal limits to palpation.  Parotid glands and submandibular glands equal bilaterally without mass.  Trachea is midline. Neuro:  CN 2-12 grossly intact.   Procedure: Bilateral cerumen disimpaction Anesthesia: None Description: Under the operating microscope, the cerumen is carefully removed with a combination of cerumen currette, alligator forceps, and suction catheters.  After the cerumen is removed, the TMs are noted to be normal.  No mass, erythema, or lesions. The patient tolerated the procedure well.   Assessment & Plan Recurrent sinusitis with allergic rhinitis, nasal septal deviation, and bilateral inferior turbinate hypertrophy. Recurrent sinusitis with two episodes in the last six months. Persistent nasal congestion and post-nasal drip, likely exacerbated by allergic rhinitis and nasal structural changes including deviated septum and inferior turbinate hypertrophy. No current signs of infection.  - Continue Flonase nasal spray. - Resume allergy  shots as soon as possible. - Continue saline irrigation. - Continue Atrovent  for drainage reduction. - Use budesonide  inhaler, particularly at night. - Follow up with GI doctor for reflux management if symptoms persist.  Subjectively stable bilateral high-frequency sensorineural hearing loss and tinnitus - Continue the use of her hearing aids.  Bilateral cerumen  impaction -Otomicroscopy with bilateral cerumen  disimpaction. - Advised against inserting objects into the ear canal.

## 2024-03-20 ENCOUNTER — Ambulatory Visit

## 2024-03-20 DIAGNOSIS — J309 Allergic rhinitis, unspecified: Secondary | ICD-10-CM | POA: Diagnosis not present

## 2024-03-27 ENCOUNTER — Ambulatory Visit (INDEPENDENT_AMBULATORY_CARE_PROVIDER_SITE_OTHER)

## 2024-03-27 DIAGNOSIS — J302 Other seasonal allergic rhinitis: Secondary | ICD-10-CM | POA: Diagnosis not present

## 2024-03-27 DIAGNOSIS — J309 Allergic rhinitis, unspecified: Secondary | ICD-10-CM

## 2024-04-02 ENCOUNTER — Other Ambulatory Visit: Payer: Self-pay | Admitting: Internal Medicine

## 2024-04-03 ENCOUNTER — Ambulatory Visit (INDEPENDENT_AMBULATORY_CARE_PROVIDER_SITE_OTHER)

## 2024-04-03 DIAGNOSIS — J302 Other seasonal allergic rhinitis: Secondary | ICD-10-CM | POA: Diagnosis not present

## 2024-04-03 DIAGNOSIS — J309 Allergic rhinitis, unspecified: Secondary | ICD-10-CM

## 2024-04-22 ENCOUNTER — Ambulatory Visit

## 2024-04-22 DIAGNOSIS — J302 Other seasonal allergic rhinitis: Secondary | ICD-10-CM

## 2024-04-23 ENCOUNTER — Other Ambulatory Visit: Payer: Self-pay | Admitting: Allergy & Immunology

## 2024-04-23 ENCOUNTER — Encounter: Payer: Self-pay | Admitting: *Deleted

## 2024-04-23 NOTE — Progress Notes (Signed)
 Kendra Orozco                                          MRN: [id number]   04/23/2024   The [facility] Quality Team Specialist reviewed this patient medical record for the purposes of chart review for care gap closure. The following were reviewed: abstraction for care gap closure-controlling blood pressure.    [facility] Quality Team

## 2024-05-06 ENCOUNTER — Ambulatory Visit

## 2024-05-06 DIAGNOSIS — J302 Other seasonal allergic rhinitis: Secondary | ICD-10-CM | POA: Diagnosis not present

## 2024-05-22 ENCOUNTER — Ambulatory Visit

## 2024-05-22 DIAGNOSIS — J302 Other seasonal allergic rhinitis: Secondary | ICD-10-CM

## 2024-05-29 ENCOUNTER — Ambulatory Visit: Admitting: Allergy & Immunology

## 2024-09-17 ENCOUNTER — Ambulatory Visit (INDEPENDENT_AMBULATORY_CARE_PROVIDER_SITE_OTHER): Admitting: Otolaryngology
